# Patient Record
Sex: Female | Born: 1970 | Race: Black or African American | Hispanic: No | Smoking: Never smoker
Health system: Southern US, Community
[De-identification: ages and names within clinical notes are randomized; demographics above are authoritative.]

## PROBLEM LIST (undated history)

## (undated) DIAGNOSIS — E079 Disorder of thyroid, unspecified: Secondary | ICD-10-CM

## (undated) DIAGNOSIS — T7840XA Allergy, unspecified, initial encounter: Secondary | ICD-10-CM

## (undated) DIAGNOSIS — K219 Gastro-esophageal reflux disease without esophagitis: Secondary | ICD-10-CM

## (undated) DIAGNOSIS — I10 Essential (primary) hypertension: Secondary | ICD-10-CM

## (undated) DIAGNOSIS — Z460 Encounter for fitting and adjustment of spectacles and contact lenses: Secondary | ICD-10-CM

## (undated) DIAGNOSIS — M1990 Unspecified osteoarthritis, unspecified site: Secondary | ICD-10-CM

## (undated) HISTORY — DX: Essential (primary) hypertension: I10

## (undated) HISTORY — PX: BREAST BIOPSY: SHX20

## (undated) HISTORY — DX: Unspecified osteoarthritis, unspecified site: M19.90

## (undated) HISTORY — DX: Encounter for fitting and adjustment of spectacles and contact lenses: Z46.0

## (undated) HISTORY — DX: Disorder of thyroid, unspecified: E07.9

## (undated) HISTORY — DX: Gastro-esophageal reflux disease without esophagitis: K21.9

## (undated) HISTORY — DX: Allergy, unspecified, initial encounter: T78.40XA

## (undated) HISTORY — PX: OTHER SURGICAL HISTORY: SHX169

---

## 1997-12-07 ENCOUNTER — Inpatient Hospital Stay (HOSPITAL_COMMUNITY): Admission: AD | Admit: 1997-12-07 | Discharge: 1997-12-10 | Payer: Self-pay | Admitting: Obstetrics and Gynecology

## 2000-09-11 ENCOUNTER — Encounter (INDEPENDENT_AMBULATORY_CARE_PROVIDER_SITE_OTHER): Payer: Self-pay | Admitting: *Deleted

## 2000-09-11 ENCOUNTER — Inpatient Hospital Stay (HOSPITAL_COMMUNITY): Admission: AD | Admit: 2000-09-11 | Discharge: 2000-09-13 | Payer: Self-pay | Admitting: Obstetrics and Gynecology

## 2001-02-06 ENCOUNTER — Other Ambulatory Visit: Admission: RE | Admit: 2001-02-06 | Discharge: 2001-02-06 | Payer: Self-pay | Admitting: Obstetrics and Gynecology

## 2002-03-01 ENCOUNTER — Other Ambulatory Visit: Admission: RE | Admit: 2002-03-01 | Discharge: 2002-03-01 | Payer: Self-pay | Admitting: Obstetrics and Gynecology

## 2003-02-28 ENCOUNTER — Other Ambulatory Visit: Admission: RE | Admit: 2003-02-28 | Discharge: 2003-02-28 | Payer: Self-pay | Admitting: Obstetrics and Gynecology

## 2003-06-09 ENCOUNTER — Other Ambulatory Visit: Admission: RE | Admit: 2003-06-09 | Discharge: 2003-06-09 | Payer: Self-pay | Admitting: Obstetrics and Gynecology

## 2003-10-13 ENCOUNTER — Inpatient Hospital Stay (HOSPITAL_COMMUNITY): Admission: AD | Admit: 2003-10-13 | Discharge: 2003-10-13 | Payer: Self-pay | Admitting: Obstetrics and Gynecology

## 2003-10-27 ENCOUNTER — Inpatient Hospital Stay (HOSPITAL_COMMUNITY): Admission: AD | Admit: 2003-10-27 | Discharge: 2003-10-27 | Payer: Self-pay | Admitting: Obstetrics and Gynecology

## 2003-10-28 ENCOUNTER — Inpatient Hospital Stay (HOSPITAL_COMMUNITY): Admission: AD | Admit: 2003-10-28 | Discharge: 2003-10-28 | Payer: Self-pay | Admitting: Obstetrics and Gynecology

## 2003-12-16 ENCOUNTER — Inpatient Hospital Stay (HOSPITAL_COMMUNITY): Admission: RE | Admit: 2003-12-16 | Discharge: 2003-12-19 | Payer: Self-pay | Admitting: Obstetrics and Gynecology

## 2004-01-30 ENCOUNTER — Other Ambulatory Visit: Admission: RE | Admit: 2004-01-30 | Discharge: 2004-01-30 | Payer: Self-pay | Admitting: Obstetrics and Gynecology

## 2005-12-19 ENCOUNTER — Ambulatory Visit: Payer: Self-pay | Admitting: Family Medicine

## 2006-01-22 ENCOUNTER — Ambulatory Visit: Payer: Self-pay | Admitting: Family Medicine

## 2006-01-22 LAB — CONVERTED CEMR LAB
BUN: 22 mg/dL (ref 6–23)
Calcium: 9.8 mg/dL (ref 8.4–10.5)
Chol/HDL Ratio, serum: 4.5
Creatinine, Ser: 1.2 mg/dL (ref 0.4–1.2)
Glomerular Filtration Rate, Af Am: 66 mL/min/{1.73_m2}
HCT: 37.8 % (ref 36.0–46.0)
LDL DIRECT: 219.3 mg/dL
Potassium: 4.7 meq/L (ref 3.5–5.1)
RDW: 14.3 % (ref 11.5–14.6)
TSH: >100 microintl units/mL — ABNORMAL HIGH (ref 0.35–5.50)
Triglyceride fasting, serum: 101 mg/dL (ref 0–149)
VLDL: 20 mg/dL (ref 0–40)
WBC: 5.3 10*3/uL (ref 4.5–10.5)

## 2006-01-30 ENCOUNTER — Ambulatory Visit: Payer: Self-pay | Admitting: Family Medicine

## 2006-01-30 LAB — CONVERTED CEMR LAB
Free T4: 0.3 ng/dL — ABNORMAL LOW (ref 0.9–1.8)
T3, Free: 2.6 pg/mL (ref 2.3–4.2)
TSH: >100 microintl units/mL — ABNORMAL HIGH (ref 0.35–5.50)

## 2006-02-17 ENCOUNTER — Ambulatory Visit: Payer: Self-pay | Admitting: Family Medicine

## 2006-02-17 LAB — CONVERTED CEMR LAB
Albumin: 4.1 g/dL (ref 3.5–5.2)
HDL: 59.9 mg/dL (ref >39.0)
Triglyceride fasting, serum: 102 mg/dL (ref 0–149)

## 2006-02-20 ENCOUNTER — Encounter: Admission: RE | Admit: 2006-02-20 | Discharge: 2006-02-20 | Payer: Self-pay | Admitting: Family Medicine

## 2006-03-31 ENCOUNTER — Ambulatory Visit: Payer: Self-pay | Admitting: Family Medicine

## 2006-05-09 ENCOUNTER — Ambulatory Visit: Payer: Self-pay | Admitting: Family Medicine

## 2006-05-09 LAB — CONVERTED CEMR LAB
Cholesterol: 191 mg/dL (ref 0–200)
Total CHOL/HDL Ratio: 4.3

## 2006-05-16 ENCOUNTER — Ambulatory Visit: Payer: Self-pay | Admitting: Internal Medicine

## 2006-08-13 DIAGNOSIS — I10 Essential (primary) hypertension: Secondary | ICD-10-CM

## 2006-09-03 ENCOUNTER — Ambulatory Visit: Payer: Self-pay | Admitting: Family Medicine

## 2006-09-03 DIAGNOSIS — E039 Hypothyroidism, unspecified: Secondary | ICD-10-CM

## 2006-09-03 DIAGNOSIS — J309 Allergic rhinitis, unspecified: Secondary | ICD-10-CM | POA: Insufficient documentation

## 2006-09-03 DIAGNOSIS — E669 Obesity, unspecified: Secondary | ICD-10-CM

## 2006-10-22 ENCOUNTER — Ambulatory Visit: Payer: Self-pay | Admitting: Family Medicine

## 2006-10-23 ENCOUNTER — Telehealth (INDEPENDENT_AMBULATORY_CARE_PROVIDER_SITE_OTHER): Payer: Self-pay | Admitting: *Deleted

## 2006-10-23 LAB — CONVERTED CEMR LAB
AST: 17 units/L (ref 0–37)
BUN: 12 mg/dL (ref 6–23)
CO2: 28 meq/L (ref 19–32)
Creatinine, Ser: 0.7 mg/dL (ref 0.4–1.2)
HDL: 52.3 mg/dL (ref >39.0)
Potassium: 3.9 meq/L (ref 3.5–5.1)
TSH: 5.26 microintl units/mL (ref 0.35–5.50)

## 2006-12-17 ENCOUNTER — Encounter (INDEPENDENT_AMBULATORY_CARE_PROVIDER_SITE_OTHER): Payer: Self-pay | Admitting: Family Medicine

## 2006-12-17 ENCOUNTER — Telehealth (INDEPENDENT_AMBULATORY_CARE_PROVIDER_SITE_OTHER): Payer: Self-pay | Admitting: *Deleted

## 2007-01-19 ENCOUNTER — Telehealth (INDEPENDENT_AMBULATORY_CARE_PROVIDER_SITE_OTHER): Payer: Self-pay | Admitting: *Deleted

## 2007-01-22 ENCOUNTER — Ambulatory Visit: Payer: Self-pay | Admitting: Family Medicine

## 2007-01-23 ENCOUNTER — Encounter (INDEPENDENT_AMBULATORY_CARE_PROVIDER_SITE_OTHER): Payer: Self-pay | Admitting: Family Medicine

## 2007-01-25 LAB — CONVERTED CEMR LAB
CO2: 29 meq/L (ref 19–32)
Chloride: 102 meq/L (ref 96–112)
Creatinine, Ser: 0.9 mg/dL (ref 0.4–1.2)
Glucose, Bld: 88 mg/dL (ref 70–99)
Sodium: 138 meq/L (ref 135–145)

## 2007-01-26 ENCOUNTER — Encounter (INDEPENDENT_AMBULATORY_CARE_PROVIDER_SITE_OTHER): Payer: Self-pay | Admitting: *Deleted

## 2007-01-26 ENCOUNTER — Telehealth (INDEPENDENT_AMBULATORY_CARE_PROVIDER_SITE_OTHER): Payer: Self-pay | Admitting: *Deleted

## 2007-02-03 ENCOUNTER — Encounter (INDEPENDENT_AMBULATORY_CARE_PROVIDER_SITE_OTHER): Payer: Self-pay | Admitting: Family Medicine

## 2007-02-18 ENCOUNTER — Telehealth (INDEPENDENT_AMBULATORY_CARE_PROVIDER_SITE_OTHER): Payer: Self-pay | Admitting: *Deleted

## 2007-02-20 ENCOUNTER — Encounter: Admission: RE | Admit: 2007-02-20 | Discharge: 2007-05-21 | Payer: Self-pay | Admitting: Surgery

## 2007-02-20 ENCOUNTER — Ambulatory Visit (HOSPITAL_COMMUNITY): Admission: RE | Admit: 2007-02-20 | Discharge: 2007-02-20 | Payer: Self-pay | Admitting: Surgery

## 2007-03-01 ENCOUNTER — Ambulatory Visit (HOSPITAL_BASED_OUTPATIENT_CLINIC_OR_DEPARTMENT_OTHER): Admission: RE | Admit: 2007-03-01 | Discharge: 2007-03-01 | Payer: Self-pay | Admitting: Surgery

## 2007-03-02 ENCOUNTER — Ambulatory Visit (HOSPITAL_COMMUNITY): Admission: RE | Admit: 2007-03-02 | Discharge: 2007-03-02 | Payer: Self-pay | Admitting: Surgery

## 2007-03-07 ENCOUNTER — Ambulatory Visit: Payer: Self-pay | Admitting: Internal Medicine

## 2007-04-02 ENCOUNTER — Ambulatory Visit: Payer: Self-pay | Admitting: Family Medicine

## 2007-04-02 DIAGNOSIS — IMO0002 Reserved for concepts with insufficient information to code with codable children: Secondary | ICD-10-CM

## 2007-04-03 ENCOUNTER — Encounter (INDEPENDENT_AMBULATORY_CARE_PROVIDER_SITE_OTHER): Payer: Self-pay | Admitting: *Deleted

## 2007-04-03 LAB — CONVERTED CEMR LAB
CO2: 31 meq/L (ref 19–32)
Creatinine, Ser: 0.7 mg/dL (ref 0.4–1.2)
GFR calc Af Amer: 122 mL/min
GFR calc non Af Amer: 101 mL/min
Potassium: 4.6 meq/L (ref 3.5–5.1)
Sodium: 139 meq/L (ref 135–145)

## 2007-04-15 ENCOUNTER — Telehealth (INDEPENDENT_AMBULATORY_CARE_PROVIDER_SITE_OTHER): Payer: Self-pay | Admitting: *Deleted

## 2007-05-06 ENCOUNTER — Ambulatory Visit: Payer: Self-pay | Admitting: Family Medicine

## 2007-05-12 ENCOUNTER — Encounter (INDEPENDENT_AMBULATORY_CARE_PROVIDER_SITE_OTHER): Payer: Self-pay | Admitting: *Deleted

## 2007-05-12 LAB — CONVERTED CEMR LAB
BUN: 10 mg/dL (ref 6–23)
CO2: 31 meq/L (ref 19–32)
Calcium: 9.8 mg/dL (ref 8.4–10.5)
Chloride: 99 meq/L (ref 96–112)
Creatinine, Ser: 0.7 mg/dL (ref 0.4–1.2)
GFR calc Af Amer: 122 mL/min
GFR calc non Af Amer: 101 mL/min
Glucose, Bld: 86 mg/dL (ref 70–99)
Potassium: 3.8 meq/L (ref 3.5–5.1)
Sodium: 137 meq/L (ref 135–145)

## 2007-05-26 ENCOUNTER — Encounter: Admission: RE | Admit: 2007-05-26 | Discharge: 2007-08-24 | Payer: Self-pay | Admitting: Surgery

## 2007-06-15 ENCOUNTER — Ambulatory Visit: Payer: Self-pay | Admitting: Family Medicine

## 2007-07-20 ENCOUNTER — Ambulatory Visit: Payer: Self-pay | Admitting: Internal Medicine

## 2007-08-24 ENCOUNTER — Ambulatory Visit: Payer: Self-pay | Admitting: Family Medicine

## 2007-08-24 ENCOUNTER — Telehealth (INDEPENDENT_AMBULATORY_CARE_PROVIDER_SITE_OTHER): Payer: Self-pay | Admitting: *Deleted

## 2007-08-24 DIAGNOSIS — E785 Hyperlipidemia, unspecified: Secondary | ICD-10-CM

## 2007-08-26 ENCOUNTER — Ambulatory Visit: Payer: Self-pay | Admitting: Family Medicine

## 2007-09-01 LAB — CONVERTED CEMR LAB
AST: 21 units/L (ref 0–37)
Albumin: 3.6 g/dL (ref 3.5–5.2)
BUN: 18 mg/dL (ref 6–23)
CO2: 31 meq/L (ref 19–32)
Chloride: 103 meq/L (ref 96–112)
Cholesterol: 209 mg/dL (ref 0–200)
Creatinine, Ser: 0.8 mg/dL (ref 0.4–1.2)
Direct LDL: 141 mg/dL
Glucose, Bld: 96 mg/dL (ref 70–99)
Potassium: 3.9 meq/L (ref 3.5–5.1)
TSH: 18.23 microintl units/mL — ABNORMAL HIGH (ref 0.35–5.50)

## 2007-09-02 ENCOUNTER — Encounter (INDEPENDENT_AMBULATORY_CARE_PROVIDER_SITE_OTHER): Payer: Self-pay | Admitting: *Deleted

## 2007-09-03 ENCOUNTER — Telehealth (INDEPENDENT_AMBULATORY_CARE_PROVIDER_SITE_OTHER): Payer: Self-pay | Admitting: *Deleted

## 2007-09-17 ENCOUNTER — Telehealth (INDEPENDENT_AMBULATORY_CARE_PROVIDER_SITE_OTHER): Payer: Self-pay | Admitting: *Deleted

## 2007-09-28 ENCOUNTER — Encounter: Admission: RE | Admit: 2007-09-28 | Discharge: 2007-10-29 | Payer: Self-pay | Admitting: Surgery

## 2007-09-30 ENCOUNTER — Encounter: Payer: Self-pay | Admitting: Family Medicine

## 2007-10-12 ENCOUNTER — Ambulatory Visit (HOSPITAL_COMMUNITY): Admission: RE | Admit: 2007-10-12 | Discharge: 2007-10-13 | Payer: Self-pay | Admitting: Surgery

## 2007-10-12 HISTORY — PX: LAPAROSCOPIC GASTRIC BANDING: SHX1100

## 2007-10-14 IMAGING — US US SOFT TISSUE HEAD/NECK
1 series · 14 of 25 positions shown · non-contrast
Comparison: none

CLINICAL DATA: Elevated TSH

[Series 1: unknown · 0.09mm/px · 14 of 33 slices shown]
[im 1/33]
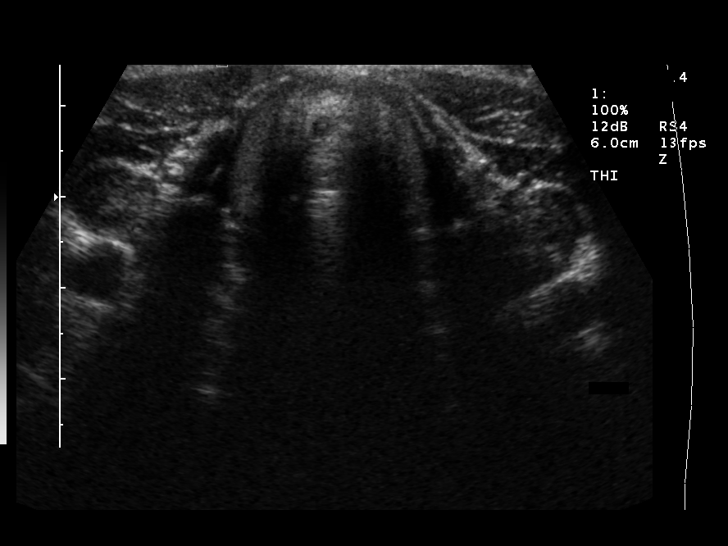
[im 3/33]
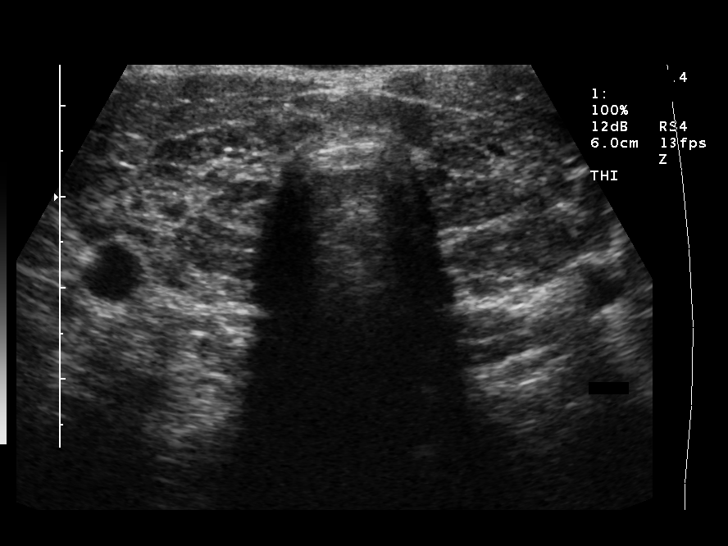
[im 6/33]
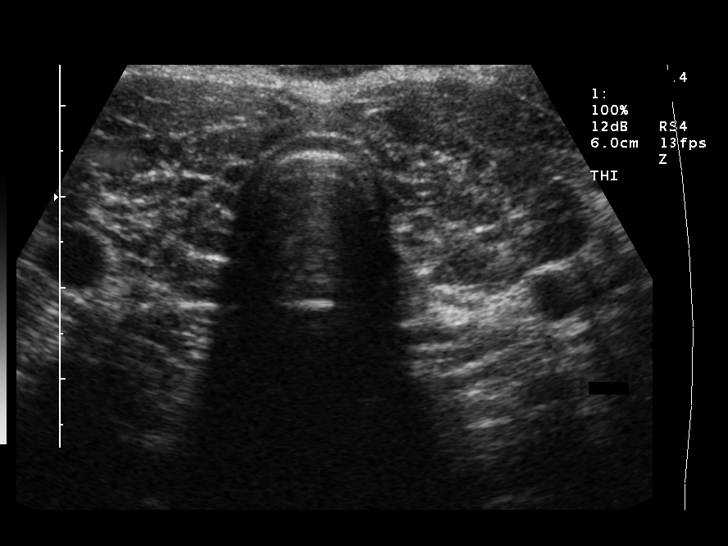
[im 9/33]
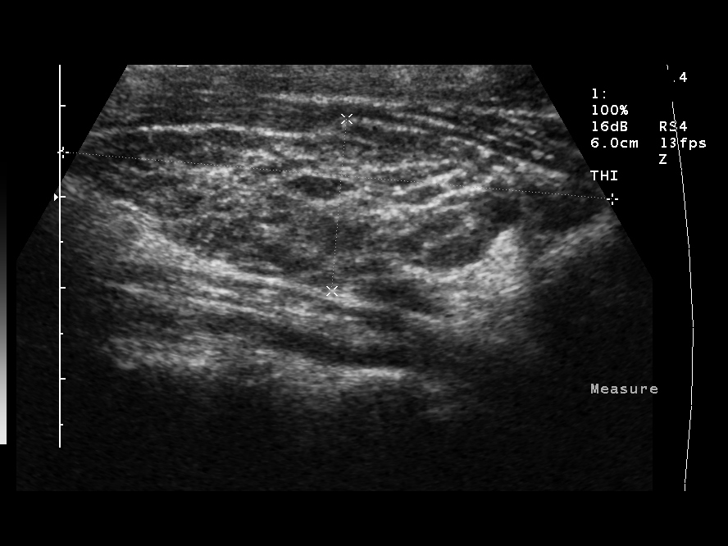
[im 11/33]
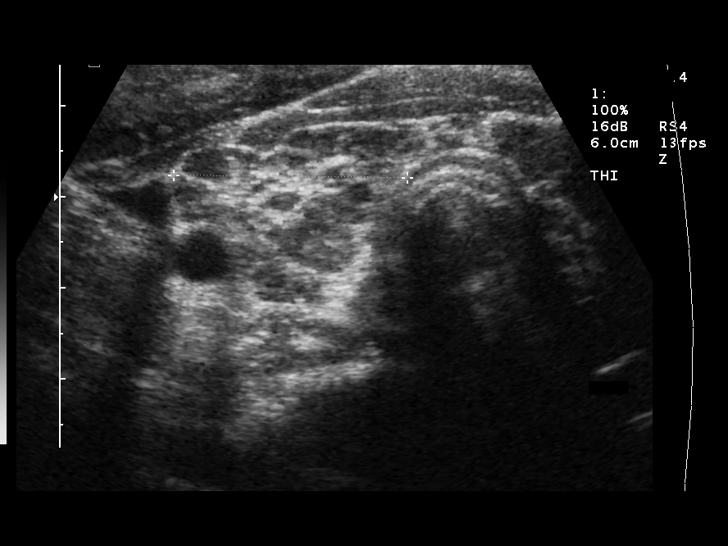
[im 13/33]
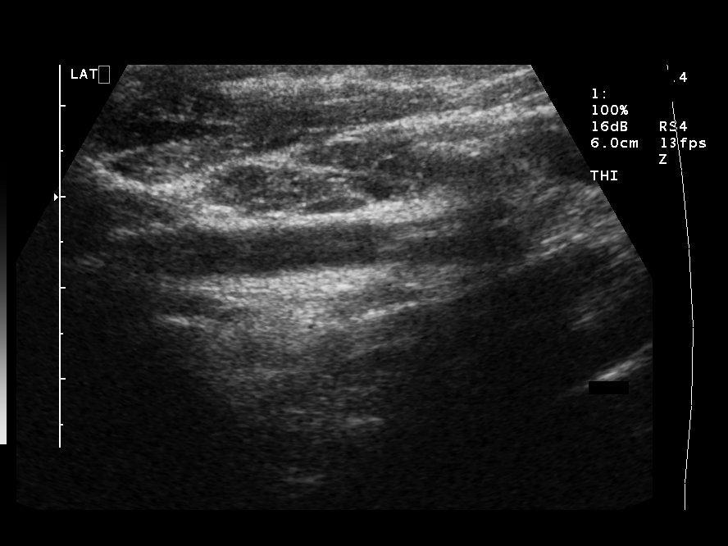
[im 15/33]
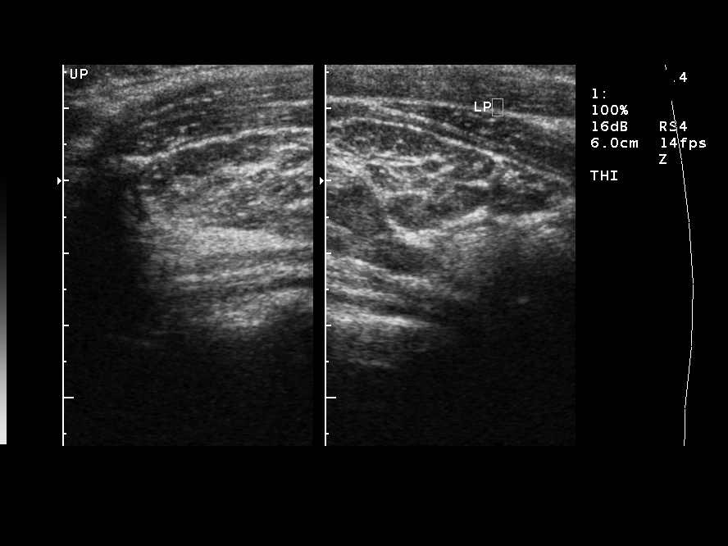
[im 18/33]
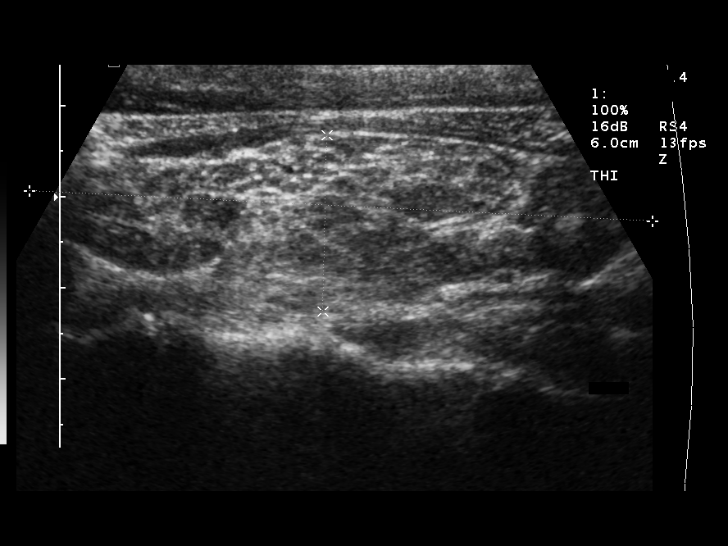
[im 21/33]
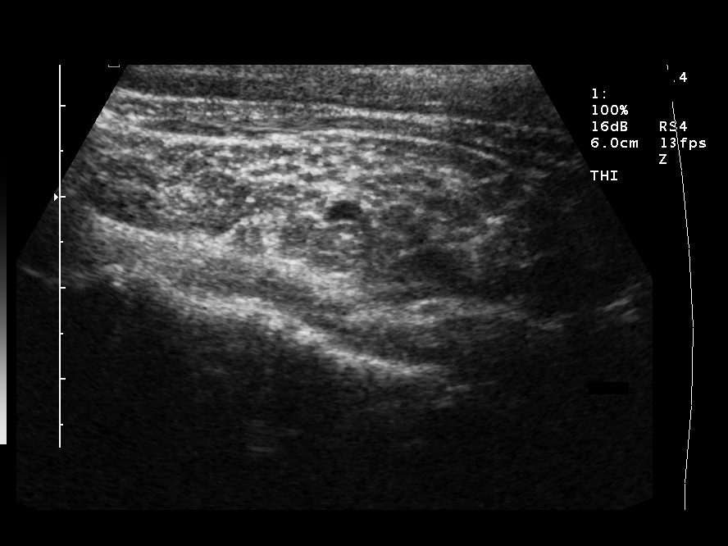
[im 22/33]
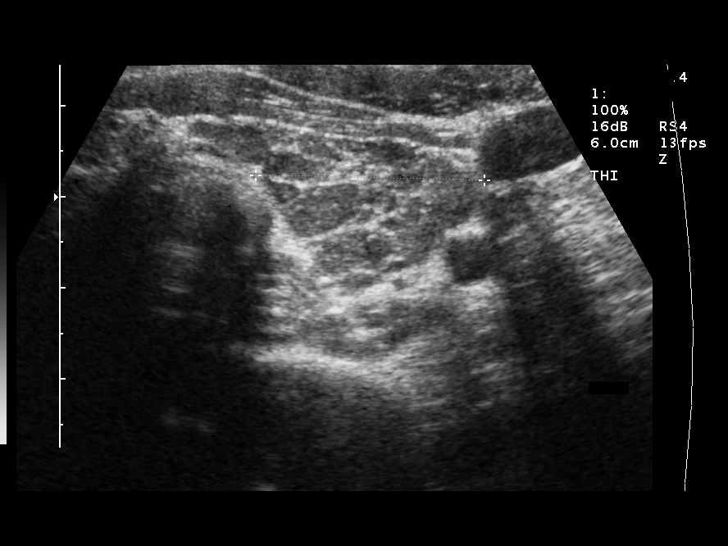
[im 25/33]
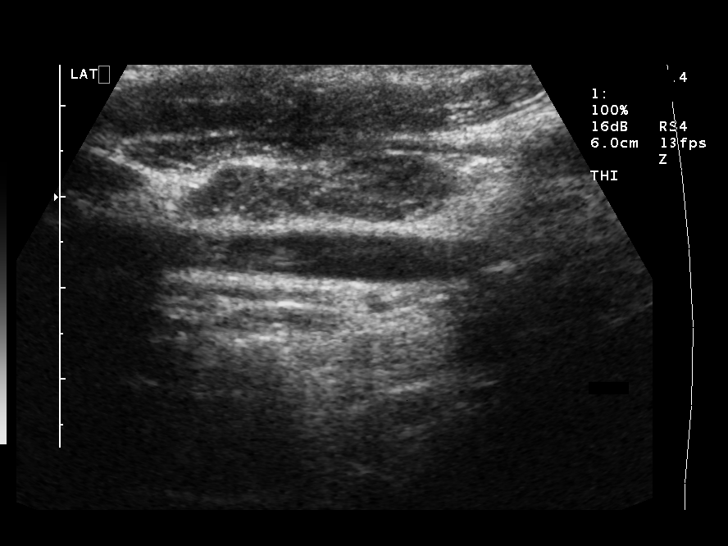
[im 27/33]
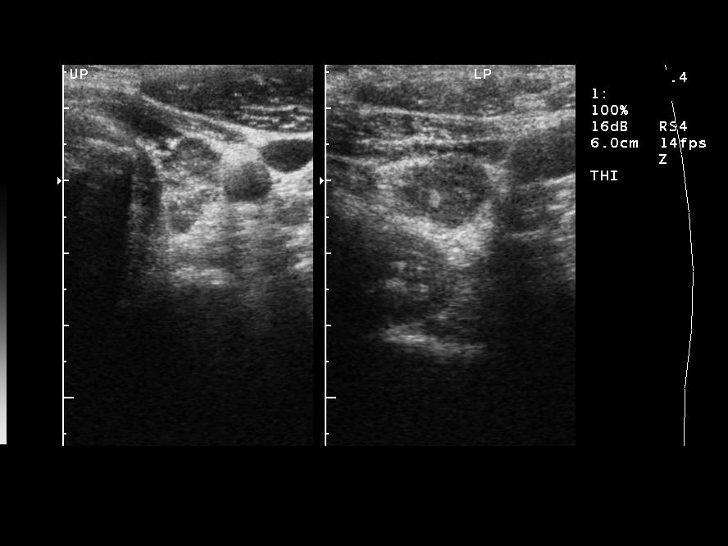
[im 30/33]
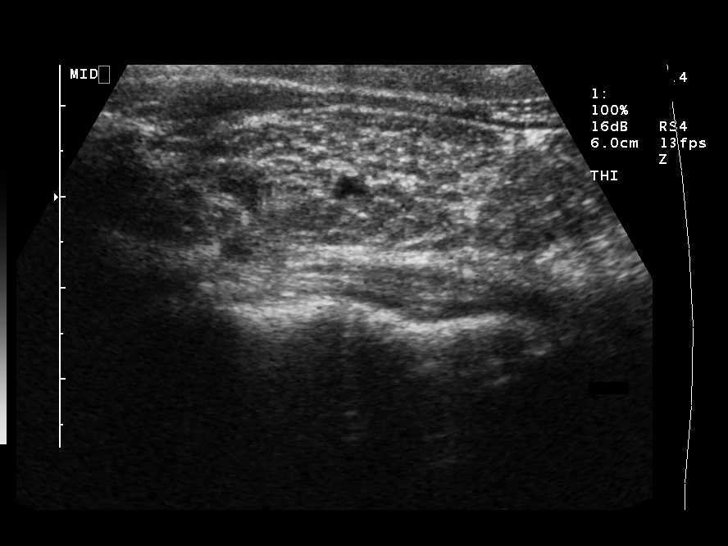
[im 33/33]
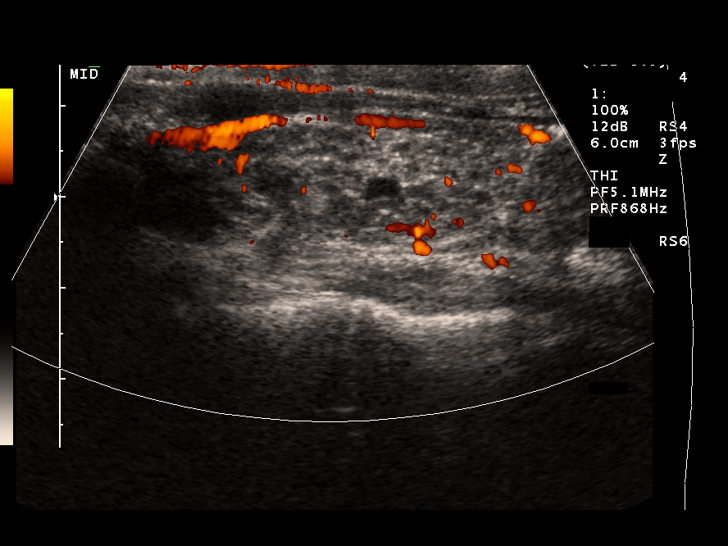

[14 of 25 positions shown; findings below may reference images not displayed]

Thyroid ultrasound:

No previous for comparison. The right lobe measures 19 x 26 x 61 mm, markedly
inhomogeneous in echotexture without discrete mass. Isthmus 5 mm in total
thickness. Left lobe 18 x 25 x 66 mm, similarly inhomogeneous diffusely. There
is a 2 x 3 x 3 mm cystic lesion in its midportion.
IMPRESSION: 1. Diffusely heterogeneous thyroid parenchyma without discrete or dominant mass

## 2007-10-29 ENCOUNTER — Encounter: Payer: Self-pay | Admitting: Family Medicine

## 2007-11-26 ENCOUNTER — Ambulatory Visit: Payer: Self-pay | Admitting: Family Medicine

## 2007-12-21 ENCOUNTER — Encounter: Payer: Self-pay | Admitting: Family Medicine

## 2008-06-08 ENCOUNTER — Ambulatory Visit: Payer: Self-pay | Admitting: Family Medicine

## 2008-06-22 ENCOUNTER — Ambulatory Visit: Payer: Self-pay | Admitting: Family Medicine

## 2008-07-01 ENCOUNTER — Encounter: Payer: Self-pay | Admitting: Family Medicine

## 2008-08-12 ENCOUNTER — Ambulatory Visit: Payer: Self-pay | Admitting: Family Medicine

## 2008-08-12 DIAGNOSIS — J069 Acute upper respiratory infection, unspecified: Secondary | ICD-10-CM | POA: Insufficient documentation

## 2008-09-28 ENCOUNTER — Telehealth (INDEPENDENT_AMBULATORY_CARE_PROVIDER_SITE_OTHER): Payer: Self-pay | Admitting: *Deleted

## 2008-09-30 ENCOUNTER — Ambulatory Visit: Payer: Self-pay | Admitting: Family Medicine

## 2008-10-03 ENCOUNTER — Encounter (INDEPENDENT_AMBULATORY_CARE_PROVIDER_SITE_OTHER): Payer: Self-pay | Admitting: *Deleted

## 2008-10-03 LAB — CONVERTED CEMR LAB
Bilirubin, Direct: 0 mg/dL (ref 0.0–0.3)
Calcium: 9.5 mg/dL (ref 8.4–10.5)
GFR calc non Af Amer: 121.15 mL/min (ref >60)
Glucose, Bld: 77 mg/dL (ref 70–99)
HDL: 49.6 mg/dL (ref >39.00)
LDL Cholesterol: 120 mg/dL — ABNORMAL HIGH (ref 0–99)
Sodium: 142 meq/L (ref 135–145)
Total Bilirubin: 0.8 mg/dL (ref 0.3–1.2)
Total Protein: 7 g/dL (ref 6.0–8.3)
VLDL: 14 mg/dL (ref 0.0–40.0)

## 2008-12-09 ENCOUNTER — Ambulatory Visit: Payer: Self-pay | Admitting: Family Medicine

## 2009-01-05 ENCOUNTER — Ambulatory Visit: Payer: Self-pay | Admitting: Family Medicine

## 2009-03-16 ENCOUNTER — Ambulatory Visit: Payer: Self-pay | Admitting: Family Medicine

## 2009-03-16 DIAGNOSIS — N390 Urinary tract infection, site not specified: Secondary | ICD-10-CM

## 2009-03-16 LAB — CONVERTED CEMR LAB
Bilirubin Urine: NEGATIVE
Protein, U semiquant: NEGATIVE
Urobilinogen, UA: 0.2

## 2009-03-18 ENCOUNTER — Encounter: Payer: Self-pay | Admitting: Family Medicine

## 2009-03-22 ENCOUNTER — Telehealth (INDEPENDENT_AMBULATORY_CARE_PROVIDER_SITE_OTHER): Payer: Self-pay | Admitting: *Deleted

## 2009-05-02 ENCOUNTER — Ambulatory Visit: Payer: Self-pay | Admitting: Family Medicine

## 2009-05-02 DIAGNOSIS — B009 Herpesviral infection, unspecified: Secondary | ICD-10-CM | POA: Insufficient documentation

## 2009-05-02 DIAGNOSIS — T148XXA Other injury of unspecified body region, initial encounter: Secondary | ICD-10-CM | POA: Insufficient documentation

## 2009-05-02 DIAGNOSIS — T783XXA Angioneurotic edema, initial encounter: Secondary | ICD-10-CM | POA: Insufficient documentation

## 2009-05-03 ENCOUNTER — Encounter: Payer: Self-pay | Admitting: Family Medicine

## 2009-05-31 ENCOUNTER — Ambulatory Visit: Payer: Self-pay | Admitting: Family Medicine

## 2009-05-31 DIAGNOSIS — J029 Acute pharyngitis, unspecified: Secondary | ICD-10-CM

## 2009-06-01 ENCOUNTER — Encounter: Payer: Self-pay | Admitting: Family Medicine

## 2009-06-05 ENCOUNTER — Ambulatory Visit: Payer: Self-pay | Admitting: Family Medicine

## 2009-06-05 IMAGING — CR DG ABDOMEN 1V
2 series · 2 of 2 positions shown · non-contrast
Comparison: 02/20/2007

CLINICAL DATA: Morbid obesity - post gastric banding

ABDOMEN - 1 VIEW

[t abdomen supine (1 of 2)]
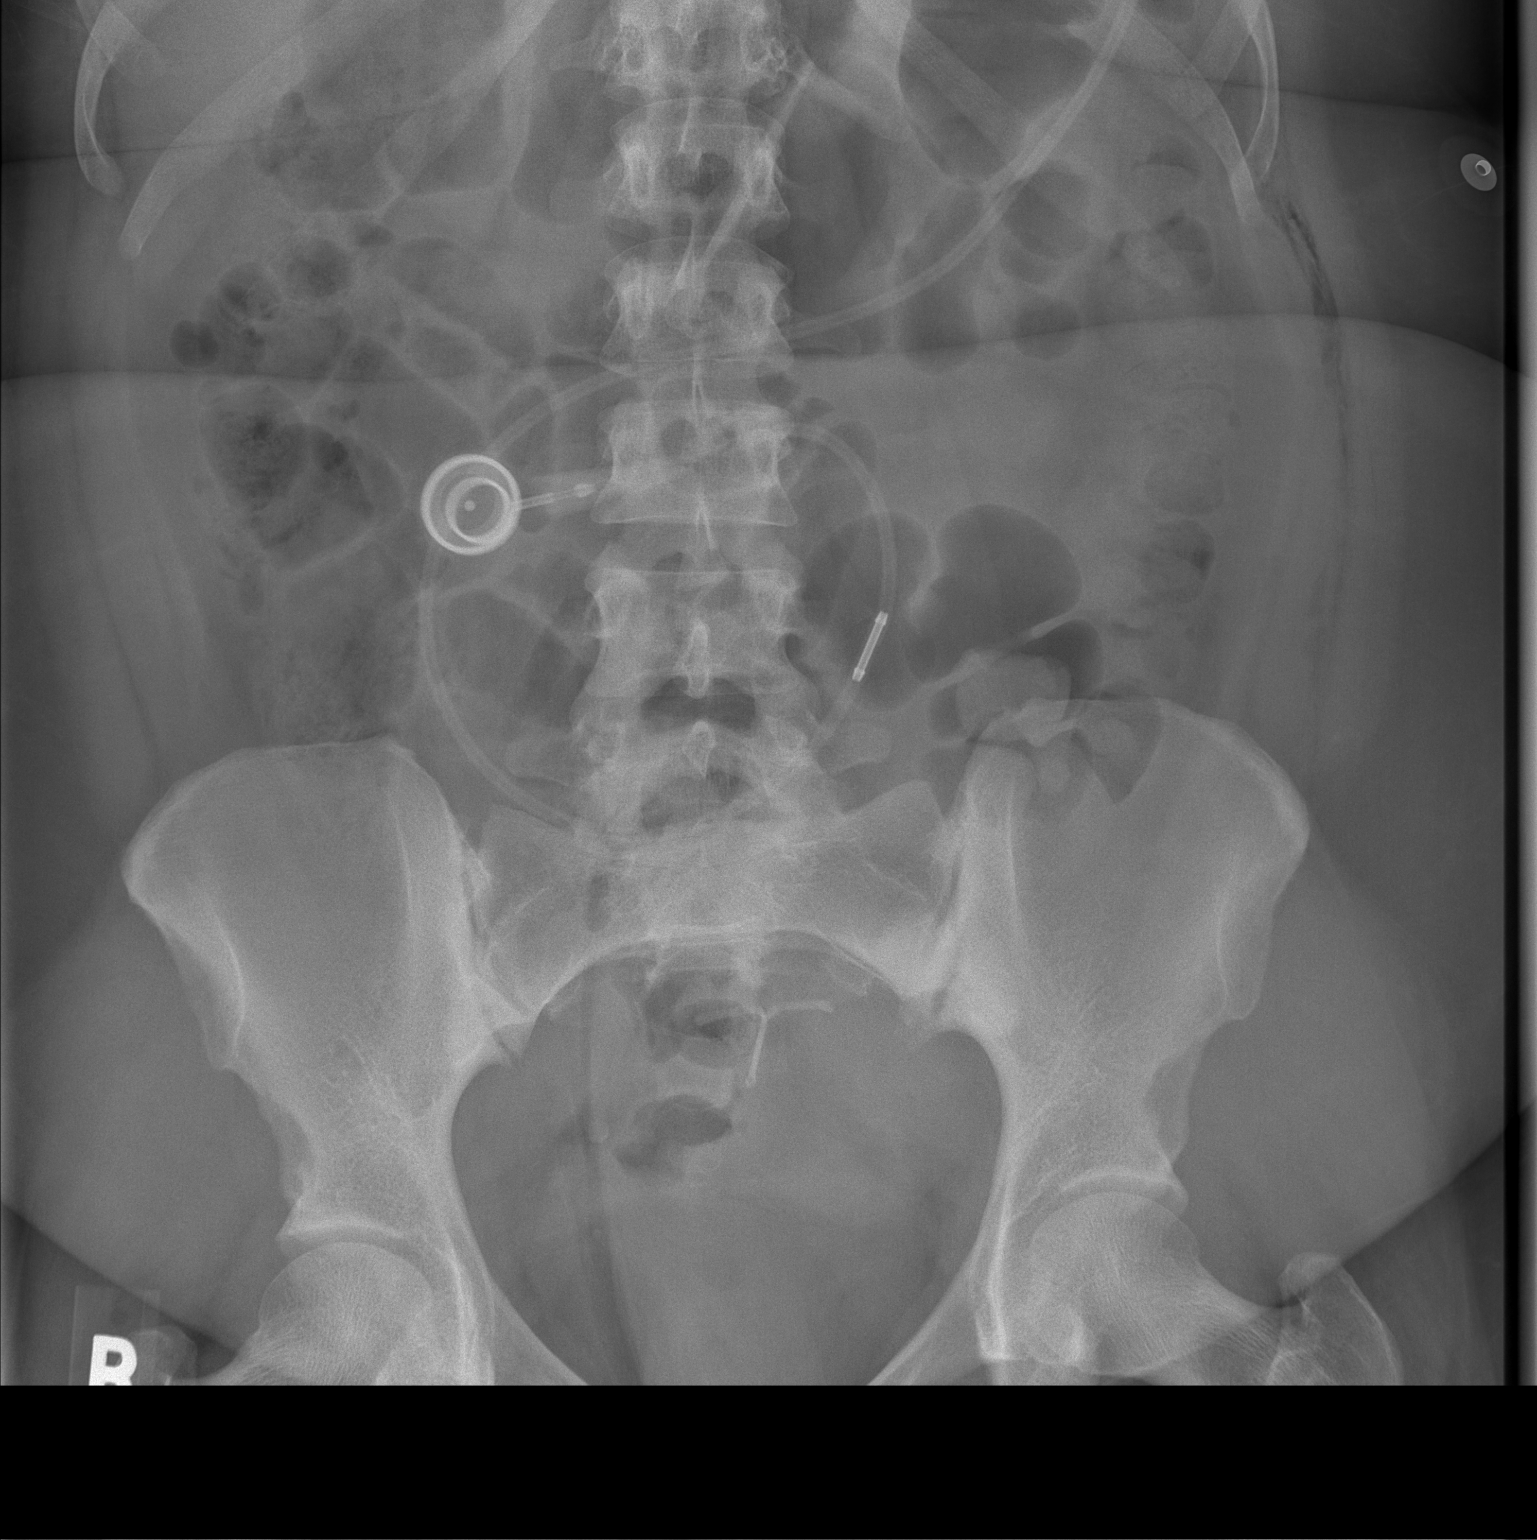

[t abdomen supine (2 of 2)]
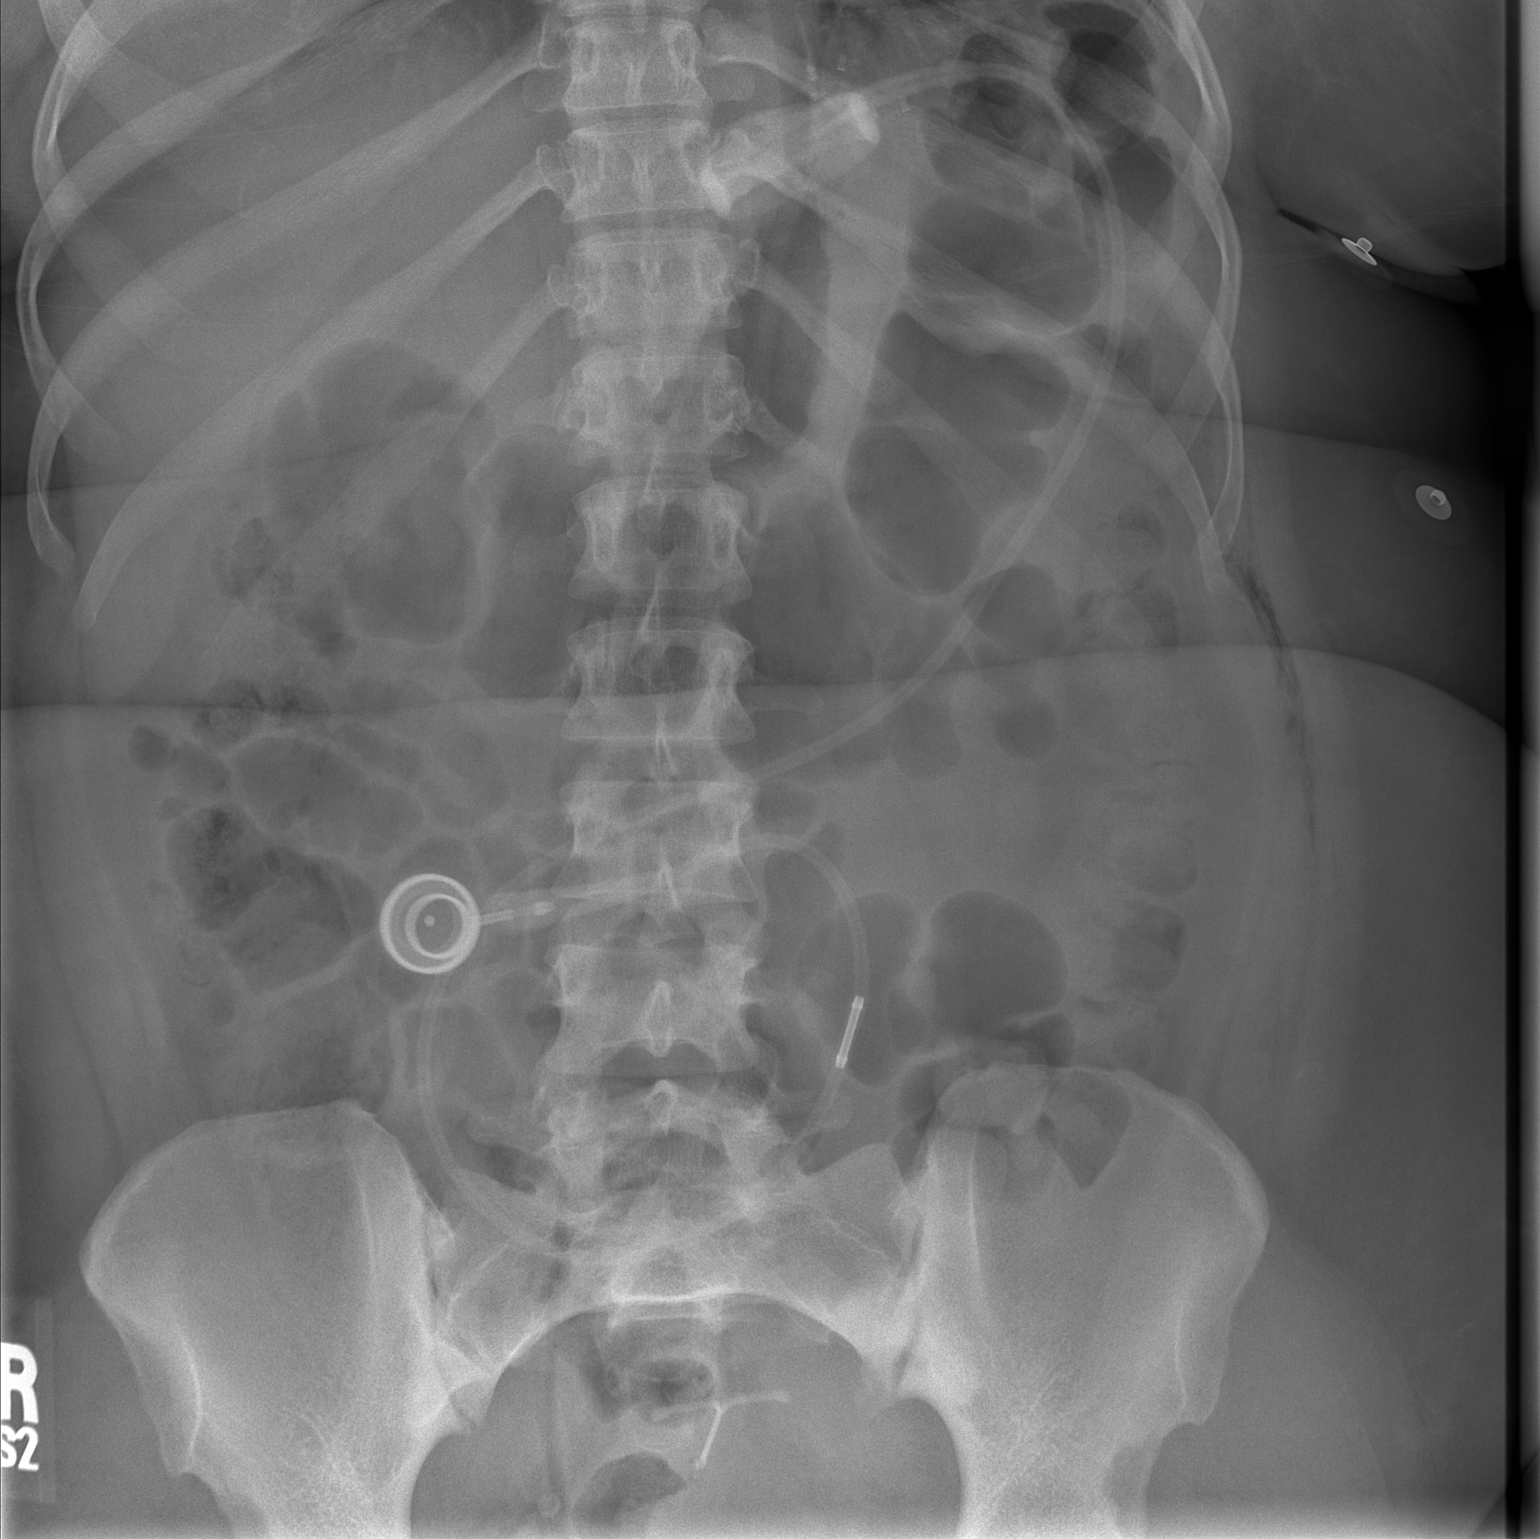

[2 of 2 positions shown; findings below may reference images not displayed]

FINDINGS: Status post gastric banding with customary apparatus in
place.  The axis of the band is approximately 230-730 o'clock.
Tubing appears to be intact.
IMPRESSION: Status post gastric banding as described above.

## 2009-09-14 ENCOUNTER — Ambulatory Visit: Payer: Self-pay | Admitting: Family Medicine

## 2009-09-15 ENCOUNTER — Encounter: Payer: Self-pay | Admitting: Family Medicine

## 2009-10-20 ENCOUNTER — Ambulatory Visit: Payer: Self-pay | Admitting: Family Medicine

## 2009-10-20 LAB — CONVERTED CEMR LAB
Bilirubin Urine: NEGATIVE
Ketones, urine, test strip: NEGATIVE
Urobilinogen, UA: 0.2
pH: 7.5

## 2009-10-21 ENCOUNTER — Encounter: Payer: Self-pay | Admitting: Family Medicine

## 2009-10-23 ENCOUNTER — Telehealth (INDEPENDENT_AMBULATORY_CARE_PROVIDER_SITE_OTHER): Payer: Self-pay | Admitting: *Deleted

## 2009-10-27 ENCOUNTER — Telehealth: Payer: Self-pay | Admitting: Family Medicine

## 2009-12-18 ENCOUNTER — Encounter: Payer: Self-pay | Admitting: Family Medicine

## 2010-01-02 HISTORY — PX: ABDOMINAL HYSTERECTOMY: SHX81

## 2010-01-30 ENCOUNTER — Encounter (INDEPENDENT_AMBULATORY_CARE_PROVIDER_SITE_OTHER): Payer: Self-pay | Admitting: Obstetrics and Gynecology

## 2010-01-30 ENCOUNTER — Inpatient Hospital Stay (HOSPITAL_COMMUNITY): Admission: RE | Admit: 2010-01-30 | Discharge: 2010-02-02 | Payer: Self-pay | Admitting: Obstetrics and Gynecology

## 2010-04-05 NOTE — Assessment & Plan Note (Signed)
Summary: allergic reaction to meds/kdc   Vital Signs:  Patient profile:   40 year old female Weight:      205 pounds Temp:     98.8 degrees F oral Pulse rate:   84 / minute Pulse rhythm:   regular BP sitting:   122 / 80  (left arm) Cuff size:   large  Vitals Entered By: Army Fossa CMA (June 05, 2009 11:23 AM) CC: Pt here she took the Fluconazole on saturday and she said within 2 mins, she started itching, lips and tongue started swelling. She has been taking benadrly.   History of Present Illness: Pt had same reaction as last time and said it must have be the diflucan and not the ACEI.  The reaction was almost immediate and it is not getting better.    Allergies: 1)  ! * Fluconzaole 2)  Penicillin G Potassium (Penicillin G Potassium)  Past History:  Past medical, surgical, family and social histories (including risk factors) reviewed for relevance to current acute and chronic problems.  Past Medical History: Reviewed history from 08/24/2007 and no changes required. Hypertension Allergy Thyroid nodule Hyperlipidemia Current Problems:  HYPERLIPIDEMIA (ICD-272.4) SHOULDER STRAIN (ICD-840.9) OBESITY (ICD-278.00) HYPOTHYROIDISM (ICD-244.9) ALLERGIC RHINITIS, HX OF (ICD-477.9) HYPERTENSION (ICD-401.9)  Family History: Reviewed history and no changes required.  Social History: Reviewed history from 08/13/2006 and no changes required. Never Smoked Alcohol use-no Drug use-no  Review of Systems      See HPI  Physical Exam  General:  Well-developed,well-nourished,in no acute distress; alert,appropriate and cooperative throughout examination Mouth:  + lips swollen some blisters pharynx pink and moist and no posterior lymphoid hypertrophy.   Neck:  No deformities, masses, or tenderness noted. Lungs:  Normal respiratory effort, chest expands symmetrically. Lungs are clear to auscultation, no crackles or wheezes. Heart:  normal rate and no murmur.   Psych:   Oriented X3 and normally interactive.     Impression & Recommendations:  Problem # 1:  ANGIOEDEMA (ICD-995.1)  from diflucan prednisone taper benadryl as needed   Orders: Admin of Therapeutic Inj  intramuscular or subcutaneous (16109) Depo- Medrol 80mg  (J1040)  Complete Medication List: 1)  Synthroid 112 Mcg Tabs (Levothyroxine sodium) .... Take one tablet daily 2)  Norvasc 10 Mg Tabs (Amlodipine besylate) .Marland Kitchen.. 1 by mouth once daily 3)  Fluticasone Propionate 50 Mcg/act Susp (Fluticasone propionate) .... 2 sprays each nostril once daily 4)  Seasonique 0.15-0.03 &0.01 Mg Tabs (Levonorgest-eth estrad 91-day) 5)  Prednisone 10 Mg Tabs (Prednisone) .... 3 by mouth once daily for 3 days then 2 by mouth once daily for 3 days then 1 by mouth once daily for 3 days Prescriptions: PREDNISONE 10 MG TABS (PREDNISONE) 3 by mouth once daily for 3 days then 2 by mouth once daily for 3 days then 1 by mouth once daily for 3 days  #18 x 0   Entered and Authorized by:   Loreen Freud DO   Signed by:   Loreen Freud DO on 06/05/2009   Method used:   [facility] to        [facility] [phone][facility] (retail)       [address]       [place], [place]  [address]       Ph: [phone] or [phone]       Fax: [phone]   RxID:   [id number]    Medication Administration  Injection # 1:    Medication: Depo- Medrol 80mg   Diagnosis: ANGIOEDEMA (ICD-995.1)    Route: IM    Site: RUOQ gluteus    Exp Date: 01/2010    Lot #: [id number]    Mfr: novaplus    Patient tolerated injection without complications    Given by: Army Fossa CMA (June 05, 2009 12:46 PM)  Orders Added: 1)  Est. Patient Level III [65784] 2)  Admin of Therapeutic Inj  intramuscular or subcutaneous [96372] 3)  Depo- Medrol 80mg  [J1040]

## 2010-04-05 NOTE — Assessment & Plan Note (Signed)
Summary: strep throat?/kdc   Vital Signs:  Patient profile:   40 year old female Weight:      208 pounds Temp:     98.6 degrees F oral Pulse rate:   82 / minute BP sitting:   118 / 80  (left arm)  Vitals Entered By: Jeremy Johann CMA (May 31, 2009 10:47 AM) CC: sore throat Comments REVIEWED MED LIST, PATIENT AGREED DOSE AND INSTRUCTION CORRECT    History of Present Illness: Pt here c/o ST since this am.  Her child was dx with strep yesterday.  No fever, no congestion etc.   Current Medications (verified): 1)  Synthroid 112 Mcg  Tabs (Levothyroxine Sodium) .... Take One Tablet Daily 2)  Norvasc 10 Mg Tabs (Amlodipine Besylate) .Marland Kitchen.. 1 By Mouth Once Daily 3)  Fluticasone Propionate 50 Mcg/act  Susp (Fluticasone Propionate) .... 2 Sprays Each Nostril Once Daily 4)  Seasonique 0.15-0.03 &0.01 Mg Tabs (Levonorgest-Eth Estrad 91-Day) 5)  Zithromax Z-Pak 250 Mg Tabs (Azithromycin) .... As Directed 6)  Fluconazole 150 Mg Tabs (Fluconazole) .Marland Kitchen.. 1 By Mouth Once Daily X1,  May Repeat in 3 Days As Needed  Allergies: 1)  Penicillin G Potassium (Penicillin G Potassium)  Past History:  Past medical, surgical, family and social histories (including risk factors) reviewed for relevance to current acute and chronic problems.  Past Medical History: Reviewed history from 08/24/2007 and no changes required. Hypertension Allergy Thyroid nodule Hyperlipidemia Current Problems:  HYPERLIPIDEMIA (ICD-272.4) SHOULDER STRAIN (ICD-840.9) OBESITY (ICD-278.00) HYPOTHYROIDISM (ICD-244.9) ALLERGIC RHINITIS, HX OF (ICD-477.9) HYPERTENSION (ICD-401.9)  Family History: Reviewed history and no changes required.  Social History: Reviewed history from 08/13/2006 and no changes required. Never Smoked Alcohol use-no Drug use-no  Review of Systems      See HPI  Physical Exam  General:  Well-developed,well-nourished,in no acute distress; alert,appropriate and cooperative throughout  examination Mouth:  pharyngeal erythema and pharyngeal exudate.   Neck:  No deformities, masses, or tenderness noted. Lungs:  Normal respiratory effort, chest expands symmetrically. Lungs are clear to auscultation, no crackles or wheezes. Psych:  Oriented X3 and normally interactive.     Impression & Recommendations:  Problem # 1:  ACUTE PHARYNGITIS (ICD-462)  Her updated medication list for this problem includes:    Zithromax Z-pak 250 Mg Tabs (Azithromycin) .Marland Kitchen... As directed  Instructed to complete antibiotics and call if not improved in 48 hours.   Orders: Rapid Strep (13086)  Complete Medication List: 1)  Synthroid 112 Mcg Tabs (Levothyroxine sodium) .... Take one tablet daily 2)  Norvasc 10 Mg Tabs (Amlodipine besylate) .Marland Kitchen.. 1 by mouth once daily 3)  Fluticasone Propionate 50 Mcg/act Susp (Fluticasone propionate) .... 2 sprays each nostril once daily 4)  Seasonique 0.15-0.03 &0.01 Mg Tabs (Levonorgest-eth estrad 91-day) 5)  Zithromax Z-pak 250 Mg Tabs (Azithromycin) .... As directed 6)  Fluconazole 150 Mg Tabs (Fluconazole) .Marland Kitchen.. 1 by mouth once daily x1,  may repeat in 3 days as needed  Patient Instructions: 1)  Take 400-600 mg of Ibuprofen (Advil, Motrin) with food every 4-6 hours as needed  for relief of pain or comfort of fever.  2)  gargle with salt water 3)  Please schedule a follow-up appointment as needed .  Prescriptions: FLUCONAZOLE 150 MG TABS (FLUCONAZOLE) 1 by mouth once daily x1,  may repeat in 3 days as needed  #2 x 2   Entered and Authorized by:   Loreen Freud DO   Signed by:   Loreen Freud DO on 05/31/2009   Method used:  [facility]ron[facility] to        [facility] [phone]* (retail)       [address] [place], [place]  [address]       Ph: [phone] or [phone]       Fax: [phone]   RxID:   [id number] ZITHROMAX Z-PAK 250 MG TABS (AZITHROMYCIN) as directed  #1 x 0   Entered and Authorized by:   Loreen Freud DO   Signed by:   Loreen Freud DO on 05/31/2009   Method used:   Electronically to        [facility] [phone]* (retail)       [address] [place], [place]  [address]       Ph: [phone] or [phone]       Fax: [phone]   RxID:   [id number]   Laboratory Results   Date/Time Reported: May 31, 2009 10:55 AM   Other Tests  Rapid Strep: negative    Appended Document: Orders Update    Clinical Lists Changes  Orders: Added new Test order of T-Culture, Throat [phone]) - Signed

## 2010-04-05 NOTE — Assessment & Plan Note (Signed)
Summary: blister on lips//possible allergic reaction//lch   Vital Signs:  Patient profile:   40 year old female Weight:      206 pounds Temp:     98.9 degrees F oral Pulse rate:   87 / minute Pulse rhythm:   regular BP sitting:   124 / 80  (left arm) Cuff size:   large  Vitals Entered By: Army Fossa CMA (May 02, 2009 9:48 AM) CC: Pt c/o itching and swelling starting sunday night, blisters on lips and on inside of mouth started last night   History of Present Illness: Pt here c/o itching and swelling of lips with blisters sinc Sunday.  No sob, no difficulty breathing.  No new meds , foods etc.   No chest pain, or sob.    Current Medications (verified): 1)  Synthroid 112 Mcg  Tabs (Levothyroxine Sodium) .... Take One Tablet Daily 2)  Norvasc 10 Mg Tabs (Amlodipine Besylate) .Marland Kitchen.. 1 By Mouth Once Daily 3)  Fluticasone Propionate 50 Mcg/act  Susp (Fluticasone Propionate) .... 2 Sprays Each Nostril Once Daily 4)  Diflucan 150 Mg Tabs (Fluconazole) .Marland Kitchen.. 1 By Mouth X1,  May Repeat in 3 Days As Needed 5)  Seasonique 0.15-0.03 &0.01 Mg Tabs (Levonorgest-Eth Estrad 91-Day) 6)  Prednisone 10 Mg Tabs (Prednisone) .... 3 By Mouth Once Daily For 3 Days Then 2 By Mouth Once Daily For 3 Days Then 1 By Mouth Once Daily For 3 Days 7)  Prednisone 10 Mg Tabs (Prednisone) .... 3 By Mouth Once Daily For 3 Days Then 2 By Mouth Once Daily For 3 Days Then 1 By Mouth Once Daily For 3 Days  Allergies: 1)  Penicillin G Potassium (Penicillin G Potassium)  Past History:  Past medical, surgical, family and social histories (including risk factors) reviewed for relevance to current acute and chronic problems.  Past Medical History: Reviewed history from 08/24/2007 and no changes required. Hypertension Allergy Thyroid nodule Hyperlipidemia Current Problems:  HYPERLIPIDEMIA (ICD-272.4) SHOULDER STRAIN (ICD-840.9) OBESITY (ICD-278.00) HYPOTHYROIDISM (ICD-244.9) ALLERGIC RHINITIS, HX OF  (ICD-477.9) HYPERTENSION (ICD-401.9)  Family History: Reviewed history and no changes required.  Social History: Reviewed history from 08/13/2006 and no changes required. Never Smoked Alcohol use-no Drug use-no  Review of Systems      See HPI  Physical Exam  General:  Well-developed,well-nourished,in no acute distress; alert,appropriate and cooperative throughout examination Mouth:  lips swollen and blister on R Low lip + blister roof of mouth Lungs:  Normal respiratory effort, chest expands symmetrically. Lungs are clear to auscultation, no crackles or wheezes. Heart:  Normal rate and regular rhythm. S1 and S2 normal without gallop, murmur, click, rub or other extra sounds. Psych:  Oriented X3 and normally interactive.     Impression & Recommendations:  Problem # 1:  ANGIOEDEMA (ICD-995.1)  ? ARB  Orders: Admin of Therapeutic Inj  intramuscular or subcutaneous (16109) Depo- Medrol 80mg  (J1040)  Problem # 2:  HYPERTENSION (ICD-401.9)  The following medications were removed from the medication list:    Diovan 160 Mg Tabs (Valsartan) .Marland Kitchen... 1 by mouth once daily Her updated medication list for this problem includes:    Norvasc 10 Mg Tabs (Amlodipine besylate) .Marland Kitchen... 1 by mouth once daily  Complete Medication List: 1)  Synthroid 112 Mcg Tabs (Levothyroxine sodium) .... Take one tablet daily 2)  Norvasc 10 Mg Tabs (Amlodipine besylate) .Marland Kitchen.. 1 by mouth once daily 3)  Fluticasone Propionate 50 Mcg/act Susp (Fluticasone propionate) .... 2 sprays each nostril once daily 4)  Diflucan  150 Mg Tabs (Fluconazole) .Marland Kitchen.. 1 by mouth x1,  may repeat in 3 days as needed 5)  Seasonique 0.15-0.03 &0.01 Mg Tabs (Levonorgest-eth estrad 91-day) 6)  Prednisone 10 Mg Tabs (Prednisone) .... 3 by mouth once daily for 3 days then 2 by mouth once daily for 3 days then 1 by mouth once daily for 3 days 7)  Prednisone 10 Mg Tabs (Prednisone) .... 3 by mouth once daily for 3 days then 2 by mouth once  daily for 3 days then 1 by mouth once daily for 3 days  Other Orders: T-Culture, Wound (87070/[id number]) T-Culture, Wound (87070/[id number])  Patient Instructions: 1)  f/u 2 weeks or sooner as needed 2)  if any problems swallowing or breathing go to ER Prescriptions: PREDNISONE 10 MG TABS (PREDNISONE) 3 by mouth once daily for 3 days then 2 by mouth once daily for 3 days then 1 by mouth once daily for 3 days  #18 x 0   Entered and Authorized by:   Loreen Freud DO   Signed by:   Loreen Freud DO on 05/02/2009   Method used:   Electronically to        [facility] [phone][facility] (retail)       [address]       [place], [place]  [address]       Ph: [phone]       Fax: [phone]   RxID:   [id number] NORVASC 10 MG TABS (AMLODIPINE BESYLATE) 1 by mouth once daily  #90 x 3   Entered and Authorized by:   Loreen Freud DO   Signed by:   Loreen Freud DO on 05/02/2009   Method used:   Faxed to ...       [facility] (mail-order)             , [place]         Ph: [phone]       Fax: [phone]   RxID:   [id number] PREDNISONE 10 MG TABS (PREDNISONE) 3 by mouth once daily for 3 days then 2 by mouth once daily for 3 days then 1 by mouth once daily for 3 days  #18 x 0   Entered and Authorized by:   Loreen Freud DO   Signed by:   Loreen Freud DO on 05/02/2009   Method used:   Print then Give to Patient   RxID:   [id number]    Medication Administration  Injection # 1:    Medication: Depo- Medrol 80mg     Diagnosis: ANGIOEDEMA (ICD-995.1)    Route: IM    Site: RUOQ gluteus    Exp Date: 01/2010    Lot #: [id number]    Mfr: novaplus  Orders Added: 1)  T-Culture, Wound [87070/[id number]] 2)  T-Culture, Wound [[id number]] 3)  Admin of Therapeutic Inj  intramuscular or subcutaneous [96372] 4)  Depo- Medrol 80mg  [J1040] 5)  Est. Patient Level III [73220]

## 2010-04-05 NOTE — Progress Notes (Signed)
Summary: Lab Results   Phone Note Outgoing Call   Summary of Call: Regarding Lab results, LMTCB:  + UTI--on cipro Initial call taken by: Army Fossa CMA,  March 22, 2009 8:53 AM  Follow-up for Phone Call        Pt is aware. Army Fossa CMA  March 23, 2009 2:53 PM

## 2010-04-05 NOTE — Progress Notes (Signed)
Summary: NEEDS DIFF MEDICATION  Phone Note Call from Patient Call back at Home Phone [phone]   Caller: Patient Summary of Call: STARTED MICROBID ON MONDAY FOR UTI  (WAS CALLED TO STOP LEVOQUIN AND START MICROBID FOR 7 DAYS)  SAYS IT IS MAKING HER SICK ENOUGH THAT SHE IS ACTUALLY THROWING UP---SO SHE HASNT TAKEN ANY MEDS TODAY BECAUSE SHE DOESNT WANT TO GET SICK---  MEDICATION IS HELPING THE UTI  CAN DR LOWNE EITHER GIVE HER A DIFFERENT MEDICATION OR SOMETHING FOR NAUSEA?     [facility] [address] [facility]    Initial call taken by: Jerolyn Shin,  October 27, 2009 1:08 PM  Follow-up for Phone Call        spk with the pt and she sd the Macrobid is working but making her nauseated and wanted to know if we could either changed the rx or call in something for the nausea. [facility]  Almeta Monas CMA Duncan Dull)  October 27, 2009 2:05 PM   Additional Follow-up for Phone Call Additional follow up Details #1::        unfortunately she is either allergic to or her infection is resistant to other med ----or they are only IV.  Phenergan 25 mg 1 by mouth qid as needed #20 Additional Follow-up by: Loreen Freud DO,  October 27, 2009 2:35 PM    New/Updated Medications: PROMETHAZINE HCL 25 MG TABS (PROMETHAZINE HCL) 1 by mouth qid prn Prescriptions: PROMETHAZINE HCL 25 MG TABS (PROMETHAZINE HCL) 1 by mouth qid prn  #20 x 0   Entered by:   Almeta Monas CMA (AAMA)   Authorized by:   Loreen Freud DO   Signed by:   Almeta Monas CMA (AAMA) on 10/27/2009   Method used:   [facility]ed to ...       [facility] [phone]* (retail)       [address] [place], [place]  [address]       Ph: [phone] or [phone]       Fax: [phone]   RxID:   [id number]  pt aware that rx ws faxed to pharmacy              Almeta Monas CMA Duncan Dull)  October 27, 2009 3:56 PM

## 2010-04-05 NOTE — Assessment & Plan Note (Signed)
Summary: UTI/VERY PAINFUL//KN   Vital Signs:  Patient profile:   40 year old female Height:      66 inches Weight:      180 pounds Temp:     98.5 degrees F oral Pulse rate:   76 / minute BP sitting:   100 / 68  (left arm)  Vitals Entered By: Jeremy Johann CMA (October 20, 2009 11:51 AM) CC: burning with urination, Dysuria   History of Present Illness:  Dysuria      This is a 40 year old woman who presents with Dysuria.  The symptoms began 3 days ago.  The patient complains of burning with urination and urinary frequency, but denies urgency, hematuria, vaginal discharge, vaginal itching, vaginal sores, and penile discharge.  The patient denies the following associated symptoms: nausea, vomiting, fever, shaking chills, flank pain, abdominal pain, back pain, pelvic pain, and arthralgias.  Risk factors for urinary tract infection include prior antibiotics.  The patient denies the following risk factors: diabetes, immunosuppression, history of GU anomaly, history of pyelonephritis, pregnancy, history of STD, and analgesic abuse.  History is significant for recent UTI.    Current Medications (verified): 1)  Synthroid 112 Mcg  Tabs (Levothyroxine Sodium) .... Take One Tablet Daily 2)  Norvasc 10 Mg Tabs (Amlodipine Besylate) .Marland Kitchen.. 1 By Mouth Once Daily 3)  Fluticasone Propionate 50 Mcg/act  Susp (Fluticasone Propionate) .... 2 Sprays Each Nostril Once Daily 4)  Seasonique 0.15-0.03 &0.01 Mg Tabs (Levonorgest-Eth Estrad 91-Day) 5)  Levaquin 500 Mg Tabs (Levofloxacin) .Marland Kitchen.. 1 By Mouth Once Daily  Allergies (verified): 1)  ! * Fluconzaole 2)  Penicillin G Potassium (Penicillin G Potassium)  Past History:  Past medical, surgical, family and social histories (including risk factors) reviewed for relevance to current acute and chronic problems.  Past Medical History: Reviewed history from 08/24/2007 and no changes required. Hypertension Allergy Thyroid nodule Hyperlipidemia Current  Problems:  HYPERLIPIDEMIA (ICD-272.4) SHOULDER STRAIN (ICD-840.9) OBESITY (ICD-278.00) HYPOTHYROIDISM (ICD-244.9) ALLERGIC RHINITIS, HX OF (ICD-477.9) HYPERTENSION (ICD-401.9)  Family History: Reviewed history and no changes required.  Social History: Reviewed history from 08/13/2006 and no changes required. Never Smoked Alcohol use-no Drug use-no  Review of Systems      See HPI  Physical Exam  General:  Well-developed,well-nourished,in no acute distress; alert,appropriate and cooperative throughout examination Abdomen:  Bowel sounds positive,abdomen soft and non-tender without masses, organomegaly or hernias noted. Psych:  Oriented X3 and normally interactive.     Impression & Recommendations:  Problem # 1:  UTI (ICD-599.0) recheck urine in 2 weeks  Her updated medication list for this problem includes:    Levaquin 500 Mg Tabs (Levofloxacin) .Marland Kitchen... 1 by mouth once daily  Orders: Specimen Handling (66440) T-Culture, Urine ([id number]) UA Dipstick w/o Micro (manual) (81002)  Encouraged to push clear liquids, get enough rest, and take acetaminophen as needed. To be seen in 10 days if no improvement, sooner if worse.  Complete Medication List: 1)  Synthroid 112 Mcg Tabs (Levothyroxine sodium) .... Take one tablet daily 2)  Norvasc 10 Mg Tabs (Amlodipine besylate) .Marland Kitchen.. 1 by mouth once daily 3)  Fluticasone Propionate 50 Mcg/act Susp (Fluticasone propionate) .... 2 sprays each nostril once daily 4)  Seasonique 0.15-0.03 &0.01 Mg Tabs (Levonorgest-eth estrad 91-day) 5)  Levaquin 500 Mg Tabs (Levofloxacin) .Marland Kitchen.. 1 by mouth once daily  Patient Instructions: 1)  recheck urine ---UA,  C&S  in 2 weeks 2)  Drink more water!! Prescriptions: LEVAQUIN 500 MG TABS (LEVOFLOXACIN) 1 by mouth once daily  #  5 x 0   Entered and Authorized by:   Loreen Freud DO   Signed by:   Loreen Freud DO on 10/20/2009   Method used:   [facility] to        [facility] [phone]* (retail)        [address]       [place], [place]  [address]       Ph: [phone] or [phone]       Fax: [phone]   RxID:   [id number]   Laboratory Results   Urine Tests   Date/Time Reported: October 20, 2009 11:52 AM  Routine Urinalysis   Color: yellow Appearance: Clear Glucose: negative   (Normal Range: Negative) Bilirubin: negative   (Normal Range: Negative) Ketone: negative   (Normal Range: Negative) Spec. Gravity: >=1.030   (Normal Range: 1.003-1.035) Blood: large   (Normal Range: Negative) pH: 7.5   (Normal Range: 5.0-8.0) Protein: negative   (Normal Range: Negative) Urobilinogen: 0.2   (Normal Range: 0-1) Nitrite: negative   (Normal Range: Negative) Leukocyte Esterace: large   (Normal Range: Negative)

## 2010-04-05 NOTE — Assessment & Plan Note (Signed)
Summary: for a uti infection//ph   Vital Signs:  Patient profile:   40 year old female Height:      66 inches Weight:      206 pounds BMI:     33.37 Temp:     98.6 degrees F oral Pulse rate:   82 / minute Pulse rhythm:   regular BP sitting:   138 / 80  (left arm) Cuff size:   large  Vitals Entered By: Army Fossa CMA (March 16, 2009 3:57 PM) CC: possible uti, urinating more frequent, discomfort. , Dysuria   History of Present Illness:  Dysuria      This is a 40 year old woman who presents with Dysuria.  The symptoms began 2 days ago.  The patient complains of burning with urination, urinary frequency, and urgency, but denies hematuria, vaginal discharge, vaginal itching, vaginal sores, and penile discharge.  The patient denies the following associated symptoms: nausea, vomiting, fever, shaking chills, flank pain, abdominal pain, back pain, pelvic pain, and arthralgias.  The patient denies the following risk factors: diabetes, prior antibiotics, immunosuppression, history of GU anomaly, history of pyelonephritis, pregnancy, history of STD, and analgesic abuse.  History is significant for no urinary tract problems.    Current Medications (verified): 1)  Diovan 160 Mg Tabs (Valsartan) .Marland Kitchen.. 1 By Mouth Once Daily 2)  Synthroid 112 Mcg  Tabs (Levothyroxine Sodium) .... Take One Tablet Daily 3)  Norvasc 5 Mg  Tabs (Amlodipine Besylate) .Marland Kitchen.. 1 By Mouth Once Daily 4)  Fluticasone Propionate 50 Mcg/act  Susp (Fluticasone Propionate) .... 2 Sprays Each Nostril Once Daily 5)  Cipro 500 Mg Tabs (Ciprofloxacin Hcl) .Marland Kitchen.. 1 By Mouth Two Times A Day 6)  Diflucan 150 Mg Tabs (Fluconazole) .Marland Kitchen.. 1 By Mouth X1,  May Repeat in 3 Days As Needed  Allergies: 1)  Penicillin G Potassium (Penicillin G Potassium)  Past History:  Past medical, surgical, family and social histories (including risk factors) reviewed for relevance to current acute and chronic problems.  Past Medical History: Reviewed  history from 08/24/2007 and no changes required. Hypertension Allergy Thyroid nodule Hyperlipidemia Current Problems:  HYPERLIPIDEMIA (ICD-272.4) SHOULDER STRAIN (ICD-840.9) OBESITY (ICD-278.00) HYPOTHYROIDISM (ICD-244.9) ALLERGIC RHINITIS, HX OF (ICD-477.9) HYPERTENSION (ICD-401.9)  Family History: Reviewed history and no changes required.  Social History: Reviewed history from 08/13/2006 and no changes required. Never Smoked Alcohol use-no Drug use-no  Review of Systems      See HPI  Physical Exam  General:  Well-developed,well-nourished,in no acute distress; alert,appropriate and cooperative throughout examination Abdomen:  Bowel sounds positive,abdomen soft and non-tender without masses, organomegaly or hernias noted. Psych:  Oriented X3 and normally interactive.     Impression & Recommendations:  Problem # 1:  UTI (ICD-599.0)  culture sent Her updated medication list for this problem includes:    Cipro 500 Mg Tabs (Ciprofloxacin hcl) .Marland Kitchen... 1 by mouth two times a day  Encouraged to push clear liquids, get enough rest, and take acetaminophen as needed. To be seen in 10 days if no improvement, sooner if worse.  Orders: T-Culture, Urine ([id number])  Complete Medication List: 1)  Diovan 160 Mg Tabs (Valsartan) .Marland Kitchen.. 1 by mouth once daily 2)  Synthroid 112 Mcg Tabs (Levothyroxine sodium) .... Take one tablet daily 3)  Norvasc 5 Mg Tabs (Amlodipine besylate) .Marland Kitchen.. 1 by mouth once daily 4)  Fluticasone Propionate 50 Mcg/act Susp (Fluticasone propionate) .... 2 sprays each nostril once daily 5)  Cipro 500 Mg Tabs (Ciprofloxacin hcl) .Marland Kitchen.. 1 by mouth two  times a day 6)  Diflucan 150 Mg Tabs (Fluconazole) .Marland Kitchen.. 1 by mouth x1,  may repeat in 3 days as needed Prescriptions: DIOVAN 160 MG TABS (VALSARTAN) 1 by mouth once daily  #90 x 3   Entered and Authorized by:   Loreen Freud DO   Signed by:   Loreen Freud DO on 03/16/2009   Method used:   Faxed to ...       [facility]  (mail-order)             , [place]         Ph: [phone]       Fax: [phone]   RxID:   [id number] DIFLUCAN 150 MG TABS (FLUCONAZOLE) 1 by mouth x1,  may repeat in 3 days as needed  #2 x 3   Entered and Authorized by:   Loreen Freud DO   Signed by:   Loreen Freud DO on 03/16/2009   Method used:   [facility]ronically to        [facility] [phone][facility] (retail)       [address]       [place], [place]  [address]       Ph: [phone] or [phone]       Fax: [phone]   RxID:   [id number] CIPRO 500 MG TABS (CIPROFLOXACIN HCL) 1 by mouth two times a day  #10 x 0   Entered and Authorized by:   Loreen Freud DO   Signed by:   Loreen Freud DO on 03/16/2009   Method used:   Electronically to        [facility] [phone]* (retail)       [address] [place], [place]  [address]       Ph: [phone] or [phone]       Fax: [phone]   RxID:   [id number]   Laboratory Results   Urine Tests    Routine Urinalysis   Color: yellow Appearance: Clear Glucose: negative   (Normal Range: Negative) Bilirubin: negative   (Normal Range: Negative) Ketone: negative   (Normal Range: Negative) Spec. Gravity: 1.015   (Normal Range: 1.003-1.035) Blood: moderate   (Normal Range: Negative) pH: 6.0   (Normal Range: 5.0-8.0) Protein: negative   (Normal Range: Negative) Urobilinogen: 0.2   (Normal Range: 0-1) Nitrite: positive   (Normal Range: Negative) Leukocyte Esterace: negative   (Normal Range: Negative)    Comments: Army Fossa CMA  March 16, 2009 4:05 PM

## 2010-04-05 NOTE — Assessment & Plan Note (Signed)
Summary: possible uti//kn   Vital Signs:  Patient profile:   40 year old female Height:      66 inches Weight:      185 pounds Temp:     98.8 degrees F oral BP sitting:   118 / 82  (left arm)  Vitals Entered By: Jeremy Johann CMA (September 14, 2009 1:36 PM) CC: BURN AND FREQUENT WITH URINATION, Dysuria Comments REVIEWED MED LIST, PATIENT AGREED DOSE AND INSTRUCTION CORRECT    History of Present Illness:  Dysuria      This is a 40 year old woman who presents with Dysuria.  The symptoms began 3 days ago.  The patient complains of burning with urination and urgency, but denies urinary frequency, hematuria, vaginal discharge, vaginal itching, vaginal sores, and penile discharge.  The patient denies the following associated symptoms: nausea, vomiting, fever, shaking chills, flank pain, abdominal pain, back pain, pelvic pain, and arthralgias.  The patient denies the following risk factors: diabetes, prior antibiotics, immunosuppression, history of GU anomaly, history of pyelonephritis, pregnancy, history of STD, and analgesic abuse.  History is significant for recent UTI.    Current Medications (verified): 1)  Synthroid 112 Mcg  Tabs (Levothyroxine Sodium) .... Take One Tablet Daily 2)  Norvasc 10 Mg Tabs (Amlodipine Besylate) .Marland Kitchen.. 1 By Mouth Once Daily 3)  Fluticasone Propionate 50 Mcg/act  Susp (Fluticasone Propionate) .... 2 Sprays Each Nostril Once Daily 4)  Seasonique 0.15-0.03 &0.01 Mg Tabs (Levonorgest-Eth Estrad 91-Day) 5)  Cipro 500 Mg Tabs (Ciprofloxacin Hcl) .Marland Kitchen.. 1 By Mouth Two Times A Day 6)  Pyridium 200 Mg Tabs (Phenazopyridine Hcl) .Marland Kitchen.. 1 By Mouth Three Times A Day  Allergies: 1)  ! * Fluconzaole 2)  Penicillin G Potassium (Penicillin G Potassium)  Past History:  Past medical, surgical, family and social histories (including risk factors) reviewed for relevance to current acute and chronic problems.  Past Medical History: Reviewed history from 08/24/2007 and no changes  required. Hypertension Allergy Thyroid nodule Hyperlipidemia Current Problems:  HYPERLIPIDEMIA (ICD-272.4) SHOULDER STRAIN (ICD-840.9) OBESITY (ICD-278.00) HYPOTHYROIDISM (ICD-244.9) ALLERGIC RHINITIS, HX OF (ICD-477.9) HYPERTENSION (ICD-401.9)  Family History: Reviewed history and no changes required.  Social History: Reviewed history from 08/13/2006 and no changes required. Never Smoked Alcohol use-no Drug use-no  Review of Systems      See HPI  Physical Exam  General:  Well-developed,well-nourished,in no acute distress; alert,appropriate and cooperative throughout examination Psych:  Cognition and judgment appear intact. Alert and cooperative with normal attention span and concentration. No apparent delusions, illusions, hallucinations   Impression & Recommendations:  Problem # 1:  UTI (ICD-599.0)  Her updated medication list for this problem includes:    Cipro 500 Mg Tabs (Ciprofloxacin hcl) .Marland Kitchen... 1 by mouth two times a day    Pyridium 200 Mg Tabs (Phenazopyridine hcl) .Marland Kitchen... 1 by mouth three times a day  Orders: T-Culture, Urine ([id number])  Encouraged to push clear liquids, get enough rest, and take acetaminophen as needed. To be seen in 10 days if no improvement, sooner if worse.  Complete Medication List: 1)  Synthroid 112 Mcg Tabs (Levothyroxine sodium) .... Take one tablet daily 2)  Norvasc 10 Mg Tabs (Amlodipine besylate) .Marland Kitchen.. 1 by mouth once daily 3)  Fluticasone Propionate 50 Mcg/act Susp (Fluticasone propionate) .... 2 sprays each nostril once daily 4)  Seasonique 0.15-0.03 &0.01 Mg Tabs (Levonorgest-eth estrad 91-day) 5)  Cipro 500 Mg Tabs (Ciprofloxacin hcl) .Marland Kitchen.. 1 by mouth two times a day 6)  Pyridium 200 Mg Tabs (Phenazopyridine hcl) .Marland KitchenMarland KitchenMarland Kitchen  1 by mouth three times a day  Prescriptions: PYRIDIUM 200 MG TABS (PHENAZOPYRIDINE HCL) 1 by mouth three times a day  #6 x 0   Entered and Authorized by:   Loreen Freud DO   Signed by:   Loreen Freud DO on  09/14/2009   Method used:   [facility]ron[facility] to        [facility] [phone]* (retail)       [address]       [place], [place]  [address]       Ph: [phone]       Fax: [phone]   RxID:   [id number] CIPRO 500 MG TABS (CIPROFLOXACIN HCL) 1 by mouth two times a day  #10 x 0   Entered and Authorized by:   Loreen Freud DO   Signed by:   Loreen Freud DO on 09/14/2009   Method used:   [facility] to        [facility] [phone]* (retail)       [address]       [place], [place]  [address]       Ph: [phone]       Fax: [phone]   RxID:   [id number]   Laboratory Results

## 2010-04-05 NOTE — Letter (Signed)
[facility]   Imported By: Lanelle Bal 12/26/2009 13:53:46  _____________________________________________________________________  External Attachment:    Type:   Image     Comment:   External Document

## 2010-04-05 NOTE — Progress Notes (Signed)
Summary: New Rx  Phone Note Outgoing Call Call back at [facility] Phone [phone]   Call placed by: Almeta Monas CMA Duncan Dull),  October 23, 2009 1:49 PM Details for Reason: pt needs a new Rx called to pharmacy Summary of Call: resistant to levaquin---- change to macrobid 1 by mouth two times a day for 7 days    Tried calling pt, lmtc on vm..... Almeta Monas CMA Duncan Dull)  October 23, 2009 1:49 PM   Follow-up for Phone Call        [facility] [address] [facility] [place]. She would like the generic if there is one avaible. Follow-up by: Harold Barban,  October 23, 2009 1:57 PM    New/Updated Medications: MACROBID 100 MG CAPS (NITROFURANTOIN MONOHYD MACRO) 1 by mouth twice a day for 7 days Prescriptions: MACROBID 100 MG CAPS (NITROFURANTOIN MONOHYD MACRO) 1 by mouth twice a day for 7 days  #14 x 0   Entered by:   Almeta Monas CMA (AAMA)   Authorized by:   Loreen Freud DO   Signed by:   Almeta Monas CMA (AAMA) on 10/23/2009   Method used:   [facility] [phone]* (retail)       [address]       [place], [place]  [address]       Ph: [phone] or [phone]       Fax: [phone]   RxID:   [id number]

## 2010-04-20 ENCOUNTER — Telehealth: Payer: Self-pay | Admitting: Family Medicine

## 2010-04-24 ENCOUNTER — Encounter (INDEPENDENT_AMBULATORY_CARE_PROVIDER_SITE_OTHER): Payer: Self-pay | Admitting: *Deleted

## 2010-04-24 ENCOUNTER — Other Ambulatory Visit (INDEPENDENT_AMBULATORY_CARE_PROVIDER_SITE_OTHER): Payer: Managed Care, Other (non HMO)

## 2010-04-24 ENCOUNTER — Other Ambulatory Visit: Payer: Self-pay | Admitting: Family Medicine

## 2010-04-24 DIAGNOSIS — E785 Hyperlipidemia, unspecified: Secondary | ICD-10-CM

## 2010-04-24 DIAGNOSIS — I10 Essential (primary) hypertension: Secondary | ICD-10-CM

## 2010-04-24 DIAGNOSIS — E039 Hypothyroidism, unspecified: Secondary | ICD-10-CM

## 2010-04-24 LAB — HEPATIC FUNCTION PANEL
AST: 15 U/L (ref 0–37)
Albumin: 3.9 g/dL (ref 3.5–5.2)
Alkaline Phosphatase: 59 U/L (ref 39–117)
Total Protein: 6.7 g/dL (ref 6.0–8.3)

## 2010-04-24 LAB — BASIC METABOLIC PANEL
CO2: 30 mEq/L (ref 19–32)
Calcium: 9.6 mg/dL (ref 8.4–10.5)
Creatinine, Ser: 0.7 mg/dL (ref 0.4–1.2)
Sodium: 141 mEq/L (ref 135–145)

## 2010-04-24 LAB — TSH: TSH: 2.99 u[IU]/mL (ref 0.35–5.50)

## 2010-04-24 LAB — LIPID PANEL
Cholesterol: 233 mg/dL — ABNORMAL HIGH (ref 0–200)
HDL: 75.1 mg/dL (ref >39.00)
Total CHOL/HDL Ratio: 3
Triglycerides: 77 mg/dL (ref 0.0–149.0)

## 2010-04-25 LAB — LDL CHOLESTEROL, DIRECT: Direct LDL: 148.8 mg/dL

## 2010-04-25 NOTE — Progress Notes (Signed)
Summary: 2-17-Refill  Phone Note Refill Request   Refills Requested: Medication #1:  SYNTHROID 112 MCG  TABS Take one tablet daily  Medication #2:  NORVASC 10 MG TABS 1 by mouth once daily [facility].Marland KitchenMarland KitchenMarland KitchenFelecia Deloach CMA  April 20, 2010 10:47 AM    Follow-up for Phone Call        left message to call office. Pt due for labs:LIPID,BASIC METABOLIC,HEPATIC,TSH. Pt needs to have labs for med to be refilled ....Marland KitchenMarland KitchenFelecia Deloach CMA  April 20, 2010 10:52 AM   Additional Follow-up for Phone Call Additional follow up Details #1::        Patient returned call, fasting lab appt is scheduled for 04-24-2010.  Please just verify dx codes entered for the lab work, thanks Magdalen Spatz [facility]  April 20, 2010 11:37 AM     Prescriptions: NORVASC 10 MG TABS (AMLODIPINE BESYLATE) 1 by mouth once daily  #90 x 0   Entered by:   Almeta Monas CMA (AAMA)   Authorized by:   Loreen Freud DO   Signed by:   Almeta Monas CMA (AAMA) on 04/20/2010   Method used:   Faxed to ...       [facility] (mail-order)             , [place]         Ph: [phone]       Fax: [phone]   RxID:   [id number] SYNTHROID 112 MCG  TABS (LEVOTHYROXINE SODIUM) Take one tablet daily  #90 x 0   Entered by:   Almeta Monas CMA (AAMA)   Authorized by:   Loreen Freud DO   Signed by:   Almeta Monas CMA (AAMA) on 04/20/2010   Method used:   Faxed to ...       [facility] (mail-order)             , [place]         Ph: [phone]       Fax: [phone]   RxID:   [id number]

## 2010-05-15 LAB — CBC
Hemoglobin: 10 g/dL — ABNORMAL LOW (ref 12.0–15.0)
Hemoglobin: 12.4 g/dL (ref 12.0–15.0)
MCH: 29.9 pg (ref 26.0–34.0)
MCH: 30.7 pg (ref 26.0–34.0)
MCHC: 33.6 g/dL (ref 30.0–36.0)
MCV: 91 fL (ref 78.0–100.0)
Platelets: 234 10*3/uL (ref 150–400)
RBC: 4.16 MIL/uL (ref 3.87–5.11)
RDW: 14.1 % (ref 11.5–15.5)

## 2010-07-17 NOTE — Procedures (Signed)
[name], [name]              ACCOUNT NO.:  [id number]   MEDICAL RECORD NO.:  [id number]          PATIENT TYPE:  OUT   LOCATION:  SLEEP CENTER                 FACILITY:  [facility]   PHYSICIAN:  Clinton D. Maple Hudson, MD, FCCP, FACPDATE OF BIRTH:  1972/02/01   DATE OF STUDY:  03/01/2007                            NOCTURNAL POLYSOMNOGRAM   REFERRING PHYSICIAN:  Thornton Park. Daphine Deutscher, MD   INDICATION FOR STUDY:  Hypersomnia with sleep apnea.   EPWORTH SLEEPINESS SCORE:  4/24.  BMI 42.6, weight 264 pounds, height 66  inches.  Neck 16 inches.   MEDICATIONS:  Home medications charted and reviewed.   SLEEP ARCHITECTURE:  Total sleep time 259 minutes with sleep efficiency  59%.  Stage 1 was 10%, stage 2 84%, stage 3 0.8 percent.  REM 4.2% of  total sleep time.  Sleep latency 58 minutes.  REM latency 240 minutes.  Awake after sleep onset 111 minutes.  Arousal index 7.6.  No bedtime  medication was taken.  Sleep was marked by repeated frequent nonspecific  awakenings.   RESPIRATORY DATA:  Apnea-hypopnea index (AHI) 20.2 obstructive events  per hour,  indicating moderate obstructive sleep apnea/hypopnea  syndrome.  There were 87 obstructive events, including 2 obstructive  apneas, 1 mixed apnea, and 84 hypopneas.  Most sleep and events were  recorded while off flat of back.  REM AHI 38.2.  Diagnostic MPSG  protocol was ordered and followed, so CPAP titration was not performed.   OXYGEN DATA:  Moderate snoring with oxygen desaturation to a nadir of  90%.  Mean oxygen saturation through the study was 96.9% on room air.   CARDIAC DATA:  Normal sinus rhythm.   MOVEMENT-PARASOMNIA:  No significant movement disturbance.  No bathroom  trips.   IMPRESSIONS-RECOMMENDATIONS:  1. Moderate obstructive sleep apnea/hypopnea syndrome, AHI 20.2 per      hour with most events and sleep recorded while off flat of back.      Moderately loud snoring with oxygen desaturation to a nadir of 90%.  2. Consider  return for CPAP titration or evaluate for alternative      therapies as appropriate.  In case surgery is considered, suggest      using CPAP autotitration or empiric CPAP pressure of 10 CWP during      the sedated      postoperative period, if clinically appropriate, until CPAP      titration can be achieved.  Ultimately, weight loss is likely to be      helpful.      Clinton D. Maple Hudson, MD, [facility], FACP  Diplomate, Biomedical engineer of Sleep Medicine  Electronically Signed     CDY/MEDQ  D:  03/07/2007 10:23:38  T:  03/07/2007 11:22:16  Job:  [id number]

## 2010-07-17 NOTE — Op Note (Signed)
NAMEPARLEE, [name]              ACCOUNT NO.:  [id number]   MEDICAL RECORD NO.:  [id number]          PATIENT TYPE:  AMB   LOCATION:  DAY                          FACILITY:  [facility]   PHYSICIAN:  Thornton Park. Daphine Deutscher, MD  DATE OF BIRTH:  December 21, 1970   DATE OF PROCEDURE:  DATE OF DISCHARGE:                               OPERATIVE REPORT   PREOPERATIVE DIAGNOSIS:  Morbid obesity, body mass index of 47.   POSTOPERATIVE DIAGNOSIS:  Morbid obesity, body mass index of 47.   PROCEDURE:  Laparoscopic adjustable gastric banding (Allergan APS  system).   SURGEON:  Thornton Park. Daphine Deutscher, MD.   ASSISTANT:  Alfonse Ras, MD.   ANESTHESIA:  General endotracheal.   FINDINGS:  No hiatal hernia.   DESCRIPTION OF PROCEDURE:  [name] is a 40 year old African  American lady taken to room #1 on October 12, 2007, and given general  anesthesia.  The abdomen was prepped with technique and draped  sterilely.  I entered the abdomen through the left upper quadrant using  a 0 degree OptiVu without difficulty.  The abdomen was insufflated and  standard trocar placement was used including a 15 in the right upper  quadrant obliquely and 11-12 below that.  I first dissected along the  left side and created a space for the band to come out and then inserted  the balloon-tip catheter and blew it up, pulled it back and there was no  hiatal hernia noted.  I then I went along and did the pars flaccida  approach at the fat stripe along the right crus and then inserted the  band passer which came out easily.  An APS band was chosen based on her  size and the amount of fat and this seemed to fit nicely.  It was  introduced at brought around the stomach without difficulty.  It was  clamped down over the band tube sizer.  Once it was clamped and the  black flap snapped in place, it was pulled over to the right and then  three sutures plicated the stomach anteriorly and then and antislip  stitch was placed along  lesser curvature side between the wrapped  stomach and the lesser curve.  These were all secured with tie knots.  Everything looked to be good and the tubing was then brought through the  port on the right lower side.  This was expanded and connected to a port  which was then sewn  to the fascia with 4 interrupted sutures of 2-0 Prolene.  Wounds were  irrigated out.  Wounds were closed with 4-0 Vicryl and Benzoin and Steri-  Strips.  The patient seemed to tolerate the procedure well and was taken  to the recovery room in satisfactory condition.      Thornton Park Daphine Deutscher, MD  Electronically Signed     MBM/MEDQ  D:  10/12/2007  T:  10/12/2007  Job:  [id number]   cc:   Leanne Chang, M.D.  Fax: [phone]

## 2010-07-20 NOTE — Discharge Summary (Signed)
[facility]  Patient:    [name], [name]               MRN: [id number] Adm. Date:  [id number] Disc. Date: [id number] Attending:  Trevor Iha                           Discharge Summary  HISTORY OF PRESENT ILLNESS:   [name] is a 40 year old, nulligravida, black female with a known history of leiomyomas, status post myomectomy in 1999. She has had worsening menorrhagia over the last year.  She is controlling the bleeding with low-dose birth control pills three times a day.  She does have future fertility desires and presents for myomectomy.  Ultrasound shows several large fibroids, the largest being 6.5 cm in size.  The preoperative hemoglobin was 13.0.  HOSPITAL COURSE:              The patient was admitted and underwent a laparotomy with myomectomy and lysis of adhesions.  The surgery was uncomplicated, removing multiple fibroids, the largest approximately 6.5 cm in size.  The estimated blood loss during the procedure was 150 cc.  Her postoperative course was good with a postoperative hemoglobin on day #1 of 10.9.  She had good return of bowel function and was ambulating without difficulty by postoperative day #2, passing flatus and tolerating a regular diet.  The incision was clean, dry, and intact.  DISPOSITION:                  The patient was discharged home.  FOLLOW-UP:                    Will follow up in the office in three days for staple removal.  DISCHARGE MEDICATIONS:        She is sent home with a prescription for Tylox #30 and Motrin 800 mg #30.  ACTIVITY:                     Light activity.  No driving for two weeks.  DISCHARGE CONDITION:          Good. DD:  09/13/00 TD:  09/13/00 Job: [id number]

## 2010-07-20 NOTE — Op Note (Signed)
[facility]  Patient:    [name], [name]               MRN: [id number] Proc. Date: 09/11/00 Adm. Date:  [id number] Attending:  Trevor Iha                           Operative Report  PREOPERATIVE DIAGNOSES:       1. Menometrorrhagia.                               2. Leiomyomatous uterus.  POSTOPERATIVE DIAGNOSES:      1. Menometrorrhagia.                               2. Leiomyomatous uterus.                               3. Adhesive disease.  PROCEDURE:                    Laparotomy with multiple myomectomies and                               lysis of adhesions.  SURGEON:                      Trevor Iha, M.D.  ASSISTANT:                    Freddy Finner, M.D.  ANESTHESIA:                   General endotracheal.  ESTIMATED BLOOD LOSS:         150 cc.  INDICATIONS:                  [name] is a 40 year old multigravida black female with worsening menometrorrhagia, history of myomectomy three years ago. She now has an enlarged uterus, several fibroids, the largest measuring 6.5 cm by ultrasound. She bleeds daily and currently is on oral contraceptive agent at three pills a day just to control her bleeding. She presents today for laparotomy with myomectomies. Risk and benefits were discussed at length. Informed consent was obtained. See history and physical for further details.  FINDINGS AT TIME OF SURGERY:  Grossly enlarged uterus, multiple fibroids, largest was 6.5 cm fibroid which was located within the endometrium. All notable fibroids were removed with the exception of one at the left uterine fundus just below the isthmus of the left fallopian tube, measuring approximately about 1.5 cm. It was left due to close proximity to the left fallopian tube. She otherwise had normal-appearing tubes, ovaries, and appendix.  DESCRIPTION OF PROCEDURE:            After adequate analgesia, the patient placed in the supine  position. She was sterilely prepped and draped. Bladder was sterilely drained with a Foley catheter. A Pfannenstiel skin incision was made through the previous incision. It was taken down sharply to the midline. The fascia was incised and dissected laterally and superiorly and inferiorly off the bellies of the rectus muscle which were separated sharply in midline. The peritoneum was entered sharply. Omentum was adhesed to the peritoneum and also to the fundus,  tubes, and ovaries of the uterus. Gentle sharp dissection with Metzenbaum scissors was performed and hemostasis was achieved with Bovie cautery, care taken to avoid bowel and major blood vessels. After careful dissection, the uterus was elevated through the incision, packed with wet packs, and multiple myomectomies were carried out using Bovie cautery. Approximately 10 fibroids were removed with incisions along the anterior and posterior surface with three of them through to the endometrial canal. The myotomies were closed with deep sutures of 0 Monocryl in a figure-of-eight, and the serosa was closed with 3-0 PDS in a running locking fashion. After all notable fibroids were removed with the exception of the one at the left tubal isthmus area, hemostasis was noted to be achieved at all incisions. After copious amount of irrigation and adequate hemostasis was achieved, Interceed adhesive barrier was placed both posteriorly and anteriorly. ______ was carefully replaced back into the pelvis. The peritoneum was then closed with 0 Monocryl. The fascia was then closed with single suture of 0 Panacryl in a running fashion with good approximation and good hemostasis. Irrigation was once again applied, and after adequate hemostasis, the skin was stapled and Steri-Strips applied. The patient received one gram of Cefotetan preoperatively. Sponge and instrument count was normal x 3. Estimated blood loss during the procedure was 150 cc. DD:   09/11/00 TD:  09/11/00 Job: [id number]

## 2010-07-20 NOTE — Op Note (Signed)
[name], [name]              ACCOUNT NO.:  [id number]   MEDICAL RECORD NO.:  [id number]          PATIENT TYPE:  INP   LOCATION:  9198                          FACILITY:  [facility]   PHYSICIAN:  Dineen Kid. Rana Snare, M.D.    DATE OF BIRTH:  December 11, 1970   DATE OF PROCEDURE:  12/16/2003  DATE OF DISCHARGE:                                 OPERATIVE REPORT   PREOPERATIVE DIAGNOSES:  1.  Intrauterine pregnancy at 38-1/2 weeks.  2.  Previous myomectomy.  3.  Chronic hypertension.  4.  Oligohydramnios.   POSTOPERATIVE DIAGNOSES:  1.  Intrauterine pregnancy at 38-1/2 weeks.  2.  Previous myomectomy.  3.  Chronic hypertension.  4.  Oligohydramnios.   PROCEDURE:  Primary low cervical transverse cesarean section.   SURGEON:  Dineen Kid. Rana Snare, M.D.   ASSISTANTFreddy Finner, M.D.   ANESTHESIA:  Spinal.   INDICATIONS:  [name] is a 40 year old G1 at 38+ weeks who presents for  primary low transverse cervical cesarean section.  Pregnancy has been  complicated by chronic hypertension, borderline oligohydramnios, and  previous myomectomy.  Her EDC is January 01, 2004.  Risks and benefits were  discussed and informed consent was obtained.   </FINDINGS AT THE TIME OF SURGERY>  Viable female infant, Apgars 8 and 9.  Arterial pH 7.28.  Weight 5 pounds 14  ounces.   DESCRIPTION OF PROCEDURE:  After adequate analgesia, the patient was placed  in the supine position with left lateral tilt.  She was sterilely prepped  and draped.  The bladder was sterilely drained with a Foley catheter.  Pfannenstiel skin incision was made two fingerbreadths above the pubic  symphysis and taken down sharply to the fascia which was incised  transversely extending superiorly and inferiorly off the bellies of the  rectus muscles which were separated sharply in the midline.  The peritoneum  was entered sharply.  Low cervical myotomy incision was made down to the  amniotic sac and extended laterally with the  operator's fingertips.  Amniotomy was performed.  The infant's vertex was delivered with one pull of  the vacuum extractor.  Nares and pharynx suction.  The cord was clamped, cut  and the baby girl was handed to the pediatricians for resuscitation.  Cord  blood was obtained.  The placenta was extracted manually.  The uterus was  wiped clean with a dry lap.  Myotomy incision was closed in two layers,  first with a running locking and the second with an imbricating layer of 0  Monocryl suture.  Copious irrigation was applied and after adequate  hemostasis was assured, the peritoneum was closed with 0 Monocryl.  The  rectus muscles were plicated in the midline.  Irrigation was applied and  after adequate hemostasis, the fascia was closed with a single suture of 0  PDS in a running fashion.  Irrigation was applied and after adequate  hemostasis, the skin was stapled and Steri-Strips were applied.  The patient  tolerated the procedure well and was stable on transfer to the recovery  room.  Sponge, needle and instrument counts were  normal x3.  Estimated blood  loss was 800 mL.  The patient received 1 g of Rocephin after delivery of the  placenta.      DCL/MEDQ  D:  12/16/2003  T:  12/16/2003  Job:  [id number]

## 2010-07-20 NOTE — H&P (Signed)
NAMEMILEA, [name]              ACCOUNT NO.:  [id number]   MEDICAL RECORD NO.:  [id number]          PATIENT TYPE:  INP   LOCATION:  NA                            FACILITY:  [facility]   PHYSICIAN:  Dineen Kid. Rana Snare, M.D.    DATE OF BIRTH:  [birth year]   DATE OF ADMISSION:  12/16/2003  DATE OF DISCHARGE:                                HISTORY & PHYSICAL   HISTORY OF PRESENT ILLNESS:  Ms. [name] is a 40 year old, G1 at [redacted] weeks  gestational age who presents for primary low segment transverse cesarean  section. Her estimated date of confinement was January 01, 2004.  Her  pregnancy has been complicated by chronic hypertension and also previous  myomectomy. She has also had borderline oligohydramnios which is improved  with bed rest so she has been on Norvasc at 10 mg q.d. to control her  hypertension and again presents for primary low segment transverse cesarean  section.   PAST MEDICAL HISTORY:  Significant for myomectomy in October of 1999 and in  July of 2002. She has also had knee surgery.  Chronic hypertension.   MEDICATIONS:  Norvasc 10 mg q.d.   ALLERGIES:  She has no known drug allergies.   PHYSICAL EXAMINATION:  VITAL SIGNS:  Blood pressure is 128/70.  HEART:  Regular rate and rhythm.  LUNGS:  Clear to auscultation.  ABDOMEN:  Gravid, nontender. Fundal height is 40 cm. Her weight is 292.5.  Her cervix is closed, thick and high.   IMPRESSION:  1.  Intrauterine pregnancy at 38 weeks.  2.  Chronic hypertension.  3.  Oligohydramnios.  4.  Previous myomectomy.   PLAN:  Primary low segment transverse cesarean section. The risk and  benefits were discussed which include but are limited to risk of infection,  bleeding, damage to uterus, tubes, ovaries, bowel, bladder, fetus. She does  give her informed consent and wished to proceed.      DCL/MEDQ  D:  12/15/2003  T:  12/15/2003  Job:  [id number]

## 2010-07-20 NOTE — H&P (Signed)
[facility]  Patient:    [name], [name]               MRN: [id number] Adm. Date:  [id number] Attending:  Trevor Iha                         History and Physical  HISTORY OF PRESENT ILLNESS:   [name] is a 40 year old nulligravida black female with a known history of leiomyoma status post myomectomy in 1999 who over the last year has worsening menomenorrhagia currently on low dose birth control pills three a day to control the bleeding.  She does have future fertility desires and presents today for myomectomy.  Ultrasound recently showed the uterus to measure 13 x 10 x 7 with the largest fibroid measuring 6.5 cm with several other submucosal fibroids.  Hemoglobin preoperatively is 13.0.  PAST MEDICAL HISTORY:         Significant for hypertension controlled on Norvasc.  PAST SURGICAL HISTORY:        Myomectomy in 1999.  MEDICATIONS:                  Alesse, Allegra, Norvasc 10 mg q.d.  ALLERGIES:                    No known drug allergies.  PHYSICAL EXAMINATION  VITAL SIGNS:                  Blood pressure 138/90.  HEART:                        Regular rate and rhythm.  LUNGS:                        Clear to auscultation bilaterally.  ABDOMEN:                      Mildly obese, nontender.  PELVIC:                       Enlarged uterus approximately 10-12 weeks size, nontender to palpation.  Cervix is within normal limits.  IMPRESSION AND PLAN:          Probable leiomyomatous uterus, menomenorrhagia not controlled with conservative medical management.  Patient desires future fertility.  Has had a previous myomectomy.  Presents today for laparotomy with myomectomy.  Risks and benefits were discussed at length including, but not limited to, risk of infection, bleeding, damage to uterus, tubes, ovaries, bowel, bladder, need for cesarean section in the future, also risks associated with blood transfusion including risk of  hepatitis, AIDS, or reaction to the blood transfusion.  She does give her informed consent. DD:  09/11/00 TD:  09/11/00 Job: [id number]

## 2010-07-20 NOTE — Discharge Summary (Signed)
[name], [name]              ACCOUNT NO.:  [id number]   MEDICAL RECORD NO.:  [id number]          PATIENT TYPE:  INP   LOCATION:  9135                          FACILITY:  [facility]   PHYSICIAN:  Guy Sandifer. Henderson Cloud, M.D. DATE OF BIRTH:  28-Sep-1970   DATE OF ADMISSION:  12/16/2003  DATE OF DISCHARGE:  12/19/2003                                 DISCHARGE SUMMARY   ADMITTING DIAGNOSES:  1.  Intrauterine pregnancy at 38-and-a-half weeks estimated gestational age.  2.  Previous myomectomy.  3.  Chronic hypertension.  4.  Borderline oligohydramnios.   DISCHARGE DIAGNOSES:  1.  Status post low transverse cesarean section.  2.  Viable female infant.  3.  Chronic hypertension.   PROCEDURE:  Primary low transverse cesarean section.   REASON FOR ADMISSION:  Please see dictated H&P.   HOSPITAL COURSE:  The patient was a 40 year old primigravida at 38-and-a-  half weeks estimated gestational age who presented to [facility] for a scheduled cesarean delivery.  Pregnancy had been complicated  by chronic hypertension, borderline oligohydramnios, and previous  myomectomy.  On the morning of admission, the patient was taken to the  operating room where spinal anesthesia was administered without difficulty.  A low transverse incision was made with the delivery of a viable female  infant weighing 5 pounds 14 ounces with Apgars of 8 at one minute and 9 at  five minutes.  Arterial cord pH was 7.28.  The patient tolerated the  procedure well and was taken to the recovery room in stable condition.  On  postoperative day #1, the patient was without complaint.  Vital signs were  stable, she was afebrile, blood pressure 142/87.  Abdominal dressing had  been removed revealing an incision that was clean, dry, and intact.  Labs  revealed hemoglobin of 11.6.  The patient was restarted on her Norvasc.  On  postoperative day #2, vital signs were stable, she was afebrile, blood  pressure 147/86.   Abdomen soft, fundus firm.  Incision was clean, dry, and  intact.  The patient was ambulating well.  On postoperative day #3, the  patient was without complaint.  She denied headache or blurred vision.  Vital signs were stable, she was afebrile.  Abdomen was soft, good return of  bowel function.  Fundus was firm, somewhat irregular in shape suggestive of  fibroid.  Incision was clean, dry, and intact.  Staples were removed and the  patient was discharged home.   CONDITION ON DISCHARGE:  Good.   DIET:  Regular as tolerated.   ACTIVITY:  No heavy lifting, no driving x2 weeks, no vaginal entry.   FOLLOW-UP:  The patient is to return to the office in 3-4 days for a blood  pressure check and an incision check.  She is to call for temperature  greater than 100 degrees, persistent nausea and vomiting, heavy vaginal  bleeding, and/or redness or drainage over the incisional site.  The patient  was also instructed to call for headache unrelieved by Tylenol, blurred  vision, or right upper quadrant pain.   DISCHARGE MEDICATIONS:  1.  Tylox #30 one p.o. q.4-6h. p.r.n.  2.  Motrin 600 mg one q.6h.  3.  Prenatal vitamins one p.o. daily.  4.  Norvasc 10 mg one p.o. daily.      CC/MEDQ  D:  01/05/2004  T:  01/05/2004  Job:  [id number]

## 2010-08-14 ENCOUNTER — Other Ambulatory Visit: Payer: Self-pay | Admitting: Family Medicine

## 2010-08-14 MED ORDER — LEVOTHYROXINE SODIUM 112 MCG PO TABS: 112.0000 ug | ORAL_TABLET | Freq: Every day | ORAL | Status: DC

## 2010-08-14 NOTE — Telephone Encounter (Signed)
Rx faxed.    KP 

## 2010-08-31 ENCOUNTER — Encounter (INDEPENDENT_AMBULATORY_CARE_PROVIDER_SITE_OTHER): Payer: Self-pay

## 2010-11-30 LAB — CBC
HCT: 36.8
MCV: 90.1
RBC: 4.08
WBC: 8.7

## 2010-11-30 LAB — BASIC METABOLIC PANEL
BUN: 18
CO2: 30
Calcium: 9.9
Chloride: 101
Creatinine, Ser: 0.8
Glucose, Bld: 94

## 2010-11-30 LAB — DIFFERENTIAL
Eosinophils Absolute: 0
Eosinophils Relative: 0
Lymphs Abs: 1.3
Monocytes Relative: 4

## 2010-11-30 LAB — HEMOGLOBIN AND HEMATOCRIT, BLOOD: HCT: 37.2

## 2010-12-25 ENCOUNTER — Other Ambulatory Visit: Payer: Self-pay | Admitting: Family Medicine

## 2010-12-25 MED ORDER — AMLODIPINE BESYLATE 10 MG PO TABS: 10.0000 mg | ORAL_TABLET | Freq: Every day | ORAL | Status: DC

## 2010-12-25 NOTE — Telephone Encounter (Signed)
Addended by: Arnette Norris on: 12/25/2010 05:03 PM   Modules accepted: Orders

## 2010-12-25 NOTE — Telephone Encounter (Signed)
Faxed     Kp 

## 2011-01-04 ENCOUNTER — Encounter (INDEPENDENT_AMBULATORY_CARE_PROVIDER_SITE_OTHER): Payer: Self-pay

## 2011-01-30 ENCOUNTER — Other Ambulatory Visit: Payer: Self-pay | Admitting: Family Medicine

## 2011-01-31 MED ORDER — AMLODIPINE BESYLATE 10 MG PO TABS: 10.0000 mg | ORAL_TABLET | Freq: Every day | ORAL | Status: DC

## 2011-01-31 NOTE — Telephone Encounter (Signed)
Rx never filed her and not on med list--Letter mailed for patient to schedule a cpe    KP

## 2011-01-31 NOTE — Telephone Encounter (Signed)
The Rx is for Norvasc    KP

## 2011-01-31 NOTE — Telephone Encounter (Signed)
Addended by: Arnette Norris on: 01/31/2011 09:00 AM   Modules accepted: Orders

## 2011-02-05 ENCOUNTER — Telehealth: Payer: Self-pay

## 2011-02-05 MED ORDER — AMLODIPINE BESYLATE 10 MG PO TABS: 10.0000 mg | ORAL_TABLET | Freq: Every day | ORAL | Status: DC

## 2011-02-05 NOTE — Telephone Encounter (Signed)
7 day supply faxed to pharmacy    KP

## 2011-03-07 ENCOUNTER — Encounter (INDEPENDENT_AMBULATORY_CARE_PROVIDER_SITE_OTHER): Payer: Self-pay | Admitting: Surgery

## 2011-03-08 ENCOUNTER — Ambulatory Visit (INDEPENDENT_AMBULATORY_CARE_PROVIDER_SITE_OTHER): Payer: Managed Care, Other (non HMO) | Admitting: Physician Assistant

## 2011-03-08 ENCOUNTER — Encounter (INDEPENDENT_AMBULATORY_CARE_PROVIDER_SITE_OTHER): Payer: Self-pay

## 2011-03-08 ENCOUNTER — Encounter (INDEPENDENT_AMBULATORY_CARE_PROVIDER_SITE_OTHER): Payer: Self-pay | Admitting: Surgery

## 2011-03-08 VITALS — BP 122/84 | HR 68 | Temp 97.8°F | Resp 18 | Ht 66.0 in | Wt 187.5 lb

## 2011-03-08 DIAGNOSIS — E669 Obesity, unspecified: Secondary | ICD-10-CM

## 2011-03-08 DIAGNOSIS — Z4651 Encounter for fitting and adjustment of gastric lap band: Secondary | ICD-10-CM

## 2011-03-08 NOTE — Patient Instructions (Signed)
Take clear liquids for the next 48 hours. Thin protein shakes are ok to start on Saturday evening. Call us if you have persistent vomiting or regurgitation, night cough or reflux symptoms. Return as scheduled or sooner if you notice no changes in hunger/portion sizes.   

## 2011-03-08 NOTE — Progress Notes (Signed)
  HISTORY: [name] is a 41 y.o.female who received an AP-Standard lap-band in August 2009 by Dr. Daphine Deutscher. She's noticed increasing hunger and portion sizes over the past month. No regurgitation or vomiting.  VITAL SIGNS: Filed Vitals:   03/08/11 1204  BP: 122/84  Pulse: 68  Temp: 97.8 F (36.6 C)  Resp: 18    PHYSICAL EXAM: Physical exam reveals a very well-appearing 41 y.o.female in no apparent distress Neurologic: Awake, alert, oriented Psych: Bright affect, conversant Respiratory: Breathing even and unlabored. No stridor or wheezing Abdomen: Soft, nontender, nondistended to palpation. Incisions well-healed. No incisional hernias. Port easily palpated. Extremities: Atraumatic, good range of motion.  ASSESMENT: 41 y.o.  female  s/p AP-Standard lap-band.   PLAN: The patient's port was accessed with a 20G Huber needle without difficulty. Clear fluid was aspirated and 0.25 mL saline was added to the port to give a total predicted volume of 4.75 mL. The patient was able to swallow water without difficulty following the procedure and was instructed to take clear liquids for the next 24-48 hours and advance slowly as tolerated.

## 2011-03-14 ENCOUNTER — Ambulatory Visit (INDEPENDENT_AMBULATORY_CARE_PROVIDER_SITE_OTHER): Payer: Managed Care, Other (non HMO) | Admitting: Family Medicine

## 2011-03-14 ENCOUNTER — Encounter: Payer: Self-pay | Admitting: Family Medicine

## 2011-03-14 VITALS — BP 120/80 | HR 76 | Temp 98.1°F | Ht 65.0 in | Wt 187.2 lb

## 2011-03-14 DIAGNOSIS — E039 Hypothyroidism, unspecified: Secondary | ICD-10-CM

## 2011-03-14 DIAGNOSIS — Z23 Encounter for immunization: Secondary | ICD-10-CM

## 2011-03-14 DIAGNOSIS — G4700 Insomnia, unspecified: Secondary | ICD-10-CM

## 2011-03-14 DIAGNOSIS — I10 Essential (primary) hypertension: Secondary | ICD-10-CM

## 2011-03-14 DIAGNOSIS — Z0000 Encounter for general adult medical examination without abnormal findings: Secondary | ICD-10-CM

## 2011-03-14 LAB — LIPID PANEL
Cholesterol: 230 mg/dL — ABNORMAL HIGH (ref 0–200)
HDL: 87.7 mg/dL (ref >39.00)
Total CHOL/HDL Ratio: 3
Triglycerides: 71 mg/dL (ref 0.0–149.0)
VLDL: 14.2 mg/dL (ref 0.0–40.0)

## 2011-03-14 LAB — BASIC METABOLIC PANEL
BUN: 9 mg/dL (ref 6–23)
Calcium: 9 mg/dL (ref 8.4–10.5)
GFR: 132.61 mL/min (ref >60.00)
Glucose, Bld: 81 mg/dL (ref 70–99)
Potassium: 3.8 mEq/L (ref 3.5–5.1)

## 2011-03-14 LAB — CBC WITH DIFFERENTIAL/PLATELET
Basophils Absolute: 0 10*3/uL (ref 0.0–0.1)
Eosinophils Absolute: 0.2 10*3/uL (ref 0.0–0.7)
HCT: 37 % (ref 36.0–46.0)
Lymphs Abs: 1.8 10*3/uL (ref 0.7–4.0)
MCHC: 33 g/dL (ref 30.0–36.0)
Monocytes Relative: 7.3 % (ref 3.0–12.0)
Platelets: 245 10*3/uL (ref 150.0–400.0)
RDW: 13.8 % (ref 11.5–14.6)

## 2011-03-14 LAB — HEPATIC FUNCTION PANEL
AST: 16 U/L (ref 0–37)
Total Bilirubin: 0.4 mg/dL (ref 0.3–1.2)

## 2011-03-14 MED ORDER — ZOLPIDEM TARTRATE 10 MG PO TABS: 10.0000 mg | ORAL_TABLET | Freq: Every evening | ORAL | Status: DC | PRN

## 2011-03-14 MED ORDER — AMLODIPINE BESYLATE 10 MG PO TABS: 10.0000 mg | ORAL_TABLET | Freq: Every day | ORAL | Status: DC

## 2011-03-14 NOTE — Patient Instructions (Addendum)
Preventative Care for Adults, Female A healthy lifestyle and preventative care can promote health and wellness. Preventative health guidelines for women include the following key practices:  A routine yearly physical is a good way to check with your caregiver about your health and preventative screening. It is a chance to share any concerns and updates on your health, and to receive a thorough exam.   Visit your dentist for a routine exam and preventative care every 6 months. Brush your teeth twice a day and floss once a day. Good oral hygiene prevents tooth decay and gum disease.   The frequency of eye exams is based on your age, health, family medical history, use of contact lenses, and other factors. Follow your caregiver's recommendations for frequency of eye exams.   Eat a healthy diet. Foods like vegetables, fruits, whole grains, low-fat dairy products, and lean protein foods contain the nutrients you need without too many calories. Decrease your intake of foods high in solid fats, added sugars, and salt. Eat the right amount of calories for you.Get information about a proper diet from your caregiver, if necessary.   Regular physical exercise is one of the most important things you can do for your health. Most adults should get at least 150 minutes of moderate-intensity exercise (any activity that increases your heart rate and causes you to sweat) each week. In addition, most adults need muscle-strengthening exercises on 2 or more days a week.   Maintain a healthy weight. The body mass index (BMI) is a screening tool to identify possible weight problems. It provides an estimate of body fat based on height and weight. Your caregiver can help determine your BMI, and can help you achieve or maintain a healthy weight.For adults 20 years and older:   A BMI below 18.5 is considered underweight.   A BMI of 18.5 to 24.9 is normal.   A BMI of 25 to 29.9 is considered overweight.   A BMI of 30 and  above is considered obese.   Maintain normal blood lipids and cholesterol levels by exercising and minimizing your intake of saturated fat. Eat a balanced diet with plenty of fruit and vegetables. Blood tests for lipids and cholesterol should begin at age 20 and be repeated every 5 years. If your lipid or cholesterol levels are high, you are over 50, or you are a high risk for heart disease, you may need your cholesterol levels checked more frequently.Ongoing high lipid and cholesterol levels should be treated with medicines if diet and exercise are not effective.   If you smoke, find out from your caregiver how to quit. If you do not use tobacco, do not start.   If you are pregnant, do not drink alcohol. If you are breastfeeding, be very cautious about drinking alcohol. If you are not pregnant and choose to drink alcohol, do not exceed 1 drink per day. One drink is considered to be 12 ounces (355 mL) of beer, 5 ounces (148 mL) of wine, or 1.5 ounces (44 mL) of liquor.   Avoid use of street drugs. Do not share needles with anyone. Ask for help if you need support or instructions about stopping the use of drugs.   High blood pressure causes heart disease and increases the risk of stroke. Your blood pressure should be checked at least every 1 to 2 years. Ongoing high blood pressure should be treated with medicines if weight loss and exercise are not effective.   If you are 55 to 40   years old, ask your caregiver if you should take aspirin to prevent strokes.   Diabetes screening involves taking a blood sample to check your fasting blood sugar level. This should be done once every 3 years, after age 45, if you are within normal weight and without risk factors for diabetes. Testing should be considered at a younger age or be carried out more frequently if you are overweight and have at least 1 risk factor for diabetes.   Breast cancer screening is essential preventative care for women. You should  practice "breast self-awareness." This means understanding the normal appearance and feel of your breasts and may include breast self-examination. Any changes detected, no matter how small, should be reported to a caregiver. Women in their 20s and 30s should have a clinical breast exam (CBE) by a caregiver as part of a regular health exam every 1 to 3 years. After age 40, women should have a CBE every year. Starting at age 40, women should consider having a mammogram (breast X-ray) every year. Women who have a family history of breast cancer should talk to their caregiver about genetic screening. Women at a high risk of breast cancer should talk to their caregiver about having an MRI and a mammogram every year.   The Pap test is a screening test for cervical cancer. A Pap test can show cell changes on the cervix that might become cervical cancer if left untreated. A Pap test is a procedure in which cells are obtained and examined from the lower end of the uterus (cervix).   Women should have a Pap test starting at age 21.   Between ages 21 and 29, Pap tests should be repeated every 2 years.   Beginning at age 30, you should have a Pap test every 3 years as long as the past 3 Pap tests have been normal.   Some women have medical problems that increase the chance of getting cervical cancer. Talk to your caregiver about these problems. It is especially important to talk to your caregiver if a new problem develops soon after your last Pap test. In these cases, your caregiver may recommend more frequent screening and Pap tests.   The above recommendations are the same for women who have or have not gotten the vaccine for human papillomavirus (HPV).   If you had a hysterectomy for a problem that was not cancer or a condition that could lead to cancer, then you no longer need Pap tests. Even if you no longer need a Pap test, a regular exam is a good idea to make sure no other problems are starting.   If you  are between ages 65 and 70, and you have had normal Pap tests going back 10 years, you no longer need Pap tests. Even if you no longer need a Pap test, a regular exam is a good idea to make sure no other problems are starting.   If you have had past treatment for cervical cancer or a condition that could lead to cancer, you need Pap tests and screening for cancer for at least 20 years after your treatment.   If Pap tests have been discontinued, risk factors (such as a new sexual partner) need to be reassessed to determine if screening should be resumed.   The HPV test is an additional test that may be used for cervical cancer screening. The HPV test looks for the virus that can cause the cell changes on the cervix. The cells collected   during the Pap test can be tested for HPV. The HPV test could be used to screen women aged 30 years and older, and should be used in women of any age who have unclear Pap test results. After the age of 30, women should have HPV testing at the same frequency as a Pap test.   Colorectal cancer can be detected and often prevented. Most routine colorectal cancer screening begins at the age of 50 and continues through age 75. However, your caregiver may recommend screening at an earlier age if you have risk factors for colon cancer. On a yearly basis, your caregiver may provide home test kits to check for hidden blood in the stool. Use of a small camera at the end of a tube, to directly examine the colon (sigmoidoscopy or colonoscopy), can detect the earliest forms of colorectal cancer. Talk to your caregiver about this at age 50, when routine screening begins. Direct examination of the colon should be repeated every 5 to 10 years through age 75, unless early forms of pre-cancerous polyps or small growths are found.   Practice safe sex. Use condoms and avoid high-risk sexual practices to reduce the spread of sexually transmitted infections (STIs). STIs include gonorrhea,  chlamydia, syphilis, trichomonas, herpes, HPV, and human immunodeficiency virus (HIV). Herpes, HIV, and HPV are viral illnesses that have no cure. They can result in disability, cancer, and death. Sexually active women aged 25 and younger should be checked for Chlamydia. Older women with new or multiple partners should also be tested for Chlamydia. Testing for other STIs is recommended if you are sexually active and at increased risk.   Osteoporosis is a disease in which the bones lose minerals and strength with aging. This can result in serious bone fractures. The risk of osteoporosis can be identified using a bone density scan. Women ages 65 and over and women at risk for fractures or osteoporosis should discuss screening with their caregivers. Ask your caregiver whether you should take a calcium supplement or vitamin D to reduce the rate of osteoporosis.   Menopause can be associated with physical symptoms and risks. Hormone replacement therapy is available to decrease symptoms and risks. You should talk to your caregiver about whether hormone replacement therapy is right for you.   Use sunscreen with skin protection factor (SPF) of 30 or more. Apply sunscreen liberally and repeatedly throughout the day. You should seek shade when your shadow is shorter than you. Protect yourself by wearing long sleeves, pants, a wide-brimmed hat, and sunglasses year round, whenever you are outdoors.   Once a month, do a whole body skin exam, using a mirror to look at the skin on your back. Notify your caregiver of new moles, moles that have irregular borders, moles that are larger than a pencil eraser, or moles that have changed in shape or color.   Stay current with required immunizations.   Influenza. You need a dose every fall (or winter). The composition of the flu vaccine changes each year, so being vaccinated once is not enough.   Pneumococcal polysaccharide. You need 1 to 2 doses if you smoke cigarettes or  if you have certain chronic medical conditions. You need 1 dose at age 65 (or older) if you have never been vaccinated.   Tetanus, diphtheria, pertussis (Tdap, Td). Get 1 dose of Tdap vaccine if you are younger than age 65 years, are over 65 and have contact with an infant, are a healthcare worker, are pregnant, or simply want   to be protected from whooping cough. After that, you need a Td booster dose every 10 years. Consult your caregiver if you have not had at least 3 tetanus and diphtheria-containing shots sometime in your life or have a deep or dirty wound.   HPV. You need this vaccine if you are a woman age 26 years or younger. The vaccine is given in 3 doses over 6 months.   Measles, mumps, rubella (MMR). You need at least 1 dose of MMR if you were born in 1957 or later. You may also need a 2nd dose.   Meningococcal. If you are age 19 to 21 years and a first-year college student living in a residence hall, or have one of several medical conditions, you need to get vaccinated against meningococcal disease. You may also need additional booster doses.   Zoster (shingles). If you are age 60 years or older, you should get this vaccine.   Varicella (chickenpox). If you have never had chickenpox or you were vaccinated but received only 1 dose, talk to your caregiver to find out if you need this vaccine.   Hepatitis A. You need this vaccine if you have a specific risk factor for hepatitis A virus infection or you simply wish to be protected from this disease. The vaccine is usually given as 2 doses, 6 to 18 months apart.   Hepatitis B. You need this vaccine if you have a specific risk factor for hepatitis B virus infection or you simply wish to be protected from this disease. The vaccine is given in 3 doses, usually over 6 months.  Preventative Services / Frequency Ages 19 to 39  Blood pressure check.** / Every 1 to 2 years.   Lipid and cholesterol check.**/ Every 5 years beginning at age 20.    Clinical breast exam.** / Every 3 years for women in their 20s and 30s.   Pap Test.** / Every 2 years from ages 21 through 29. Every 3 years starting at age 30 years through age 65 or 70 with a history of 3 consecutive normal Pap tests.   HPV Screening.** / Every 3 years from ages 30 through ages 65 to 70 with a history of 3 consecutive normal Pap tests.   Skin self-exam. / Monthly.   Influenza immunization.** / Every year.   Pneumococcal polysaccharide immunization.** / 1 to 2 doses if you smoke cigarettes or if you have certain chronic medical conditions.   Tetanus, diphtheria, pertussis (Tdap,Td) immunization. / A one-time dose of Tdap vaccine. After that, you need a Td booster dose every 10 years.   HPV immunization. / 3 doses over 6 months, if 26 and younger.   Measles, mumps, rubella (MMR) immunization. / You need at least 1 dose of MMR if you were born in 1957 or later. You may also need a 2nd dose.   Meningococcal immunization. / 1 dose if you are age 19 to 21 years and a first-year college student living in a residence hall, or have one of several medical conditions, you need to get vaccinated against meningococcal disease. You may also need additional booster doses.   Varicella immunization. **/ Consult your caregiver.   Hepatitis A immunization. ** / Consult your caregiver. 2 doses, 6 to 18 months apart.   Hepatitis B immunization.** / Consult your caregiver. 3 doses usually over 6 months.  Ages 40 to 64  Blood pressure check.** / Every 1 to 2 years.   Lipid and cholesterol check.**/ Every 5 years beginning   at age 20.   Clinical breast exam.** / Every year after age 40.   Mammogram.** / Every year beginning at age 40 and continuing for as long as you are in good health. Consult with your caregiver.   Pap Test.** / Every 3 years starting at age 30 years through age 65 or 70 with a history of 3 consecutive normal Pap tests.   HPV Screening.** / Every 3 years from  ages 30 through ages 65 to 70 with a history of 3 consecutive normal Pap tests.   Fecal occult blood test (FOBT) of stool. / Every year beginning at age 50 and continuing until age 75. You may not have to do this test if you get colonoscopy every 10 years.   Flexible sigmoidoscopy** or colonoscopy.** / Every 5 years for a flexible sigmoidoscopy or every 10 years for a colonoscopy beginning at age 50 and continuing until age 75.   Skin self-exam. / Monthly.   Influenza immunization.** / Every year.   Pneumococcal polysaccharide immunization.** / 1 to 2 doses if you smoke cigarettes or if you have certain chronic medical conditions.   Tetanus, diphtheria, pertussis (Tdap/Td) immunization.** / A one-time dose of Tdap vaccine. After that, you need a Td booster dose every 10 years.   Measles, mumps, rubella (MMR) immunization. / You need at least 1 dose of MMR if you were born in 1957 or later. You may also need a 2nd dose.   Varicella immunization. **/ Consult your caregiver.   Meningococcal immunization.** / Consult your caregiver.     Hepatitis A immunization. ** / Consult your caregiver. 2 doses, 6 to 18 months apart.   Hepatitis B immunization.** / Consult your caregiver. 3 doses, usually over 6 months.  Ages 65 and over  Blood pressure check.** / Every 1 to 2 years.   Lipid and cholesterol check.**/ Every 5 years beginning at age 20.   Clinical breast exam.** / Every year after age 40.   Mammogram.** / Every year beginning at age 40 and continuing for as long as you are in good health. Consult with your caregiver.   Pap Test,** / Every 3 years starting at age 30 years through age 65 or 70 with a 3 consecutive normal Pap tests. Testing can be stopped between 65 and 70 with 3 consecutive normal Pap tests and no abnormal Pap or HPV tests in the past 10 years.   HPV Screening.** / Every 3 years from ages 30 through ages 65 or 70 with a history of 3 consecutive normal Pap tests.  Testing can be stopped between 65 and 70 with 3 consecutive normal Pap tests and no abnormal Pap or HPV tests in the past 10 years.   Fecal occult blood test (FOBT) of stool. / Every year beginning at age 50 and continuing until age 75. You may not have to do this test if you get colonoscopy every 10 years.   Flexible sigmoidoscopy** or colonoscopy.** / Every 5 years for a flexible sigmoidoscopy or every 10 years for a colonoscopy beginning at age 50 and continuing until age 75.   Osteoporosis screening.** / A one-time screening for women ages 65 and over and women at risk for fractures or osteoporosis.   Skin self-exam. / Monthly.   Influenza immunization.** / Every year.   Pneumococcal polysaccharide immunization.** / 1 dose at age 65 (or older) if you have never been vaccinated.   Tetanus, diphtheria, pertussis (Tdap, Td) immunization. / A one-time dose of Tdap   vaccine if you are over 65 and have contact with an infant, are a Research scientist (physical sciences), or simply want to be protected from whooping cough. After that, you need a Td booster dose every 10 years.   Varicella immunization. **/ Consult your caregiver.   Meningococcal immunization.** / Consult your caregiver.   Hepatitis A immunization. ** / Consult your caregiver. 2 doses, 6 to 18 months apart.   Hepatitis B immunization.** / Check with your caregiver. 3 doses, usually over 6 months.  ** Family history and personal history of risk and conditions may change your caregiver's recommendations. Document Released: 04/16/2001 Document Revised: 10/31/2010 Document Reviewed: 07/16/2010 [facility] Patient Information 2012 [place], [place].  Insomnia Insomnia is frequent trouble falling and/or staying asleep. Insomnia can be a long term problem or a short term problem. Both are common. Insomnia can be a short term problem when the wakefulness is related to a certain stress or worry. Long term insomnia is often related to ongoing stress during  waking hours and/or poor sleeping habits. Overtime, sleep deprivation itself can make the problem worse. Every little thing feels more severe because you are overtired and your ability to cope is decreased. CAUSES   Stress, anxiety, and depression.   Poor sleeping habits.   Distractions such as TV in the bedroom.   Naps close to bedtime.   Engaging in emotionally charged conversations before bed.   Technical reading before sleep.   Alcohol and other sedatives. They may make the problem worse. They can hurt normal sleep patterns and normal dream activity.   Stimulants such as caffeine for several hours prior to bedtime.   Pain syndromes and shortness of breath can cause insomnia.   Exercise late at night.   Changing time zones may cause sleeping problems (jet lag).  It is sometimes helpful to have someone observe your sleeping patterns. They should look for periods of not breathing during the night (sleep apnea). They should also look to see how long those periods last. If you live alone or observers are uncertain, you can also be observed at a sleep clinic where your sleep patterns will be professionally monitored. Sleep apnea requires a checkup and treatment. Give your caregivers your medical history. Give your caregivers observations your family has made about your sleep.  SYMPTOMS   Not feeling rested in the morning.   Anxiety and restlessness at bedtime.   Difficulty falling and staying asleep.  TREATMENT   Your caregiver may prescribe treatment for an underlying medical disorders. Your caregiver can give advice or help if you are using alcohol or other drugs for self-medication. Treatment of underlying problems will usually eliminate insomnia problems.   Medications can be prescribed for short time use. They are generally not recommended for lengthy use.   Over-the-counter sleep medicines are not recommended for lengthy use. They can be habit forming.   You can promote  easier sleeping by making lifestyle changes such as:   Using relaxation techniques that help with breathing and reduce muscle tension.   Exercising earlier in the day.   Changing your diet and the time of your last meal. No night time snacks.   Establish a regular time to go to bed.   Counseling can help with stressful problems and worry.   Soothing music and white noise may be helpful if there are background noises you cannot remove.   Stop tedious detailed work at least one hour before bedtime.  HOME CARE INSTRUCTIONS   Keep a diary. Inform your  caregiver about your progress. This includes any medication side effects. See your caregiver regularly. Take note of:   Times when you are asleep.   Times when you are awake during the night.   The quality of your sleep.   How you feel the next day.  This information will help your caregiver care for you.  Get out of bed if you are still awake after 15 minutes. Read or do some quiet activity. Keep the lights down. Wait until you feel sleepy and go back to bed.   Keep regular sleeping and waking hours. Avoid naps.   Exercise regularly.   Avoid distractions at bedtime. Distractions include watching television or engaging in any intense or detailed activity like attempting to balance the household checkbook.   Develop a bedtime ritual. Keep a familiar routine of bathing, brushing your teeth, climbing into bed at the same time each night, listening to soothing music. Routines increase the success of falling to sleep faster.   Use relaxation techniques. This can be using breathing and muscle tension release routines. It can also include visualizing peaceful scenes. You can also help control troubling or intruding thoughts by keeping your mind occupied with boring or repetitive thoughts like the old concept of counting sheep. You can make it more creative like imagining planting one beautiful flower after another in your backyard garden.    During your day, work to eliminate stress. When this is not possible use some of the previous suggestions to help reduce the anxiety that accompanies stressful situations.  MAKE SURE YOU:   Understand these instructions.   Will watch your condition.   Will get help right away if you are not doing well or get worse.  Document Released: 02/16/2000 Document Revised: 10/31/2010 Document Reviewed: 03/18/2007 [facility] Patient Information 2012 [place], [place].

## 2011-03-14 NOTE — Progress Notes (Signed)
Subjective:     [name] is a 41 y.o. female and is here for a comprehensive physical exam. The patient reports no problems.  History   Social History  . Marital Status: Divorced    Spouse Name: N/A    Number of Children: N/A  . Years of Education: N/A   Occupational History  . Not on file.   Social History Main Topics  . Smoking status: Never Smoker   . Smokeless tobacco: Never Used  . Alcohol Use: No  . Drug Use: No  . Sexually Active: Yes -- Female partner(s)    Birth Control/ Protection: None   Other Topics Concern  . Not on file   Social History Narrative   Exercising---treadmill 4x a week   Health Maintenance  Topic Date Due  . Tetanus/tdap  11/02/1990  . Influenza Vaccine  12/03/2011  . Pap Smear  12/11/2013    The following portions of the patient's history were reviewed and updated as appropriate: allergies, current medications, past family history, past medical history, past social history, past surgical history and problem list.  Review of Systems Review of Systems  Constitutional: Negative for activity change, appetite change and fatigue.  HENT: Negative for hearing loss, congestion, tinnitus and ear discharge.  dentist q64m Eyes: Negative for visual disturbance (see optho q1y -- vision corrected to 20/20 with glasses).  Respiratory: Negative for cough, chest tightness and shortness of breath.   Cardiovascular: Negative for chest pain, palpitations and leg swelling.  Gastrointestinal: Negative for abdominal pain, diarrhea, constipation and abdominal distention.  Genitourinary: Negative for urgency, frequency, decreased urine volume and difficulty urinating.  Musculoskeletal: Negative for back pain, arthralgias and gait problem.  Skin: Negative for color change, pallor and rash.  Neurological: Negative for dizziness, light-headedness, numbness and headaches.  Hematological: Negative for adenopathy. Does not bruise/bleed easily.    Psychiatric/Behavioral: Negative for suicidal ideas, confusion, sleep disturbance, self-injury, dysphoric mood, decreased concentration and agitation.       Objective:    BP 120/80  Pulse 76  Temp(Src) 98.1 F (36.7 C) (Oral)  Ht 5\' 5"  (1.651 m)  Wt 187 lb 3.2 oz (84.913 kg)  BMI 31.15 kg/m2  SpO2 98%  General Appearance:    Alert, cooperative, no distress, appears stated age  Head:    Normocephalic, without obvious abnormality, atraumatic  Eyes:    PERRL, conjunctiva/corneas clear, EOM's intact, fundi    benign, both eyes  Ears:    Normal TM's and external ear canals, both ears  Nose:   Nares normal, septum midline, mucosa normal, no drainage    or sinus tenderness  Throat:   Lips, mucosa, and tongue normal; teeth and gums normal  Neck:   Supple, symmetrical, trachea midline, no adenopathy;    thyroid:  no enlargement/tenderness/nodules; no carotid   bruit or JVD  Back:     Symmetric, no curvature, ROM normal, no CVA tenderness  Lungs:     Clear to auscultation bilaterally, respirations unlabored  Chest Wall:    No tenderness or deformity   Heart:    Regular rate and rhythm, S1 and S2 normal, no murmur, rub   or gallop  Breast Exam:   gyn  Abdomen:     Soft, non-tender, bowel sounds active all four quadrants,    no masses, no organomegaly  Genitalia:   gyn  Rectal:  gyn  Extremities:   Extremities normal, atraumatic, no cyanosis or edema  Pulses:   2+ and symmetric all extremities  Skin:   Skin color, texture, turgor normal, no rashes or lesions  Lymph nodes:   Cervical, supraclavicular, and axillary nodes normal  Neurologic:   CNII-XII intact, normal strength, sensation and reflexes    throughout     Assessment:    Healthy female exam.  HTN--stable , con't meds Hyperlipidemia-- con't meds, check labs   Plan:  Ghm utd con't meds Check labs   See After Visit Summary for Counseling Recommendations

## 2011-04-22 ENCOUNTER — Ambulatory Visit (INDEPENDENT_AMBULATORY_CARE_PROVIDER_SITE_OTHER): Payer: Managed Care, Other (non HMO) | Admitting: Family Medicine

## 2011-04-22 ENCOUNTER — Ambulatory Visit (HOSPITAL_BASED_OUTPATIENT_CLINIC_OR_DEPARTMENT_OTHER)
Admission: RE | Admit: 2011-04-22 | Discharge: 2011-04-22 | Disposition: A | Discharge status: Home / Self Care | Payer: Managed Care, Other (non HMO) | Source: Ambulatory Visit | Attending: Family Medicine | Admitting: Family Medicine

## 2011-04-22 ENCOUNTER — Encounter: Payer: Self-pay | Admitting: Family Medicine

## 2011-04-22 VITALS — BP 120/74 | HR 78 | Temp 98.8°F | Wt 191.4 lb

## 2011-04-22 DIAGNOSIS — R05 Cough: Secondary | ICD-10-CM

## 2011-04-22 DIAGNOSIS — J40 Bronchitis, not specified as acute or chronic: Secondary | ICD-10-CM

## 2011-04-22 DIAGNOSIS — R110 Nausea: Secondary | ICD-10-CM

## 2011-04-22 DIAGNOSIS — R059 Cough, unspecified: Secondary | ICD-10-CM | POA: Insufficient documentation

## 2011-04-22 MED ORDER — PROMETHAZINE HCL 25 MG PO TABS: ORAL_TABLET | ORAL | Status: DC

## 2011-04-22 MED ORDER — BECLOMETHASONE DIPROPIONATE 40 MCG/ACT IN AERS: 2.0000 | INHALATION_SPRAY | Freq: Two times a day (BID) | RESPIRATORY_TRACT | Status: DC

## 2011-04-22 MED ORDER — AZITHROMYCIN 250 MG PO TABS: ORAL_TABLET | ORAL | Status: AC

## 2011-04-22 NOTE — Progress Notes (Signed)
  Subjective:     [name] is a 41 y.o. female here for evaluation of a cough. Onset of symptoms was 2 months ago. Symptoms have been gradually worsening since that time. The cough is productive and is aggravated by  talking ,  laying down---anything can trigger cough.. Associated symptoms include: none. Patient does not have a history of asthma. Patient does have a history of environmental allergens. Patient has not traveled recently. Patient does not have a history of smoking. Patient has not had a previous chest x-ray. Patient has not had a PPD done.  The following portions of the patient's history were reviewed and updated as appropriate: allergies, current medications, past family history, past medical history, past social history, past surgical history and problem list.  Review of Systems Pertinent items are noted in HPI.    Objective:    Oxygen saturation 97% on room air BP 120/74  Pulse 78  Temp(Src) 98.8 F (37.1 C) (Oral)  Wt 191 lb 6.4 oz (86.818 kg)  SpO2 97% General appearance: alert, cooperative, appears stated age and no distress Ears: normal TM's and external ear canals both ears Nose: Nares normal. Septum midline. Mucosa normal. No drainage or sinus tenderness. Throat: abnormal findings: exudates present and mild oropharyngeal erythema Neck: moderate anterior cervical adenopathy, supple, symmetrical, trachea midline and thyroid not enlarged, symmetric, no tenderness/mass/nodules Lungs: diminished breath sounds bilaterally Heart: S1, S2 normal    Assessment:    Acute Bronchitis    Plan:    Antibiotics per medication orders. Antitussives per medication orders. Avoid exposure to tobacco smoke and fumes. Call if shortness of breath worsens, blood in sputum, change in character of cough, development of fever or chills, inability to maintain nutrition and hydration. Avoid exposure to tobacco smoke and fumes. Chest x-ray.

## 2011-04-22 NOTE — Patient Instructions (Signed)

## 2011-04-23 ENCOUNTER — Other Ambulatory Visit: Payer: Self-pay | Admitting: *Deleted

## 2011-04-23 MED ORDER — GUAIFENESIN-CODEINE 100-10 MG/5ML PO SYRP: 5.0000 mL | ORAL_SOLUTION | Freq: Three times a day (TID) | ORAL | Status: DC | PRN

## 2011-04-23 NOTE — Telephone Encounter (Signed)
Last OV 04/22/11

## 2011-04-23 NOTE — Telephone Encounter (Signed)
Patient stated she was told to double the medication and since she had been off today she had been taking 2 tsps and so she is getting low and would like a refill, she stated it is working and the cough is getting better. Please advise    KP

## 2011-04-23 NOTE — Telephone Encounter (Signed)
Did 2 tsp not work?  She had a 1/2 a bottle with her---I told her to call if it did n't work and we would call something else in.   She is only taking 2 tsp at night it should last several days.

## 2011-05-02 ENCOUNTER — Encounter (INDEPENDENT_AMBULATORY_CARE_PROVIDER_SITE_OTHER): Payer: Self-pay

## 2011-05-02 ENCOUNTER — Ambulatory Visit (INDEPENDENT_AMBULATORY_CARE_PROVIDER_SITE_OTHER): Payer: Managed Care, Other (non HMO) | Admitting: Physician Assistant

## 2011-05-02 VITALS — BP 122/76 | HR 68 | Temp 97.9°F | Resp 18 | Ht 66.0 in | Wt 195.6 lb

## 2011-05-02 DIAGNOSIS — Z4651 Encounter for fitting and adjustment of gastric lap band: Secondary | ICD-10-CM

## 2011-05-02 NOTE — Patient Instructions (Signed)
Take clear liquids tonight. Thin protein shakes are ok to start tomorrow morning. Slowly advance your diet thereafter. Call us if you have persistent vomiting or regurgitation, night cough or reflux symptoms. Return as scheduled or sooner if you notice no changes in hunger/portion sizes.  

## 2011-05-02 NOTE — Progress Notes (Signed)
  HISTORY: [name] is a 41 y.o.female who received an AP-Standard lap-band in August 2009 by Dr. Daphine Deutscher. She comes in today complaining of persistent, significant hunger despite her last adjustment. She is also gaining weight. She has GERD symptoms about 2 nights a week, which is better than previously but is still concerning. Upon further evaluation, she tends to eat close to bedtime, sometimes within one hour.  VITAL SIGNS: Filed Vitals:   05/02/11 1107  BP: 122/76  Pulse: 68  Temp: 97.9 F (36.6 C)  Resp: 18    PHYSICAL EXAM: Physical exam reveals a very well-appearing 41 y.o.female in no apparent distress Neurologic: Awake, alert, oriented Psych: Bright affect, conversant Respiratory: Breathing even and unlabored. No stridor or wheezing Abdomen: Soft, nontender, nondistended to palpation. Incisions well-healed. No incisional hernias. Port easily palpated. Extremities: Atraumatic, good range of motion.  ASSESMENT: 41 y.o.  female  s/p AP-Standard lap-band.   PLAN: The patient's port was accessed with a 20G Huber needle without difficulty. 3.5 mL of Clear fluid was aspirated, which is 1.25 mL less than predicted. 1 mL saline was added to the port to give a total predicted volume of 4.5 mL. The patient was able to swallow water without difficulty following the procedure and was instructed to take clear liquids for the next 24-48 hours and advance slowly as tolerated. I'll endeavor to check her total volume on her next visit to rule out leak.

## 2011-05-06 ENCOUNTER — Ambulatory Visit (INDEPENDENT_AMBULATORY_CARE_PROVIDER_SITE_OTHER): Payer: Managed Care, Other (non HMO) | Admitting: General Surgery

## 2011-05-06 ENCOUNTER — Encounter (INDEPENDENT_AMBULATORY_CARE_PROVIDER_SITE_OTHER): Payer: Self-pay | Admitting: General Surgery

## 2011-05-06 VITALS — BP 132/84 | HR 80 | Temp 97.9°F | Resp 18 | Ht 66.0 in | Wt 188.6 lb

## 2011-05-06 DIAGNOSIS — Z4651 Encounter for fitting and adjustment of gastric lap band: Secondary | ICD-10-CM

## 2011-05-06 NOTE — Progress Notes (Signed)
Subjective:     Patient ID: [name], female   DOB: 07/20/1970, 41 y.o.   MRN: [id number]  HPI This is a 41 year old female who underwent a laparoscopic gastric band. She was filled last Thursday to a total fill volume of 4.5 cc. Since then she has been unable to keep any food or liquids down. She has had mesis and retching since then. She comes in today to be evaluated.  Review of Systems     Objective:   Physical Exam Well healed incisions, abdomen soft, nontender    Assessment:     S/p lagb, fill too tight unable to tolerated liquids    Plan:        I told her due to the fact she been for 5 days with nausea and vomiting with retching that he wishes removal of her fluid at this point. I then cleansed the area and evacuated 4.5 cc of fluid. A Band-Aid was applied. She was able to tolerate liquids and felt better immediately. She is going to be contacted by Dr. Ermalene Searing nurse for followup appointment.

## 2011-05-06 NOTE — Patient Instructions (Addendum)
Should see Dr. Daphine Deutscher in next week or two.  Stay on clear liquids until tomorrow and then advance diet.

## 2011-05-30 ENCOUNTER — Encounter (INDEPENDENT_AMBULATORY_CARE_PROVIDER_SITE_OTHER): Payer: Self-pay | Admitting: Surgery

## 2011-05-30 ENCOUNTER — Ambulatory Visit (INDEPENDENT_AMBULATORY_CARE_PROVIDER_SITE_OTHER): Payer: Managed Care, Other (non HMO) | Admitting: Surgery

## 2011-05-30 VITALS — BP 112/78 | HR 64 | Temp 97.8°F | Ht 66.0 in | Wt 198.4 lb

## 2011-05-30 DIAGNOSIS — Z9884 Bariatric surgery status: Secondary | ICD-10-CM

## 2011-05-30 NOTE — Progress Notes (Signed)
[name] Body mass index is 32.02 kg/(m^2).  Having regurgitation:  no  Nocturnal reflux?  no  Amount of fill  3.9 At the time of her last fill by hand he on 228 when she went up from 3-1/2-4.5 she was over restrictive. She saw [facility] fill in 05/06/11  and he took out 4.5 cc from her band. Today I added 3.9 cc to her band. She'll see [place] back in about 6 weeks. She's been a green zone. She has lost over 100 pounds all for preop weight and looks great. She is real success and I applauded her for her ongoing success.

## 2011-06-03 ENCOUNTER — Telehealth (INDEPENDENT_AMBULATORY_CARE_PROVIDER_SITE_OTHER): Payer: Self-pay

## 2011-06-03 NOTE — Telephone Encounter (Signed)
Pt states she had recent fill with Dr Daphine Deutscher and is now having difficulty with reflux. Pt requests Dr Daphine Deutscher remove some fluid from band. Please review schedule and see when pt can come in to have adjustment. Pt can be reached at [phone].

## 2011-06-06 ENCOUNTER — Encounter (INDEPENDENT_AMBULATORY_CARE_PROVIDER_SITE_OTHER): Payer: Managed Care, Other (non HMO)

## 2011-06-10 ENCOUNTER — Telehealth (INDEPENDENT_AMBULATORY_CARE_PROVIDER_SITE_OTHER): Payer: Self-pay | Admitting: General Surgery

## 2011-06-10 NOTE — Telephone Encounter (Signed)
Pt calling about reflux.  She recently had all the fill removed from her lap band (Dr. Dwain Sarna) and all the reflux resolved.  Dr. Daphine Deutscher replaced the fill on 3/28, and the reflux has returned as bad as before.  He reports only occasional vomiting, but nothing regular or on-going.  She has appt on 06/27/11 with [facility], but wants to see Dr. Daphine Deutscher to address possible slippage of the band.  Next available appt not until June. Can she be worked in sooner.  Please advise.

## 2011-06-13 ENCOUNTER — Ambulatory Visit (INDEPENDENT_AMBULATORY_CARE_PROVIDER_SITE_OTHER): Payer: Managed Care, Other (non HMO) | Admitting: Physician Assistant

## 2011-06-13 ENCOUNTER — Ambulatory Visit (HOSPITAL_COMMUNITY)
Admission: RE | Admit: 2011-06-13 | Discharge: 2011-06-13 | Disposition: A | Discharge status: Home / Self Care | Payer: Managed Care, Other (non HMO) | Source: Ambulatory Visit | Attending: Physician Assistant | Admitting: Physician Assistant

## 2011-06-13 ENCOUNTER — Encounter (INDEPENDENT_AMBULATORY_CARE_PROVIDER_SITE_OTHER): Payer: Self-pay

## 2011-06-13 VITALS — BP 122/76 | HR 68 | Temp 97.6°F | Resp 18 | Ht 66.0 in | Wt 190.5 lb

## 2011-06-13 DIAGNOSIS — K3189 Other diseases of stomach and duodenum: Secondary | ICD-10-CM | POA: Insufficient documentation

## 2011-06-13 DIAGNOSIS — Z4651 Encounter for fitting and adjustment of gastric lap band: Secondary | ICD-10-CM

## 2011-06-13 DIAGNOSIS — K5900 Constipation, unspecified: Secondary | ICD-10-CM | POA: Insufficient documentation

## 2011-06-13 DIAGNOSIS — R1013 Epigastric pain: Secondary | ICD-10-CM | POA: Insufficient documentation

## 2011-06-13 DIAGNOSIS — K219 Gastro-esophageal reflux disease without esophagitis: Secondary | ICD-10-CM | POA: Insufficient documentation

## 2011-06-13 DIAGNOSIS — Z9884 Bariatric surgery status: Secondary | ICD-10-CM | POA: Insufficient documentation

## 2011-06-13 NOTE — Telephone Encounter (Signed)
The patient contacted the office in need of fluid to be removed states she is having tremendous amounts of reflux, the patient states that she felt better with her fluid removed. Asked her to come to the clinic and we will work her in to be seen this am

## 2011-06-13 NOTE — Progress Notes (Signed)
  HISTORY: [name] is a 41 y.o.female who received an AP-Standard lap-band in August 2009 by Dr. Daphine Deutscher. She comes in having last been seen two weeks ago and is having recurrent reflux, mostly at night. She has had this twice in the past with removal of fluid, which has relieved the symptoms but we seem to have difficulty finding the right fill volume to keep these from occuring.  VITAL SIGNS: Filed Vitals:   06/13/11 0944  BP: 122/76  Pulse: 68  Temp: 97.6 F (36.4 C)  Resp: 18    PHYSICAL EXAM: Physical exam reveals a very well-appearing 41 y.o.female in no apparent distress Neurologic: Awake, alert, oriented Psych: Bright affect, conversant Respiratory: Breathing even and unlabored. No stridor or wheezing Abdomen: Soft, nontender, nondistended to palpation. Incisions well-healed. No incisional hernias. Port easily palpated. Extremities: Atraumatic, good range of motion.  ASSESMENT: 41 y.o.  female  s/p AP-Standard lap-band.   PLAN: The patient's port was accessed with a 20G Huber needle without difficulty. Clear fluid was aspirated and 0.5 mL saline was removed from the port to give a total predicted volume of 3.4 mL. I asked her to begin OTC prilosec daily and to get an upper GI. I'll call her with the results. We'll have her back in 2 months if everything looks ok.

## 2011-06-13 NOTE — Patient Instructions (Signed)
Attend your radiologic study as scheduled. Return to see [place] in 2 months or sooner if necessary.

## 2011-06-27 ENCOUNTER — Encounter (INDEPENDENT_AMBULATORY_CARE_PROVIDER_SITE_OTHER): Payer: Managed Care, Other (non HMO)

## 2011-08-15 ENCOUNTER — Encounter (INDEPENDENT_AMBULATORY_CARE_PROVIDER_SITE_OTHER): Payer: Self-pay

## 2011-08-15 ENCOUNTER — Ambulatory Visit (INDEPENDENT_AMBULATORY_CARE_PROVIDER_SITE_OTHER): Payer: Managed Care, Other (non HMO) | Admitting: Physician Assistant

## 2011-08-15 VITALS — BP 132/90 | HR 76 | Temp 97.1°F | Resp 16 | Ht 66.0 in | Wt 201.0 lb

## 2011-08-15 DIAGNOSIS — Z4651 Encounter for fitting and adjustment of gastric lap band: Secondary | ICD-10-CM

## 2011-08-15 NOTE — Progress Notes (Signed)
  HISTORY: [name] is a 41 y.o.female who received an AP-Standard lap-band in August 2009 by Dr. Daphine Deutscher. She comes in two months after having fluid removed for reflux. She says the reflux has resolved but her hunger and larger portion sizes have returned.  VITAL SIGNS: Filed Vitals:   08/15/11 0908  BP: 132/90  Pulse: 76  Temp: 97.1 F (36.2 C)  Resp: 16    PHYSICAL EXAM: Physical exam reveals a very well-appearing 41 y.o.female in no apparent distress Neurologic: Awake, alert, oriented Psych: Bright affect, conversant Respiratory: Breathing even and unlabored. No stridor or wheezing Abdomen: Soft, nontender, nondistended to palpation. Incisions well-healed. No incisional hernias. Port easily palpated. Extremities: Atraumatic, good range of motion.  ASSESMENT: 41 y.o.  female  s/p AP-Standard lap-band.   PLAN: The patient's port was accessed with a 20G Huber needle without difficulty. Clear fluid was aspirated and 0.25 mL saline was added to the port to give a total predicted volume of 3.65 mL. The patient was able to swallow water without difficulty following the procedure and was instructed to take clear liquids for the next 24-48 hours and advance slowly as tolerated.

## 2011-08-15 NOTE — Patient Instructions (Signed)
Take clear liquids tonight. Thin protein shakes are ok to start tomorrow morning. Slowly advance your diet thereafter. Call us if you have persistent vomiting or regurgitation, night cough or reflux symptoms. Return as scheduled or sooner if you notice no changes in hunger/portion sizes.  

## 2011-08-29 ENCOUNTER — Encounter (INDEPENDENT_AMBULATORY_CARE_PROVIDER_SITE_OTHER): Payer: Managed Care, Other (non HMO)

## 2011-10-16 ENCOUNTER — Ambulatory Visit (INDEPENDENT_AMBULATORY_CARE_PROVIDER_SITE_OTHER): Payer: Managed Care, Other (non HMO) | Admitting: Family Medicine

## 2011-10-16 ENCOUNTER — Encounter: Payer: Self-pay | Admitting: Family Medicine

## 2011-10-16 VITALS — BP 124/88 | HR 70 | Temp 98.5°F | Wt 203.6 lb

## 2011-10-16 DIAGNOSIS — R110 Nausea: Secondary | ICD-10-CM

## 2011-10-16 DIAGNOSIS — N390 Urinary tract infection, site not specified: Secondary | ICD-10-CM

## 2011-10-16 DIAGNOSIS — N898 Other specified noninflammatory disorders of vagina: Secondary | ICD-10-CM

## 2011-10-16 LAB — POCT URINALYSIS DIPSTICK
Bilirubin, UA: NEGATIVE
Ketones, UA: NEGATIVE
Protein, UA: NEGATIVE
Spec Grav, UA: <=1.005
pH, UA: 6.5

## 2011-10-16 MED ORDER — CIPROFLOXACIN HCL 500 MG PO TABS: 500.0000 mg | ORAL_TABLET | Freq: Two times a day (BID) | ORAL | Status: AC

## 2011-10-16 MED ORDER — PROMETHAZINE HCL 25 MG PO TABS: ORAL_TABLET | ORAL | Status: DC

## 2011-10-16 NOTE — Progress Notes (Signed)
  Subjective:    [name] is a 41 y.o. female who complains of burning with urination, dysuria, frequency, pain in the lower abdomen and suprapubic pressure. She has had symptoms for 1 day. Patient also complains of stomach ache and vaginal discharge. Patient denies back pain, congestion, cough, fever, headache, rhinitis and sorethroat. Patient does have a history of recurrent UTI. Patient does not have a history of pyelonephritis.   The following portions of the patient's history were reviewed and updated as appropriate: allergies, current medications, past family history, past medical history, past social history, past surgical history and problem list.  Review of Systems Pertinent items are noted in HPI.    Objective:    BP 124/88  Pulse 70  Temp 98.5 F (36.9 C) (Oral)  Wt 203 lb 9.6 oz (92.352 kg)  SpO2 98% General appearance: alert, cooperative, appears stated age and no distress Abdomen: soft, non-tender; bowel sounds normal; no masses,  no organomegaly Pelvic: no ext lesions ,   + scant amount white d/c ,no odor,    Laboratory:  Urine dipstick: lg leuk,  + blood  Micro exam: not done   Assessment:    dysuria with vaginal d/c   Plan:    wet mount done and urine culture pending cipro 500 mg bid  Call or rto prn

## 2011-10-16 NOTE — Patient Instructions (Signed)

## 2011-10-17 ENCOUNTER — Other Ambulatory Visit: Payer: Self-pay | Admitting: Family Medicine

## 2011-10-17 ENCOUNTER — Encounter: Payer: Self-pay | Admitting: Family Medicine

## 2011-10-17 LAB — WET PREP BY MOLECULAR PROBE
Candida species: POSITIVE — AB
Gardnerella vaginalis: POSITIVE — AB
Trichomonas vaginosis: NEGATIVE

## 2011-10-17 MED ORDER — FLUCONAZOLE 150 MG PO TABS: ORAL_TABLET | ORAL | Status: DC

## 2011-10-17 MED ORDER — METRONIDAZOLE 0.75 % VA GEL: VAGINAL | Status: DC

## 2011-10-18 ENCOUNTER — Other Ambulatory Visit: Payer: Self-pay | Admitting: *Deleted

## 2011-10-18 NOTE — Telephone Encounter (Signed)
Left message to call office

## 2011-10-18 NOTE — Telephone Encounter (Signed)
No other oral med for yeast.  Can use OTC monistat

## 2011-10-18 NOTE — Telephone Encounter (Signed)
Message copied by Verdene Rio on Fri Oct 18, 2011 10:48 AM ------      Message from: Almeta Monas P      Created: Thu Oct 17, 2011 11:49 AM                   ----- Message -----         From: Lelon Perla, DO         Sent: 10/17/2011   8:50 AM           To: Nada Maclachlan, MA            + yeast-- i sent diflucan to pharmacy but she can use otc monistat etc if she is already gone      + BV  metrogel ---  Its on her med list so she may have it already

## 2011-10-18 NOTE — Telephone Encounter (Signed)
Pt states that she is allergy to the diflucan and would like to know if there is any other oral med that she can take for the yeast. .Please advise

## 2011-10-24 ENCOUNTER — Ambulatory Visit (INDEPENDENT_AMBULATORY_CARE_PROVIDER_SITE_OTHER): Payer: Managed Care, Other (non HMO) | Admitting: Physician Assistant

## 2011-10-24 ENCOUNTER — Encounter (INDEPENDENT_AMBULATORY_CARE_PROVIDER_SITE_OTHER): Payer: Self-pay

## 2011-10-24 VITALS — BP 118/76 | Ht 66.0 in | Wt 203.4 lb

## 2011-10-24 DIAGNOSIS — Z4651 Encounter for fitting and adjustment of gastric lap band: Secondary | ICD-10-CM

## 2011-10-24 NOTE — Patient Instructions (Signed)
Take clear liquids tonight. Thin protein shakes are ok to start tomorrow morning. Slowly advance your diet thereafter. Call us if you have persistent vomiting or regurgitation, night cough or reflux symptoms. Return as scheduled or sooner if you notice no changes in hunger/portion sizes.  

## 2011-10-24 NOTE — Progress Notes (Signed)
  HISTORY: [name] is a 41 y.o.female who received an AP-Standard lap-band in August 2009 by Dr. Daphine Deutscher. She comes in with slight weight gain since her last visit but no further regurgitation or reflux. She says her appetite is significant and she's eating larger portions as well. Her last fill did help somewhat but she's still trending in the wrong direction.  VITAL SIGNS: Filed Vitals:   10/24/11 1040  BP: 118/76    PHYSICAL EXAM: Physical exam reveals a very well-appearing 41 y.o.female in no apparent distress Neurologic: Awake, alert, oriented Psych: Bright affect, conversant Respiratory: Breathing even and unlabored. No stridor or wheezing Abdomen: Soft, nontender, nondistended to palpation. Incisions well-healed. No incisional hernias. Port easily palpated. Extremities: Atraumatic, good range of motion.  ASSESMENT: 41 y.o.  female  s/p AP-Standard lap-band.   PLAN: The patient's port was accessed with a 20G Huber needle without difficulty. Clear fluid was aspirated and 0.25 mL saline was added to the port to give a total predicted volume of 3.9 mL. The patient was able to swallow water without difficulty following the procedure and was instructed to take clear liquids for the next 24-48 hours and advance slowly as tolerated.

## 2011-11-07 ENCOUNTER — Encounter (INDEPENDENT_AMBULATORY_CARE_PROVIDER_SITE_OTHER): Payer: Managed Care, Other (non HMO)

## 2011-11-07 NOTE — Telephone Encounter (Signed)
Left message to call office

## 2011-11-11 ENCOUNTER — Encounter: Payer: Self-pay | Admitting: Family Medicine

## 2011-11-11 ENCOUNTER — Ambulatory Visit (INDEPENDENT_AMBULATORY_CARE_PROVIDER_SITE_OTHER): Payer: Managed Care, Other (non HMO) | Admitting: Family Medicine

## 2011-11-11 VITALS — BP 118/80 | HR 100 | Temp 98.4°F | Wt 200.4 lb

## 2011-11-11 DIAGNOSIS — K219 Gastro-esophageal reflux disease without esophagitis: Secondary | ICD-10-CM

## 2011-11-11 LAB — H. PYLORI ANTIBODY, IGG: H Pylori IgG: NEGATIVE

## 2011-11-11 LAB — CBC WITH DIFFERENTIAL/PLATELET
Basophils Relative: 0.3 % (ref 0.0–3.0)
Eosinophils Relative: 2.3 % (ref 0.0–5.0)
HCT: 37.5 % (ref 36.0–46.0)
Lymphs Abs: 1.4 10*3/uL (ref 0.7–4.0)
MCV: 89 fl (ref 78.0–100.0)
Monocytes Absolute: 0.4 10*3/uL (ref 0.1–1.0)
Monocytes Relative: 5.5 % (ref 3.0–12.0)
Neutrophils Relative %: 71.3 % (ref 43.0–77.0)
RBC: 4.21 Mil/uL (ref 3.87–5.11)
WBC: 7 10*3/uL (ref 4.5–10.5)

## 2011-11-11 LAB — HEPATIC FUNCTION PANEL
ALT: 17 U/L (ref 0–35)
AST: 17 U/L (ref 0–37)
Alkaline Phosphatase: 60 U/L (ref 39–117)
Total Bilirubin: 0.7 mg/dL (ref 0.3–1.2)

## 2011-11-11 LAB — BASIC METABOLIC PANEL
Chloride: 104 mEq/L (ref 96–112)
GFR: 86.94 mL/min (ref >60.00)
Potassium: 3.4 mEq/L — ABNORMAL LOW (ref 3.5–5.1)

## 2011-11-11 MED ORDER — ESOMEPRAZOLE MAGNESIUM 40 MG PO CPDR: 40.0000 mg | DELAYED_RELEASE_CAPSULE | Freq: Every day | ORAL | Status: DC

## 2011-11-11 MED ORDER — PANTOPRAZOLE SODIUM 40 MG PO TBEC: 40.0000 mg | DELAYED_RELEASE_TABLET | Freq: Every day | ORAL | Status: DC

## 2011-11-11 NOTE — Progress Notes (Signed)
  Subjective:     [name] is an 41 y.o. female who presents for evaluation of heartburn. This has been associated with belching, cough, heartburn and coughing up undigested food. She denies belching, belching and eructation, bilious reflux, chest pain and cough. Symptoms have been present for 4 days. She denies dysphagia. She has lap band and it trying to lose weight.  . She denies melena, hematochezia, hematemesis, and coffee ground emesis. Medical therapy in the past has included: antacids, H2 antagonists and proton pump inhibitors.  The following portions of the patient's history were reviewed and updated as appropriate: allergies, current medications, past family history, past medical history, past social history, past surgical history and problem list.  Review of Systems Pertinent items are noted in HPI.   Objective:     BP 118/80  Pulse 100  Temp 98.4 F (36.9 C) (Oral)  Wt 200 lb 6.4 oz (90.901 kg)  SpO2 97% General appearance: alert, cooperative, appears stated age and no distress Abdomen: soft, non-tender; bowel sounds normal; no masses,  no organomegaly   Assessment:    Gastroesophageal Reflux Disease,      Plan:    Will start a trial of proton pump inhibitors. Follow up in 3 weeks or sooner as needed.  Check labs---f/u surgeon

## 2011-11-11 NOTE — Patient Instructions (Signed)
Diet for GERD or PUD Nutrition therapy can help ease the discomfort of gastroesophageal reflux disease (GERD) and peptic ulcer disease (PUD).  HOME CARE INSTRUCTIONS   Eat your meals slowly, in a relaxed setting.   Eat 5 to 6 small meals per day.   If a food causes distress, stop eating it for a period of time.  FOODS TO AVOID  Coffee, regular or decaffeinated.   Cola beverages, regular or low calorie.   Tea, regular or decaffeinated.   Pepper.   Cocoa.   High fat foods, including meats.   Butter, margarine, hydrogenated oil (trans fats).   Peppermint or spearmint (if you have GERD).   Fruits and vegetables if not tolerated.   Alcohol.   Nicotine (smoking or chewing). This is one of the most potent stimulants to acid production in the gastrointestinal tract.   Any food that seems to aggravate your condition.  If you have questions regarding your diet, ask your caregiver or a registered dietitian. TIPS  Lying flat may make symptoms worse. Keep the head of your bed raised 6 to 9 inches (15 to 23 cm) by using a foam wedge or blocks under the legs of the bed.   Do not lay down until 3 hours after eating a meal.   Daily physical activity may help reduce symptoms.  MAKE SURE YOU:   Understand these instructions.   Will watch your condition.   Will get help right away if you are not doing well or get worse.  Document Released: 02/18/2005 Document Revised: 02/07/2011 Document Reviewed: 01/04/2011 [employer] Patient Information 2012 [employer]. 

## 2011-11-20 ENCOUNTER — Telehealth: Payer: Self-pay | Admitting: Family Medicine

## 2011-11-20 NOTE — Telephone Encounter (Signed)
Caller: [name]; Patient Name: [name]; PCP: Lelon Perla.; Best Callback Phone Number: [phone]; Reason for call: Shortness of Breath.  Onset 11/18/11. Pt unsure if she is havng panic attacks but pt feels like she can't take a deep breath.  Hard for pt to describe. All emergent symptoms ruled out per Breathing Problems protocol with exception to 'New or worsening breathing problems not responding to treatment or no treatment plan.'  See Provider in 4 hrs.  Home care advice given.  PLEASE FOLLOW UP WITH PT IN REGARD TO APPT.  Thank you.

## 2011-11-20 NOTE — Telephone Encounter (Signed)
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 I have schedule her and apt for tomorrow with Dr.Lowne and she has been made aware and agreed to go to the ED for an evaluation if she develops any of the above symptoms. Will forward to MD and Dr.Tabori for review as well.

## 2011-11-20 NOTE — Telephone Encounter (Signed)
Noted.  Agree w/ advice given 

## 2011-11-21 ENCOUNTER — Encounter: Payer: Self-pay | Admitting: Family Medicine

## 2011-11-21 ENCOUNTER — Ambulatory Visit (INDEPENDENT_AMBULATORY_CARE_PROVIDER_SITE_OTHER): Payer: Managed Care, Other (non HMO) | Admitting: Family Medicine

## 2011-11-21 VITALS — BP 120/78 | HR 81 | Temp 98.5°F | Wt 200.8 lb

## 2011-11-21 DIAGNOSIS — F419 Anxiety disorder, unspecified: Secondary | ICD-10-CM

## 2011-11-21 DIAGNOSIS — Z0000 Encounter for general adult medical examination without abnormal findings: Secondary | ICD-10-CM

## 2011-11-21 DIAGNOSIS — F411 Generalized anxiety disorder: Secondary | ICD-10-CM

## 2011-11-21 MED ORDER — ALPRAZOLAM 0.25 MG PO TABS: ORAL_TABLET | ORAL | Status: DC

## 2011-11-21 MED ORDER — CITALOPRAM HYDROBROMIDE 10 MG PO TABS
10.0000 mg | ORAL_TABLET | Freq: Every day | ORAL | Status: DC
Start: 2011-11-21 — End: 2012-06-25

## 2011-11-21 NOTE — Progress Notes (Signed)
  Subjective:     [name] is a 41 y.o. female who presents for new evaluation and treatment of anxiety disorder and panic attacks. She has the following anxiety symptoms: difficulty concentrating, fatigue, insomnia and shortness of breath. Onset of symptoms was approximately 2 weeks ago. Symptoms have been gradually worsening since that time. She denies current suicidal and homicidal ideation. Family history significant for no psychiatric illness. 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 Previous treatment includes none. She complains of the following medication side effects: none. The following portions of the patient's history were reviewed and updated as appropriate: allergies, current medications, past family history, past medical history, past social history, past surgical history and problem list.  Review of Systems Pertinent items are noted in HPI.    Objective:    BP 120/78  Pulse 81  Temp 98.5 F (36.9 C) (Oral)  Wt 200 lb 12.8 oz (91.082 kg)  SpO2 98% General appearance: alert, cooperative, appears stated age and no distress Lungs: clear to auscultation bilaterally Heart: S1, S2 normal Neurologic: Alert and oriented X 3, normal strength and tone. Normal symmetric reflexes. Normal coordination and gait psych--anxious , can't catch her breath    Assessment:    anxiety disorder. Possible organic contributing causes are: stress, school.   Plan:    Medications: Celexa and Xanax. Labs: titres checked for nursing school. Handouts describing disease, natural history, and treatment were given to the patient. Reviewed concept of anxiety as biochemical imbalance of neurotransmitters and rationale for treatment. Instructed patient to contact office or on-call physician promptly should condition worsen or any new symptoms appear and provided on-call telephone numbers. IF THE PATIENT HAS ANY SUICIDAL OR HOMICIDAL IDEATIONS, CALL THE OFFICE, DISCUSS WITH A SUPPORT MEMBER,  OR GO TO THE ER IMMEDIATELY. Patient was agreeable with this plan. Follow up: 1 month.

## 2011-11-21 NOTE — Patient Instructions (Addendum)

## 2011-11-22 LAB — VARICELLA ZOSTER ANTIBODY, IGG: Varicella IgG: 2.96 {ISR} — ABNORMAL HIGH (ref <0.90)

## 2011-11-22 LAB — MEASLES/MUMPS/RUBELLA IMMUNITY: Rubeola IgG: 1.32 {ISR} — ABNORMAL HIGH

## 2011-12-03 ENCOUNTER — Other Ambulatory Visit: Payer: Self-pay | Admitting: Family Medicine

## 2011-12-03 ENCOUNTER — Telehealth: Payer: Self-pay

## 2011-12-03 NOTE — Telephone Encounter (Signed)
Spoke to pt concerning labs.Pt stated received copy for school. Pt stated understanding.      MW

## 2012-01-16 ENCOUNTER — Encounter (INDEPENDENT_AMBULATORY_CARE_PROVIDER_SITE_OTHER): Payer: Managed Care, Other (non HMO)

## 2012-01-28 ENCOUNTER — Other Ambulatory Visit: Payer: Self-pay

## 2012-01-28 DIAGNOSIS — K219 Gastro-esophageal reflux disease without esophagitis: Secondary | ICD-10-CM

## 2012-01-28 MED ORDER — ESOMEPRAZOLE MAGNESIUM 40 MG PO CPDR: 40.0000 mg | DELAYED_RELEASE_CAPSULE | Freq: Every day | ORAL | Status: DC

## 2012-01-28 NOTE — Telephone Encounter (Signed)
Pt LMOVM requesting 90 day supply of Nexium with refills to go to [facility]. Rx sent pt aware. Rx done twice shredded printed Rx and e-prescribed Rx.      MW

## 2012-03-03 ENCOUNTER — Other Ambulatory Visit (HOSPITAL_BASED_OUTPATIENT_CLINIC_OR_DEPARTMENT_OTHER): Payer: Self-pay | Admitting: Obstetrics and Gynecology

## 2012-03-03 DIAGNOSIS — Z1231 Encounter for screening mammogram for malignant neoplasm of breast: Secondary | ICD-10-CM

## 2012-03-10 ENCOUNTER — Ambulatory Visit (HOSPITAL_BASED_OUTPATIENT_CLINIC_OR_DEPARTMENT_OTHER): Payer: Managed Care, Other (non HMO)

## 2012-03-25 ENCOUNTER — Ambulatory Visit (HOSPITAL_BASED_OUTPATIENT_CLINIC_OR_DEPARTMENT_OTHER): Payer: Managed Care, Other (non HMO)

## 2012-04-01 ENCOUNTER — Ambulatory Visit (HOSPITAL_BASED_OUTPATIENT_CLINIC_OR_DEPARTMENT_OTHER): Payer: Managed Care, Other (non HMO)

## 2012-04-16 ENCOUNTER — Ambulatory Visit (HOSPITAL_BASED_OUTPATIENT_CLINIC_OR_DEPARTMENT_OTHER): Payer: Managed Care, Other (non HMO)

## 2012-04-18 ENCOUNTER — Other Ambulatory Visit: Payer: Self-pay

## 2012-05-09 ENCOUNTER — Other Ambulatory Visit: Payer: Self-pay | Admitting: Family Medicine

## 2012-06-04 ENCOUNTER — Encounter (INDEPENDENT_AMBULATORY_CARE_PROVIDER_SITE_OTHER): Payer: Managed Care, Other (non HMO)

## 2012-06-11 ENCOUNTER — Encounter (INDEPENDENT_AMBULATORY_CARE_PROVIDER_SITE_OTHER): Payer: Managed Care, Other (non HMO)

## 2012-06-25 ENCOUNTER — Ambulatory Visit (INDEPENDENT_AMBULATORY_CARE_PROVIDER_SITE_OTHER): Payer: Managed Care, Other (non HMO) | Admitting: Physician Assistant

## 2012-06-25 ENCOUNTER — Encounter (INDEPENDENT_AMBULATORY_CARE_PROVIDER_SITE_OTHER): Payer: Self-pay | Admitting: Physician Assistant

## 2012-06-25 VITALS — BP 124/80 | HR 75 | Temp 96.9°F | Ht 66.0 in | Wt 204.4 lb

## 2012-06-25 DIAGNOSIS — Z4651 Encounter for fitting and adjustment of gastric lap band: Secondary | ICD-10-CM

## 2012-06-25 NOTE — Progress Notes (Signed)
  HISTORY: [name] is a 42 y.o.female who received an AP-Standard lap-band in August 2009 by Dr. Daphine Deutscher. She was last seen for an adjustment in August 2013. Unfortunately she began having regurgitation and solid food dysphagia about two weeks ago. She now has occasional liquid dysphagia. No inciting events.  VITAL SIGNS: Filed Vitals:   06/25/12 1238  BP: 124/80  Pulse: 75  Temp: 96.9 F (36.1 C)    PHYSICAL EXAM: Physical exam reveals a very well-appearing 42 y.o.female in no apparent distress Neurologic: Awake, alert, oriented Psych: Bright affect, conversant Respiratory: Breathing even and unlabored. No stridor or wheezing Abdomen: Soft, nontender, nondistended to palpation. Incisions well-healed. No incisional hernias. Port easily palpated. Extremities: Atraumatic, good range of motion.  ASSESMENT: 42 y.o.  female  s/p AP-Standard lap-band.   PLAN: The patient's port was accessed with a 20G Huber needle without difficulty. Clear fluid was aspirated and 3.75 mL saline was removed from the port to give a total predicted volume of 0 mL. The patient was advised to concentrate on healthy food choices and to avoid slider foods high in fats and carbohydrates. I asked her to call us if she continues to have obstructive symptoms. I asked her to speak with Lawson Fiscal who will schedule an upper GI. Otherwise we'll have her back in two weeks.

## 2012-06-25 NOTE — Patient Instructions (Signed)
Return in two weeks. Focus on good food choices as well as physical activity. Return sooner if you have an increase in hunger, portion sizes or weight. Return also for difficulty swallowing, night cough, reflux. If you continue to have problems swallowing, contact [name] here at the office for a possible x-ray.

## 2012-07-09 ENCOUNTER — Encounter (INDEPENDENT_AMBULATORY_CARE_PROVIDER_SITE_OTHER): Payer: Managed Care, Other (non HMO)

## 2012-08-13 ENCOUNTER — Ambulatory Visit (HOSPITAL_BASED_OUTPATIENT_CLINIC_OR_DEPARTMENT_OTHER)
Admission: RE | Admit: 2012-08-13 | Discharge: 2012-08-13 | Disposition: A | Discharge status: Home / Self Care | Payer: Managed Care, Other (non HMO) | Source: Ambulatory Visit | Attending: Obstetrics and Gynecology | Admitting: Obstetrics and Gynecology

## 2012-08-13 DIAGNOSIS — Z1231 Encounter for screening mammogram for malignant neoplasm of breast: Secondary | ICD-10-CM | POA: Insufficient documentation

## 2012-08-20 ENCOUNTER — Encounter (INDEPENDENT_AMBULATORY_CARE_PROVIDER_SITE_OTHER): Payer: Self-pay

## 2012-08-20 ENCOUNTER — Ambulatory Visit (INDEPENDENT_AMBULATORY_CARE_PROVIDER_SITE_OTHER): Payer: Managed Care, Other (non HMO) | Admitting: Physician Assistant

## 2012-08-20 VITALS — BP 118/72 | HR 72 | Temp 99.3°F | Resp 16 | Ht 66.5 in | Wt 215.8 lb

## 2012-08-20 DIAGNOSIS — Z4651 Encounter for fitting and adjustment of gastric lap band: Secondary | ICD-10-CM

## 2012-08-20 NOTE — Progress Notes (Signed)
  HISTORY: [name] is a 42 y.o.female who received an AP-Standard lap-band in August 2009 by Dr. Daphine Deutscher. She comes in with 11 lbs weight gain since her last visit in late April when I removed all fluid secondary to GERD symptoms. She reports that she no longer has any reflux and she's not even taking nexium. Unfortunately, hunger has taken hold and her portion sizes have increased, yielding weight gain. She wants to get back on track with fills today.  VITAL SIGNS: Filed Vitals:   08/20/12 1150  BP: 118/72  Pulse: 72  Temp: 99.3 F (37.4 C)  Resp: 16    PHYSICAL EXAM: Physical exam reveals a very well-appearing 42 y.o.female in no apparent distress Neurologic: Awake, alert, oriented Psych: Bright affect, conversant Respiratory: Breathing even and unlabored. No stridor or wheezing Abdomen: Soft, nontender, nondistended to palpation. Incisions well-healed. No incisional hernias. Port easily palpated. Extremities: Atraumatic, good range of motion.  ASSESMENT: 42 y.o.  female  s/p AP-Standard lap-band.   PLAN: The patient's port was accessed with a 20G Huber needle without difficulty. Clear fluid was aspirated and 2.5 mL saline was added to the port to give a total predicted volume of 2.5 mL. The patient was able to swallow water without difficulty following the procedure and was instructed to take clear liquids for the next 24-48 hours and advance slowly as tolerated. We discussed the fact that we'll need to do serial fills to avoid obstructive symptoms, which she voiced understanding of. We have her scheduled to return in two weeks.

## 2012-08-20 NOTE — Patient Instructions (Signed)
Take clear liquids tonight. Thin protein shakes are ok to start tomorrow morning. Slowly advance your diet thereafter. Call us if you have persistent vomiting or regurgitation, night cough or reflux symptoms. Return as scheduled or sooner if you notice no changes in hunger/portion sizes.  

## 2012-09-03 ENCOUNTER — Ambulatory Visit (INDEPENDENT_AMBULATORY_CARE_PROVIDER_SITE_OTHER): Payer: Managed Care, Other (non HMO) | Admitting: Physician Assistant

## 2012-09-03 ENCOUNTER — Encounter (INDEPENDENT_AMBULATORY_CARE_PROVIDER_SITE_OTHER): Payer: Self-pay

## 2012-09-03 VITALS — BP 130/78 | HR 82 | Temp 97.0°F | Resp 18 | Ht 66.0 in | Wt 214.4 lb

## 2012-09-03 DIAGNOSIS — Z4651 Encounter for fitting and adjustment of gastric lap band: Secondary | ICD-10-CM

## 2012-09-03 NOTE — Progress Notes (Signed)
  HISTORY: [name] is a 42 y.o.female who received an AP-Standard lap-band in August 2009 by Dr. Daphine Deutscher. She comes in with persistent hunger and larger than desired portions although she is doing much better since her last visit with a fill. She denies regurgitation or reflux. She would like a fill today.  VITAL SIGNS: Filed Vitals:   09/03/12 1139  BP: 130/78  Pulse: 82  Temp: 97 F (36.1 C)  Resp: 18    PHYSICAL EXAM: Physical exam reveals a very well-appearing 42 y.o.female in no apparent distress Neurologic: Awake, alert, oriented Psych: Bright affect, conversant Respiratory: Breathing even and unlabored. No stridor or wheezing Abdomen: Soft, nontender, nondistended to palpation. Incisions well-healed. No incisional hernias. Port easily palpated. Extremities: Atraumatic, good range of motion.  ASSESMENT: 42 y.o.  female  s/p AP-Standard lap-band.   PLAN: The patient's port was accessed with a 20G Huber needle without difficulty. Clear fluid was aspirated and 0.5 mL saline was added to the port to give a total predicted volume of 3 mL. The patient was able to swallow water without difficulty following the procedure and was instructed to take clear liquids for the next 24-48 hours and advance slowly as tolerated.

## 2012-09-03 NOTE — Patient Instructions (Signed)
Return in two months. Focus on good food choices as well as physical activity. Return sooner if you have an increase in hunger, portion sizes or weight. Return also for difficulty swallowing, night cough, reflux.   

## 2012-10-03 ENCOUNTER — Other Ambulatory Visit: Payer: Self-pay | Admitting: Family Medicine

## 2012-11-05 ENCOUNTER — Encounter (INDEPENDENT_AMBULATORY_CARE_PROVIDER_SITE_OTHER): Payer: Self-pay

## 2012-11-05 ENCOUNTER — Ambulatory Visit (INDEPENDENT_AMBULATORY_CARE_PROVIDER_SITE_OTHER): Payer: Managed Care, Other (non HMO) | Admitting: Physician Assistant

## 2012-11-05 VITALS — BP 122/78 | HR 68 | Resp 16 | Ht 66.0 in | Wt 215.2 lb

## 2012-11-05 DIAGNOSIS — Z4651 Encounter for fitting and adjustment of gastric lap band: Secondary | ICD-10-CM

## 2012-11-05 NOTE — Progress Notes (Signed)
  HISTORY: [name] is a 42 y.o.female who received an AP-Standard lap-band in August 2009 by Dr. Daphine Deutscher. She comes in with steady weight since her last visit but does report some continued hunger and larger than desired portion sizes. She denies regurgitation or reflux. She would like a fill this morning. She had fluid removed some time ago for obstructive symptoms for which we are adding fluid serially. Her highest fill volume was 5.25 mL.  VITAL SIGNS: Filed Vitals:   11/05/12 1101  BP: 122/78  Pulse: 68  Resp: 16    PHYSICAL EXAM: Physical exam reveals a very well-appearing 42 y.o.female in no apparent distress Neurologic: Awake, alert, oriented Psych: Bright affect, conversant Respiratory: Breathing even and unlabored. No stridor or wheezing Abdomen: Soft, nontender, nondistended to palpation. Incisions well-healed. No incisional hernias. Port easily palpated. Extremities: Atraumatic, good range of motion.  ASSESMENT: 42 y.o.  female  s/p AP-Standard lap-band.   PLAN: The patient's port was accessed with a 20G Huber needle without difficulty. Clear fluid was aspirated and 0.5 mL saline was added to the port to give a total predicted volume of 4 mL. The patient was able to swallow water without difficulty following the procedure and was instructed to take clear liquids for the next 24-48 hours and advance slowly as tolerated.

## 2012-11-05 NOTE — Patient Instructions (Signed)

## 2012-12-07 ENCOUNTER — Encounter: Payer: Self-pay | Admitting: Family Medicine

## 2012-12-07 ENCOUNTER — Ambulatory Visit (INDEPENDENT_AMBULATORY_CARE_PROVIDER_SITE_OTHER): Payer: Managed Care, Other (non HMO) | Admitting: Family Medicine

## 2012-12-07 VITALS — BP 126/74 | HR 80 | Temp 98.3°F | Wt 215.4 lb

## 2012-12-07 DIAGNOSIS — R110 Nausea: Secondary | ICD-10-CM

## 2012-12-07 DIAGNOSIS — R319 Hematuria, unspecified: Secondary | ICD-10-CM

## 2012-12-07 LAB — POCT URINALYSIS DIPSTICK
Glucose, UA: NEGATIVE
Nitrite, UA: NEGATIVE
Spec Grav, UA: 1.025
Urobilinogen, UA: 0.2

## 2012-12-07 MED ORDER — ONDANSETRON HCL 4 MG PO TABS: 4.0000 mg | ORAL_TABLET | Freq: Three times a day (TID) | ORAL | Status: DC | PRN

## 2012-12-07 MED ORDER — CIPROFLOXACIN HCL 500 MG PO TABS: 500.0000 mg | ORAL_TABLET | Freq: Two times a day (BID) | ORAL | Status: AC

## 2012-12-07 NOTE — Progress Notes (Signed)
  Subjective:    [name] is a 42 y.o. female who complains of burning with urination, frequency, hematuria and suprapubic pressure. She has had symptoms for several days. Patient also complains of back pain. Patient denies congestion, cough, fever, headache, rhinitis, sorethroat, stomach ache and vaginal discharge. Patient does have a history of recurrent UTI. Patient does not have a history of pyelonephritis.   The following portions of the patient's history were reviewed and updated as appropriate: allergies, current medications, past family history, past medical history, past social history, past surgical history and problem list.  Review of Systems Pertinent items are noted in HPI.    Objective:    BP 126/74  Pulse 80  Temp(Src) 98.3 F (36.8 C) (Oral)  Wt 215 lb 6.4 oz (97.705 kg)  BMI 34.78 kg/m2  SpO2 99% General appearance: alert, cooperative, appears stated age and no distress Abdomen: soft, non-tender; bowel sounds normal; no masses,  no organomegaly  Laboratory:  Urine dipstick: large for hemoglobin and 3+ for leukocyte esterase.   Micro exam: not done.    Assessment:    Acute cystitis and UTI     Plan:    Medications: ciprofloxacin. Maintain adequate hydration. Follow up if symptoms not improving, and as needed.

## 2012-12-07 NOTE — Patient Instructions (Addendum)
Urinary Tract Infection  Urinary tract infections (UTIs) can develop anywhere along your urinary tract. Your urinary tract is your body's drainage system for removing wastes and extra water. Your urinary tract includes two kidneys, two ureters, a bladder, and a urethra. Your kidneys are a pair of bean-shaped organs. Each kidney is about the size of your fist. They are located below your ribs, one on each side of your spine.  CAUSES  Infections are caused by microbes, which are microscopic organisms, including fungi, viruses, and bacteria. These organisms are so small that they can only be seen through a microscope. Bacteria are the microbes that most commonly cause UTIs.  SYMPTOMS   Symptoms of UTIs may vary by age and gender of the patient and by the location of the infection. Symptoms in young women typically include a frequent and intense urge to urinate and a painful, burning feeling in the bladder or urethra during urination. Older women and men are more likely to be tired, shaky, and weak and have muscle aches and abdominal pain. A fever may mean the infection is in your kidneys. Other symptoms of a kidney infection include pain in your back or sides below the ribs, nausea, and vomiting.  DIAGNOSIS  To diagnose a UTI, your caregiver will ask you about your symptoms. Your caregiver also will ask to provide a urine sample. The urine sample will be tested for bacteria and white blood cells. White blood cells are made by your body to help fight infection.  TREATMENT   Typically, UTIs can be treated with medication. Because most UTIs are caused by a bacterial infection, they usually can be treated with the use of antibiotics. The choice of antibiotic and length of treatment depend on your symptoms and the type of bacteria causing your infection.  HOME CARE INSTRUCTIONS   If you were prescribed antibiotics, take them exactly as your caregiver instructs you. Finish the medication even if you feel better after you  have only taken some of the medication.   Drink enough water and fluids to keep your urine clear or pale yellow.   Avoid caffeine, tea, and carbonated beverages. They tend to irritate your bladder.   Empty your bladder often. Avoid holding urine for long periods of time.   Empty your bladder before and after sexual intercourse.   After a bowel movement, women should cleanse from front to back. Use each tissue only once.  SEEK MEDICAL CARE IF:    You have back pain.   You develop a fever.   Your symptoms do not begin to resolve within 3 days.  SEEK IMMEDIATE MEDICAL CARE IF:    You have severe back pain or lower abdominal pain.   You develop chills.   You have nausea or vomiting.   You have continued burning or discomfort with urination.  MAKE SURE YOU:    Understand these instructions.   Will watch your condition.   Will get help right away if you are not doing well or get worse.  Document Released: 11/28/2004 Document Revised: 08/20/2011 Document Reviewed: 03/29/2011  [employer] Patient Information 2014 [employer].

## 2012-12-08 LAB — URINE CULTURE
Colony Count: NO GROWTH
Organism ID, Bacteria: NO GROWTH

## 2013-01-07 ENCOUNTER — Other Ambulatory Visit: Payer: Self-pay

## 2013-01-14 ENCOUNTER — Encounter (INDEPENDENT_AMBULATORY_CARE_PROVIDER_SITE_OTHER): Payer: Managed Care, Other (non HMO)

## 2013-01-22 ENCOUNTER — Other Ambulatory Visit: Payer: Self-pay | Admitting: Family Medicine

## 2013-01-25 ENCOUNTER — Other Ambulatory Visit: Payer: Self-pay | Admitting: Family Medicine

## 2013-01-25 MED ORDER — AMLODIPINE BESYLATE 10 MG PO TABS: ORAL_TABLET | ORAL | Status: DC

## 2013-01-25 MED ORDER — LEVOTHYROXINE SODIUM 112 MCG PO TABS: ORAL_TABLET | ORAL | Status: DC

## 2013-01-25 NOTE — Telephone Encounter (Signed)
Med's sent through my-chart        KP

## 2013-02-11 ENCOUNTER — Encounter (INDEPENDENT_AMBULATORY_CARE_PROVIDER_SITE_OTHER): Payer: Self-pay

## 2013-02-11 ENCOUNTER — Ambulatory Visit (INDEPENDENT_AMBULATORY_CARE_PROVIDER_SITE_OTHER): Payer: Managed Care, Other (non HMO) | Admitting: Physician Assistant

## 2013-02-11 VITALS — BP 130/86 | HR 68 | Temp 98.4°F | Resp 14 | Ht 66.0 in | Wt 210.0 lb

## 2013-02-11 DIAGNOSIS — Z4651 Encounter for fitting and adjustment of gastric lap band: Secondary | ICD-10-CM

## 2013-02-11 NOTE — Progress Notes (Signed)
  HISTORY: [name] is a 42 y.o.female who received an AP-Standard lap-band in August 2009 by Dr. Daphine Deutscher. She comes in with 5 lbs weight loss since her last visit in September. She has had occasional solid food dysphagia since Thanksgiving. It doesn't occur consistently. She denies nocturnal cough or liquid dysphagia. She is taking Nexium nightly.  VITAL SIGNS: Filed Vitals:   02/11/13 0837  BP: 130/86  Pulse: 68  Temp: 98.4 F (36.9 C)  Resp: 14    PHYSICAL EXAM: Physical exam reveals a very well-appearing 43 y.o.female in no apparent distress Neurologic: Awake, alert, oriented Psych: Bright affect, conversant Respiratory: Breathing even and unlabored. No stridor or wheezing Abdomen: Soft, nontender, nondistended to palpation. Incisions well-healed. No incisional hernias. Port easily palpated. Extremities: Atraumatic, good range of motion.  ASSESMENT: 42 y.o.  female  s/p AP-Standard lap-band.   PLAN: The patient's port was accessed with a 20G Huber needle without difficulty. Clear fluid was aspirated and 0.25 mL saline was removed from the port to give a total predicted volume of 3.75 mL. The patient was advised to concentrate on healthy food choices and to avoid slider foods high in fats and carbohydrates. We discussed regular physical activity as well.

## 2013-02-11 NOTE — Patient Instructions (Signed)
Return in three months. Focus on good food choices as well as physical activity. Return sooner if you have an increase in hunger, portion sizes or weight. Return also for difficulty swallowing, night cough, reflux.   

## 2013-04-15 ENCOUNTER — Encounter (HOSPITAL_BASED_OUTPATIENT_CLINIC_OR_DEPARTMENT_OTHER): Payer: Self-pay | Admitting: Emergency Medicine

## 2013-04-15 ENCOUNTER — Emergency Department (HOSPITAL_BASED_OUTPATIENT_CLINIC_OR_DEPARTMENT_OTHER): Payer: Managed Care, Other (non HMO)

## 2013-04-15 ENCOUNTER — Emergency Department (HOSPITAL_BASED_OUTPATIENT_CLINIC_OR_DEPARTMENT_OTHER)
Admission: EM | Admit: 2013-04-15 | Discharge: 2013-04-15 | Disposition: A | Discharge status: Home / Self Care | Payer: Managed Care, Other (non HMO) | Attending: Emergency Medicine | Admitting: Emergency Medicine

## 2013-04-15 DIAGNOSIS — S99929A Unspecified injury of unspecified foot, initial encounter: Principal | ICD-10-CM

## 2013-04-15 DIAGNOSIS — Z79899 Other long term (current) drug therapy: Secondary | ICD-10-CM | POA: Insufficient documentation

## 2013-04-15 DIAGNOSIS — Z880 Allergy status to penicillin: Secondary | ICD-10-CM | POA: Insufficient documentation

## 2013-04-15 DIAGNOSIS — T733XXA Exhaustion due to excessive exertion, initial encounter: Secondary | ICD-10-CM | POA: Insufficient documentation

## 2013-04-15 DIAGNOSIS — IMO0002 Reserved for concepts with insufficient information to code with codable children: Secondary | ICD-10-CM | POA: Insufficient documentation

## 2013-04-15 DIAGNOSIS — E079 Disorder of thyroid, unspecified: Secondary | ICD-10-CM | POA: Insufficient documentation

## 2013-04-15 DIAGNOSIS — Y9301 Activity, walking, marching and hiking: Secondary | ICD-10-CM | POA: Insufficient documentation

## 2013-04-15 DIAGNOSIS — Z792 Long term (current) use of antibiotics: Secondary | ICD-10-CM | POA: Insufficient documentation

## 2013-04-15 DIAGNOSIS — Z9884 Bariatric surgery status: Secondary | ICD-10-CM | POA: Insufficient documentation

## 2013-04-15 DIAGNOSIS — I10 Essential (primary) hypertension: Secondary | ICD-10-CM | POA: Insufficient documentation

## 2013-04-15 DIAGNOSIS — H547 Unspecified visual loss: Secondary | ICD-10-CM | POA: Insufficient documentation

## 2013-04-15 DIAGNOSIS — S8990XA Unspecified injury of unspecified lower leg, initial encounter: Secondary | ICD-10-CM | POA: Insufficient documentation

## 2013-04-15 DIAGNOSIS — S99919A Unspecified injury of unspecified ankle, initial encounter: Principal | ICD-10-CM

## 2013-04-15 DIAGNOSIS — Y929 Unspecified place or not applicable: Secondary | ICD-10-CM | POA: Insufficient documentation

## 2013-04-15 MED ORDER — HYDROCODONE-ACETAMINOPHEN 5-325 MG PO TABS: 2.0000 | ORAL_TABLET | ORAL | Status: DC | PRN

## 2013-04-15 MED ORDER — IBUPROFEN 800 MG PO TABS: 800.0000 mg | ORAL_TABLET | Freq: Three times a day (TID) | ORAL | Status: DC

## 2013-04-15 NOTE — Discharge Instructions (Signed)
Wear and Tear Disorders of the Knee (Arthritis, Osteoarthritis)  Everyone will experience wear and tear injuries (arthritis, osteoarthritis) of the knee. These are the changes we all get as we age. They come from the joint stress of daily living. The amount of cartilage damage in your knee and your symptoms determine if you need surgery. Mild problems require approximately two months recovery time. More severe problems take several months to recover. With mild problems, your surgeon may find worn and rough cartilage surfaces. With severe changes, your surgeon may find cartilage that has completely worn away and exposed the bone. Loose bodies of bone and cartilage, bone spurs (excess bone growth), and injuries to the menisci (cushions between the large bones of your leg) are also common. All of these problems can cause pain.  For a mild wear and tear problem, rough cartilage may simply need to be shaved and smoothed. For more severe problems with areas of exposed bone, your surgeon may use an instrument for roughing up the bone surfaces to stimulate new cartilage growth. Loose bodies are usually removed. Torn menisci may be trimmed or repaired.  ABOUT THE ARTHROSCOPIC PROCEDURE  Arthroscopy is a surgical technique. It allows your orthopedic surgeon to diagnose and treat your knee injury with accuracy. The surgeon looks into your knee through a small scope. The scope is like a small (pencil-sized) telescope. Arthroscopy is less invasive than open knee surgery. You can expect a more rapid recovery. After the procedure, you will be moved to a recovery area until most of the effects of the medication have worn off. Your caregiver will discuss the test results with you.  RECOVERY  The severity of the arthritis and the type of procedure performed will determine recovery time. Other important factors include age, physical condition, medical conditions, and the type of rehabilitation program. Strengthening your muscles after  arthroscopy helps guarantee a better recovery. Follow your caregiver's instructions. Use crutches, rest, elevate, ice, and do knee exercises as instructed. Your caregivers will help you and instruct you with exercises and other physical therapy required to regain your mobility, muscle strength, and functioning following surgery. Only take over-the-counter or prescription medicines for pain, discomfort, or fever as directed by your caregiver.   SEEK MEDICAL CARE IF:   · There is increased bleeding (more than a small spot) from the wound.  · You notice redness, swelling, or increasing pain in the wound.  · Pus is coming from wound.  · You develop an unexplained oral temperature above 102° F (38.9° C) , or as your caregiver suggests.  · You notice a foul smell coming from the wound or dressing.  · You have severe pain with motion of the knee.  SEEK IMMEDIATE MEDICAL CARE IF:   · You develop a rash.  · You have difficulty breathing.  · You have any allergic problems.  MAKE SURE YOU:   · Understand these instructions.  · Will watch your condition.  · Will get help right away if you are not doing well or get worse.  Document Released: 02/16/2000 Document Revised: 05/13/2011 Document Reviewed: 07/15/2007  [employer]® Patient Information ©2014 [employer].

## 2013-04-15 NOTE — ED Provider Notes (Signed)
CSN: [id number]     Arrival date & time 04/15/13  1431 History   First MD Initiated Contact with Patient 04/15/13 1458     Chief Complaint  Patient presents with  . Leg Pain  . Knee Pain     (Consider location/radiation/quality/duration/timing/severity/associated sxs/prior Treatment) HPI Comments: Patient complains of pain in her left knee on the medial side for the past 2 days. She denies any trauma or falls. She recently started a walking program and the pain became worse yesterday with walking. Packs of without relief. She denies any weakness, numbness or tingling. She denies any previous problems with the knee that has some pain when she was more obese. She denies any vomiting or fever. She denies any pain in other joints.  The history is provided by the patient.    Past Medical History  Diagnosis Date  . Hypertension   . Thyroid disease   . Contact lens/glasses fitting    Past Surgical History  Procedure Laterality Date  . Laparoscopic gastric banding  10/12/2007  . Cesarean section  12/2003  . Myomectomy      2x's. Patient unsure of date of procedure based on history form dated 11/10/2009.  . Abdominal hysterectomy  01/2010    [facility]   Family History  Problem Relation Age of Onset  . Hypertension Mother   . Thyroid disease Mother   . Hypertension Father   . Hypertension Sister   . Cancer Maternal Aunt     colon   History  Substance Use Topics  . Smoking status: Never Smoker   . Smokeless tobacco: Never Used  . Alcohol Use: No   OB History   Grav Para Term Preterm Abortions TAB SAB Ect Mult Living                 Review of Systems  Constitutional: Negative for fever.  Musculoskeletal: Positive for arthralgias and myalgias.  Neurological: Negative for dizziness, weakness and headaches.  A complete 10 system review of systems was obtained and all systems are negative except as noted in the HPI and PMH.      Allergies  Diflucan and Penicillins  Home  Medications   Current Outpatient Rx  Name  Route  Sig  Dispense  Refill  . ALPRAZolam (XANAX) 0.25 MG tablet      1 po tid prn   30 tablet   0   . amLODipine (NORVASC) 10 MG tablet      TAKE 1 TABLET (10 MG TOTAL) BY MOUTH DAILY.   90 tablet   1   . EXPIRED: beclomethasone (QVAR) 40 MCG/ACT inhaler   Inhalation   Inhale 2 puffs into the lungs 2 (two) times daily.   1 Inhaler   12   . EXPIRED: esomeprazole (NEXIUM) 40 MG capsule   Oral   Take 1 capsule (40 mg total) by mouth daily.   90 capsule   2   . HYDROcodone-acetaminophen (NORCO/VICODIN) 5-325 MG per tablet   Oral   Take 2 tablets by mouth every 4 (four) hours as needed.   10 tablet   0   . ibuprofen (ADVIL,MOTRIN) 800 MG tablet   Oral   Take 1 tablet (800 mg total) by mouth 3 (three) times daily.   21 tablet   0   . levothyroxine (SYNTHROID, LEVOTHROID) 112 MCG tablet      TAKE 1 TABLET BY MOUTH EVERY DAY   90 tablet   0   . metroNIDAZOLE (METROGEL) 0.75 %  vaginal gel      1 app pv qhs x 5 days   70 g   1   . nitrofurantoin, macrocrystal-monohydrate, (MACROBID) 100 MG capsule   Oral   Take 100 mg by mouth as needed. Take after intercourse         . ondansetron (ZOFRAN) 4 MG tablet   Oral   Take 1 tablet (4 mg total) by mouth every 8 (eight) hours as needed for nausea.   20 tablet   0   . promethazine (PHENERGAN) 25 MG tablet      1 po qid prn   30 tablet   1   . zolpidem (AMBIEN) 10 MG tablet   Oral   Take 10 mg by mouth at bedtime as needed.          BP 153/91  Pulse 89  Temp(Src) 98.2 F (36.8 C) (Oral)  Resp 18  Ht 5\' 6"  (1.676 m)  Wt 190 lb (86.183 kg)  BMI 30.68 kg/m2  SpO2 100% Physical Exam  Constitutional: She is oriented to person, place, and time. She appears well-developed and well-nourished. No distress.  HENT:  Head: Normocephalic and atraumatic.  Mouth/Throat: Oropharynx is clear and moist. No oropharyngeal exudate.  Eyes: Conjunctivae and EOM are normal.  Pupils are equal, round, and reactive to light.  Neck: Normal range of motion. Neck supple.  Cardiovascular: Normal rate, regular rhythm and normal heart sounds.   Pulmonary/Chest: Effort normal and breath sounds normal. No respiratory distress.  Abdominal: Soft. Bowel sounds are normal. There is no tenderness. There is no rebound and no guarding.  Musculoskeletal: Normal range of motion. She exhibits tenderness. She exhibits no edema.  TTP medial L knee.  Flexion and extension intact. No ligament laxity.  Intact distal pulses.  No calf tenderness. No warmth or erythema.  Able to Range L knee with slight discomfort  Neurological: She is alert and oriented to person, place, and time. No cranial nerve deficit. She exhibits normal muscle tone. Coordination normal.  Skin: Skin is warm.    ED Course  Procedures (including critical care time) Labs Review Labs Reviewed - No data to display Imaging Review Dg Knee 1-2 Views Left  04/15/2013   CLINICAL DATA:  Medial knee pain, leg pain, no known injury  EXAM: LEFT KNEE - 1-2 VIEW  COMPARISON:  None  FINDINGS: Degenerative changes primarily at medial compartment left knee with joint space narrowing and minimal spurring.  Osseous mineralization grossly normal for technique.  No acute fracture, dislocation or bone destruction.  No knee joint effusion.  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   Electronically Signed   By: Lavonia Dana M.D.   On: 04/15/2013 15:13    EKG Interpretation   None       MDM   Final diagnoses:  Knee injury    Medial left knee pain for the past 2 days. No injury. Neurovascularly intact.  X-ray shows degenerative changes. No calf swelling or tenderness, doubt DVT.  Knee immobilizer, antiinflammatories, followup with PCP and sports med.    Ezequiel Essex, MD 04/15/13 [phone]

## 2013-04-15 NOTE — ED Notes (Signed)
Left knee and leg pain that started yesterday.  Denies specific injury.

## 2013-05-13 ENCOUNTER — Ambulatory Visit (INDEPENDENT_AMBULATORY_CARE_PROVIDER_SITE_OTHER): Payer: Managed Care, Other (non HMO) | Admitting: Physician Assistant

## 2013-05-13 ENCOUNTER — Encounter (INDEPENDENT_AMBULATORY_CARE_PROVIDER_SITE_OTHER): Payer: Self-pay

## 2013-05-13 VITALS — BP 128/90 | HR 68 | Temp 99.0°F | Resp 14 | Ht 66.0 in | Wt 218.8 lb

## 2013-05-13 DIAGNOSIS — Z4651 Encounter for fitting and adjustment of gastric lap band: Secondary | ICD-10-CM

## 2013-05-13 NOTE — Patient Instructions (Signed)

## 2013-05-13 NOTE — Progress Notes (Signed)
  HISTORY: [name] is a 43 y.o.female who received an AP-Standard lap-band in August 2009 by Dr. Hassell Done. She comes in today with 9 lbs weight gain since December. She has lost a total of 72 lbs since surgery. I removed 0.25 mL fluid on her last visit for occasional solid food dysphagia for one month. She had a fill two months before that with no difficulty. She complains of increased hunger, increased portion sizes and weight gain. She has also had decreased physical activity due to a torn MCL.  VITAL SIGNS: Filed Vitals:   05/13/13 0837  BP: 128/90  Pulse: 68  Temp: 99 F (37.2 C)  Resp: 14    PHYSICAL EXAM: Physical exam reveals a very well-appearing 43 y.o.female in no apparent distress Neurologic: Awake, alert, oriented Psych: Bright affect, conversant Respiratory: Breathing even and unlabored. No stridor or wheezing Abdomen: Soft, nontender, nondistended to palpation. Incisions well-healed. No incisional hernias. Port easily palpated. Extremities: Atraumatic, good range of motion.  ASSESMENT: 43 y.o.  female  s/p AP-Standard lap-band.   PLAN: The patient's port was accessed with a 20G Huber needle without difficulty. Clear fluid was aspirated and 0.25 mL saline was added to the port to give a total predicted volume of 4 mL. The patient was able to swallow water without difficulty following the procedure and was instructed to take clear liquids for the next 24-48 hours and advance slowly as tolerated. We'll have her back in three months or sooner if needed.

## 2013-05-22 ENCOUNTER — Other Ambulatory Visit: Payer: Self-pay | Admitting: Family Medicine

## 2013-06-17 ENCOUNTER — Encounter (INDEPENDENT_AMBULATORY_CARE_PROVIDER_SITE_OTHER): Payer: Self-pay

## 2013-06-17 ENCOUNTER — Ambulatory Visit (INDEPENDENT_AMBULATORY_CARE_PROVIDER_SITE_OTHER): Payer: Managed Care, Other (non HMO) | Admitting: Physician Assistant

## 2013-06-17 VITALS — BP 128/86 | HR 80 | Temp 97.2°F | Resp 16 | Ht 66.0 in | Wt 221.6 lb

## 2013-06-17 DIAGNOSIS — Z4651 Encounter for fitting and adjustment of gastric lap band: Secondary | ICD-10-CM

## 2013-06-17 NOTE — Patient Instructions (Addendum)

## 2013-06-17 NOTE — Progress Notes (Signed)
  HISTORY: [name] is a 43 y.o.female who received an AP-Standard lap-band in August 2009 by Dr. Hassell Done. She comes in with 3 lbs weight gain since her last visit. She reports some improved restriction but not adequate for losing weight. She is exercising regularly but feels like she is eating more than desired. She denies regurgitation or reflux.Marland Kitchen  VITAL SIGNS: Filed Vitals:   06/17/13 1220  BP: 128/86  Pulse: 80  Temp: 97.2 F (36.2 C)  Resp: 16    PHYSICAL EXAM: Physical exam reveals a very well-appearing 43 y.o.female in no apparent distress Neurologic: Awake, alert, oriented Psych: Bright affect, conversant Respiratory: Breathing even and unlabored. No stridor or wheezing Abdomen: Soft, nontender, nondistended to palpation. Incisions well-healed. No incisional hernias. Port easily palpated. Extremities: Atraumatic, good range of motion.  ASSESMENT: 43 y.o.  female  s/p AP-Standard lap-band.   PLAN: The patient's port was accessed with a 20G Huber needle without difficulty. Clear fluid was aspirated and 0.25 mL saline was added to the port to give a total predicted volume of 4.25 mL. The patient was able to swallow water without difficulty following the procedure and was instructed to take clear liquids for the next 24-48 hours and advance slowly as tolerated.

## 2013-08-13 ENCOUNTER — Encounter: Payer: Self-pay | Admitting: Family Medicine

## 2013-08-13 ENCOUNTER — Ambulatory Visit (INDEPENDENT_AMBULATORY_CARE_PROVIDER_SITE_OTHER): Payer: Managed Care, Other (non HMO) | Admitting: Family Medicine

## 2013-08-13 VITALS — BP 138/82 | HR 84 | Temp 98.2°F | Wt 216.8 lb

## 2013-08-13 DIAGNOSIS — K219 Gastro-esophageal reflux disease without esophagitis: Secondary | ICD-10-CM

## 2013-08-13 DIAGNOSIS — E039 Hypothyroidism, unspecified: Secondary | ICD-10-CM

## 2013-08-13 DIAGNOSIS — I10 Essential (primary) hypertension: Secondary | ICD-10-CM

## 2013-08-13 LAB — HEPATIC FUNCTION PANEL
ALT: 14 U/L (ref 0–35)
AST: 19 U/L (ref 0–37)
Albumin: 3.8 g/dL (ref 3.5–5.2)
Alkaline Phosphatase: 68 U/L (ref 39–117)
BILIRUBIN DIRECT: 0 mg/dL (ref 0.0–0.3)
BILIRUBIN TOTAL: 0.5 mg/dL (ref 0.2–1.2)
Total Protein: 7.6 g/dL (ref 6.0–8.3)

## 2013-08-13 LAB — TSH: TSH: 47.4 u[IU]/mL — ABNORMAL HIGH (ref 0.35–4.50)

## 2013-08-13 LAB — LIPID PANEL
CHOL/HDL RATIO: 3
CHOLESTEROL: 282 mg/dL — AB (ref 0–200)
HDL: 85.8 mg/dL (ref >39.00)
LDL Cholesterol: 183 mg/dL — ABNORMAL HIGH (ref 0–99)
NonHDL: 196.2
TRIGLYCERIDES: 64 mg/dL (ref 0.0–149.0)
VLDL: 12.8 mg/dL (ref 0.0–40.0)

## 2013-08-13 LAB — BASIC METABOLIC PANEL
BUN: 13 mg/dL (ref 6–23)
CALCIUM: 9.6 mg/dL (ref 8.4–10.5)
CO2: 29 mEq/L (ref 19–32)
CREATININE: 0.9 mg/dL (ref 0.4–1.2)
Chloride: 104 mEq/L (ref 96–112)
GFR: 91.93 mL/min (ref >60.00)
Glucose, Bld: 73 mg/dL (ref 70–99)
Potassium: 4.4 mEq/L (ref 3.5–5.1)
Sodium: 140 mEq/L (ref 135–145)

## 2013-08-13 MED ORDER — ESOMEPRAZOLE MAGNESIUM 40 MG PO CPDR: 40.0000 mg | DELAYED_RELEASE_CAPSULE | Freq: Every day | ORAL | Status: DC

## 2013-08-13 MED ORDER — AMLODIPINE BESYLATE 10 MG PO TABS: ORAL_TABLET | ORAL | Status: DC

## 2013-08-13 MED ORDER — LEVOTHYROXINE SODIUM 112 MCG PO TABS: ORAL_TABLET | ORAL | Status: DC

## 2013-08-13 NOTE — Patient Instructions (Signed)

## 2013-08-13 NOTE — Progress Notes (Signed)
  Subjective:    Patient here for follow-up of elevated blood pressure.  She is exercising and is adherent to a low-salt diet.  Blood pressure is well controlled at home. Cardiac symptoms: none. Patient denies: chest pain, chest pressure/discomfort, claudication, dyspnea, exertional chest pressure/discomfort, fatigue, irregular heart beat, lower extremity edema, near-syncope, orthopnea, palpitations, paroxysmal nocturnal dyspnea, syncope and tachypnea. Cardiovascular risk factors: hypertension and obesity (BMI >= 30 kg/m2). Use of agents associated with hypertension: none. History of target organ damage: none.  The following portions of the patient's history were reviewed and updated as appropriate:  She  has a past medical history of Hypertension; Thyroid disease; and Contact lens/glasses fitting. She  does not have any pertinent problems on file. She  has past surgical history that includes Laparoscopic gastric banding (10/12/2007); Cesarean section (12/2003); myomectomy; and Abdominal hysterectomy (01/2010). Her family history includes Cancer in her maternal aunt; Hypertension in her father, mother, and sister; Thyroid disease in her mother. She  reports that she has never smoked. She has never used smokeless tobacco. She reports that she does not drink alcohol or use illicit drugs. She has a current medication list which includes the following prescription(s): amlodipine, esomeprazole, levothyroxine, metronidazole, nitrofurantoin (macrocrystal-monohydrate), ondansetron, and promethazine. Current Outpatient Prescriptions on File Prior to Visit  Medication Sig Dispense Refill  . metroNIDAZOLE (METROGEL) 0.75 % vaginal gel 1 app pv qhs x 5 days  70 g  1  . nitrofurantoin, macrocrystal-monohydrate, (MACROBID) 100 MG capsule Take 100 mg by mouth as needed. Take after intercourse      . ondansetron (ZOFRAN) 4 MG tablet Take 1 tablet (4 mg total) by mouth every 8 (eight) hours as needed for nausea.  20  tablet  0  . promethazine (PHENERGAN) 25 MG tablet 1 po qid prn  30 tablet  1   No current facility-administered medications on file prior to visit.   She is allergic to diflucan and penicillins..  Review of Systems Pertinent items are noted in HPI.     Objective:    BP 138/82  Pulse 84  Temp(Src) 98.2 F (36.8 C) (Oral)  Wt 216 lb 12.8 oz (98.34 kg)  SpO2 98% General appearance: alert, cooperative, appears stated age and no distress Neck: no adenopathy, no carotid bruit, no JVD, supple, symmetrical, trachea midline and thyroid not enlarged, symmetric, no tenderness/mass/nodules Lungs: clear to auscultation bilaterally Heart: S1, S2 normal Extremities: extremities normal, atraumatic, no cyanosis or edema    Assessment:    Hypertension, normal blood pressure . Evidence of target organ damage: none.    Plan:    Medication: no change. Dietary sodium restriction. Regular aerobic exercise. Check blood pressures 2-3 times weekly and record. Follow up: 6 months and as needed.   1. GERD (gastroesophageal reflux disease)   - esomeprazole (NEXIUM) 40 MG capsule; Take 1 capsule (40 mg total) by mouth daily.  Dispense: 90 capsule; Refill: 3  2. Unspecified hypothyroidism   - levothyroxine (SYNTHROID, LEVOTHROID) 112 MCG tablet; 1 tab by mouth daily-  Dispense: 90 tablet; Refill: 3 - TSH  3. HTN (hypertension)   - amLODipine (NORVASC) 10 MG tablet; TAKE 1 TABLET (10 MG TOTAL) BY MOUTH DAILY.  Dispense: 90 tablet; Refill: 3 - Basic metabolic panel - Hepatic function panel - Lipid panel

## 2013-08-13 NOTE — Progress Notes (Signed)
Pre visit review using our clinic review tool, if applicable. No additional management support is needed unless otherwise documented below in the visit note. 

## 2013-08-14 ENCOUNTER — Telehealth: Payer: Self-pay | Admitting: Family Medicine

## 2013-08-14 NOTE — Telephone Encounter (Signed)
Relevant patient education assigned to patient using Emmi. ° °

## 2013-08-16 ENCOUNTER — Ambulatory Visit: Payer: Managed Care, Other (non HMO) | Admitting: Family Medicine

## 2013-08-19 ENCOUNTER — Other Ambulatory Visit: Payer: Self-pay

## 2013-08-19 DIAGNOSIS — E039 Hypothyroidism, unspecified: Secondary | ICD-10-CM

## 2013-08-19 MED ORDER — LEVOTHYROXINE SODIUM 125 MCG PO TABS: 125.0000 ug | ORAL_TABLET | Freq: Every day | ORAL | Status: DC

## 2013-11-17 ENCOUNTER — Telehealth: Payer: Self-pay | Admitting: Family Medicine

## 2013-11-17 DIAGNOSIS — E785 Hyperlipidemia, unspecified: Secondary | ICD-10-CM

## 2013-11-17 DIAGNOSIS — E039 Hypothyroidism, unspecified: Secondary | ICD-10-CM

## 2013-11-17 NOTE — Telephone Encounter (Signed)
Orders in..     KP 

## 2013-11-17 NOTE — Telephone Encounter (Signed)
Pt will be coming in 11/19/2013 at 9:15am for lab work

## 2013-11-19 ENCOUNTER — Other Ambulatory Visit (INDEPENDENT_AMBULATORY_CARE_PROVIDER_SITE_OTHER): Payer: Managed Care, Other (non HMO)

## 2013-11-19 DIAGNOSIS — E785 Hyperlipidemia, unspecified: Secondary | ICD-10-CM

## 2013-11-19 DIAGNOSIS — E039 Hypothyroidism, unspecified: Secondary | ICD-10-CM

## 2013-11-19 LAB — TSH: TSH: 5.83 u[IU]/mL — AB (ref 0.35–4.50)

## 2013-11-19 LAB — HEPATIC FUNCTION PANEL
ALT: 15 U/L (ref 0–35)
AST: 18 U/L (ref 0–37)
Albumin: 3.7 g/dL (ref 3.5–5.2)
Alkaline Phosphatase: 74 U/L (ref 39–117)
BILIRUBIN DIRECT: 0 mg/dL (ref 0.0–0.3)
Total Bilirubin: 0.4 mg/dL (ref 0.2–1.2)
Total Protein: 7.4 g/dL (ref 6.0–8.3)

## 2013-11-19 LAB — LIPID PANEL
CHOL/HDL RATIO: 4
CHOLESTEROL: 233 mg/dL — AB (ref 0–200)
HDL: 66.3 mg/dL (ref >39.00)
LDL CALC: 147 mg/dL — AB (ref 0–99)
NonHDL: 166.7
Triglycerides: 97 mg/dL (ref 0.0–149.0)
VLDL: 19.4 mg/dL (ref 0.0–40.0)

## 2013-11-23 ENCOUNTER — Other Ambulatory Visit: Payer: Self-pay

## 2013-11-23 DIAGNOSIS — E039 Hypothyroidism, unspecified: Secondary | ICD-10-CM

## 2013-11-23 MED ORDER — LEVOTHYROXINE SODIUM 137 MCG PO TABS: 137.0000 ug | ORAL_TABLET | Freq: Every day | ORAL | Status: DC

## 2013-12-30 ENCOUNTER — Ambulatory Visit (INDEPENDENT_AMBULATORY_CARE_PROVIDER_SITE_OTHER): Payer: Managed Care, Other (non HMO) | Admitting: Medical

## 2013-12-30 ENCOUNTER — Encounter: Payer: Self-pay | Admitting: Medical

## 2013-12-30 VITALS — BP 132/98 | HR 94 | Temp 98.4°F | Ht 66.2 in | Wt 213.4 lb

## 2013-12-30 DIAGNOSIS — J069 Acute upper respiratory infection, unspecified: Secondary | ICD-10-CM

## 2013-12-30 MED ORDER — HYDROCODONE-HOMATROPINE 5-1.5 MG/5ML PO SYRP: 5.0000 mL | ORAL_SOLUTION | Freq: Three times a day (TID) | ORAL | Status: DC | PRN

## 2013-12-30 MED ORDER — AZITHROMYCIN 250 MG PO TABS: ORAL_TABLET | ORAL | Status: DC

## 2013-12-30 MED ORDER — FLUTICASONE PROPIONATE 50 MCG/ACT NA SUSP: 2.0000 | Freq: Every day | NASAL | Status: DC

## 2013-12-30 NOTE — Progress Notes (Signed)
Pre visit review using our clinic review tool, if applicable. No additional management support is needed unless otherwise documented below in the visit note. 

## 2013-12-30 NOTE — Assessment & Plan Note (Signed)
Pt appeared to have uri vs allergic rhinitis. For her  cough I rx'd hydrocodone based cough syrup and for her nasal congestion I will rx fluticasone.  I did not think she  needed antibiotic presently on exam. In event she worsens as described(sinusitis, om, bronchitis type symptoms) over the weekend then she can start azithromycin. I will send that to her pharmacy/make available if you worsen.

## 2013-12-30 NOTE — Patient Instructions (Signed)
You appear to have uri vs allergic rhinitis. For your cough I will rx hydrocodone based cough syrup and for your nasal congestion I will rx fluticasone.  I do not think you need antibiotic presently. In event your worsen as described(sinusitis, om, bronchitis type symptoms) over the weekend then you can start azithromycin. I will send that to your pharmacy/make available if you worsen.  Follow up in 7 days or as needed.

## 2013-12-30 NOTE — Progress Notes (Signed)
Subjective:    Patient ID: [name], female    DOB: 10-14-1970, 43 y.o.   MRN: [id number]  HPI  Pt feeling sick since Saturday. Started with mild st but then got cough with chest congestion. Pt bringing up mucous when coughs. No fever or chills. Some nasal congestion but no sinus pressure. Coughing is worse at night. No wheezing and no history of asthma. No hx of smoking but some second hand smoke. No sneezing.  Pt tried some mucinex otc.   Past Medical History  Diagnosis Date  . Hypertension   . Thyroid disease   . Contact lens/glasses fitting     History   Social History  . Marital Status: Divorced    Spouse Name: N/A    Number of Children: N/A  . Years of Education: N/A   Occupational History  . Not on file.   Social History Main Topics  . Smoking status: Never Smoker   . Smokeless tobacco: Never Used  . Alcohol Use: No  . Drug Use: No  . Sexual Activity: Yes    Partners: Male    Birth Control/ Protection: None   Other Topics Concern  . Not on file   Social History Narrative   Exercising---treadmill 4x a week    Past Surgical History  Procedure Laterality Date  . Laparoscopic gastric banding  10/12/2007  . Cesarean section  12/2003  . Myomectomy      2x's. Patient unsure of date of procedure based on history form dated 11/10/2009.  . Abdominal hysterectomy  01/2010    Tah    Family History  Problem Relation Age of Onset  . Hypertension Mother   . Thyroid disease Mother   . Hypertension Father   . Hypertension Sister   . Cancer Maternal Aunt     colon    Allergies  Allergen Reactions  . Diflucan [Fluconazole] Swelling    Blister and swelling in mouth  . Penicillins Rash    Current Outpatient Prescriptions on File Prior to Visit  Medication Sig Dispense Refill  . amLODipine (NORVASC) 10 MG tablet TAKE 1 TABLET (10 MG TOTAL) BY MOUTH DAILY.  90 tablet  3  . esomeprazole (NEXIUM) 40 MG capsule Take 1 capsule (40 mg total) by mouth daily.   90 capsule  3  . levothyroxine (SYNTHROID, LEVOTHROID) 137 MCG tablet Take 1 tablet (137 mcg total) by mouth daily before breakfast. Recheck labs in 2 mos  90 tablet  0  . metroNIDAZOLE (METROGEL) 0.75 % vaginal gel 1 app pv qhs x 5 days  70 g  1  . nitrofurantoin, macrocrystal-monohydrate, (MACROBID) 100 MG capsule Take 100 mg by mouth as needed. Take after intercourse      . promethazine (PHENERGAN) 25 MG tablet 1 po qid prn  30 tablet  1  . ondansetron (ZOFRAN) 4 MG tablet Take 1 tablet (4 mg total) by mouth every 8 (eight) hours as needed for nausea.  20 tablet  0   No current facility-administered medications on file prior to visit.    BP 132/98  Pulse 94  Temp(Src) 98.4 F (36.9 C) (Oral)  Ht 5' 6.2" (1.681 m)  Wt 213 lb 6.4 oz (96.798 kg)  BMI 34.26 kg/m2  SpO2 97%      Review of Systems  Constitutional: Negative for fever, chills, diaphoresis and fatigue.  HENT: Positive for congestion and sore throat. Negative for ear pain, facial swelling, nosebleeds, postnasal drip, rhinorrhea, sinus pressure, tinnitus and trouble swallowing.  Respiratory: Positive for cough. Negative for choking, chest tightness, shortness of breath and wheezing.        Productivee cough  Cardiovascular: Negative for chest pain and palpitations.  Gastrointestinal: Negative.   Endocrine: Negative for polydipsia, polyphagia and polyuria.  Musculoskeletal: Negative.   Skin: Negative.   Neurological: Negative.   Hematological: Negative for adenopathy. Does not bruise/bleed easily.  Psychiatric/Behavioral: Negative.        Objective:   Physical Exam  General  Mental Status - Alert. General Appearance - Well groomed. Not in acute distress.  Skin Rashes- No Rashes.  HEENT Head- Normal. Ear Auditory Canal - Left- Moderate wax. Right - Moderate wax.Tympanic Membrane- Left- Normal. Right- Normal. Eye Sclera/Conjunctiva- Left- Normal. Right- Normal. Nose & Sinuses Nasal Mucosa- Left-  Boggy +  Congested. Right-  Boggy + Congested. No sinus pressure Mouth & Throat Lips: Upper Lip- Normal: no dryness, cracking, pallor, cyanosis, or vesicular eruption. Lower Lip-Normal: no dryness, cracking, pallor, cyanosis or vesicular eruption. Buccal Mucosa- Bilateral- No Aphthous ulcers. Oropharynx- No Discharge or Erythema. Tonsils: Characteristics- Bilateral- No Erythema or Congestion. Size/Enlargement- Bilateral- No enlargement. Discharge- bilateral-None.  Neck Neck- Supple. No Masses.   Chest and Lung Exam Auscultation: Breath Sounds:-Normal. CTA.  Cardiovascular Auscultation:Rythm- Regular, rate and rhythm. Murmurs & Other Heart Sounds:Ausculatation of the heart reveal- No Murmurs.  Lymphatic Head & Neck General Head & Neck Lymphatics: Bilateral: Description- No Localized lymphadenopathy.         Assessment & Plan:

## 2013-12-31 ENCOUNTER — Telehealth: Payer: Self-pay | Admitting: Family Medicine

## 2013-12-31 DIAGNOSIS — R110 Nausea: Secondary | ICD-10-CM

## 2013-12-31 MED ORDER — PROMETHAZINE HCL 25 MG PO TABS: ORAL_TABLET | ORAL | Status: DC

## 2013-12-31 NOTE — Telephone Encounter (Signed)
Caller name: Varney Biles Relation to pt: Call back number:[phone] Pharmacy:[facility]   Reason for call:  Pt wanting a refill on Rx promethazine (PHENERGAN) 25 MG tablet ; still having symptoms of nausea.

## 2013-12-31 NOTE — Telephone Encounter (Signed)
Please advise      KP 

## 2013-12-31 NOTE — Telephone Encounter (Signed)
Nausea since yesterday?---- ok to refill phenergan

## 2014-02-11 ENCOUNTER — Telehealth: Payer: Self-pay | Admitting: Family Medicine

## 2014-02-11 DIAGNOSIS — E785 Hyperlipidemia, unspecified: Secondary | ICD-10-CM

## 2014-02-11 DIAGNOSIS — E039 Hypothyroidism, unspecified: Secondary | ICD-10-CM

## 2014-02-11 NOTE — Telephone Encounter (Signed)
ORDER IN       KP

## 2014-02-11 NOTE — Telephone Encounter (Signed)
Pt requesting lab orders.

## 2014-02-16 ENCOUNTER — Other Ambulatory Visit: Payer: Managed Care, Other (non HMO)

## 2014-02-22 ENCOUNTER — Other Ambulatory Visit (INDEPENDENT_AMBULATORY_CARE_PROVIDER_SITE_OTHER): Payer: Managed Care, Other (non HMO)

## 2014-02-22 DIAGNOSIS — E039 Hypothyroidism, unspecified: Secondary | ICD-10-CM

## 2014-02-22 DIAGNOSIS — E785 Hyperlipidemia, unspecified: Secondary | ICD-10-CM

## 2014-02-22 LAB — HEPATIC FUNCTION PANEL
ALT: 14 U/L (ref 0–35)
AST: 16 U/L (ref 0–37)
Albumin: 3.6 g/dL (ref 3.5–5.2)
Alkaline Phosphatase: 82 U/L (ref 39–117)
BILIRUBIN DIRECT: 0 mg/dL (ref 0.0–0.3)
BILIRUBIN TOTAL: 0.6 mg/dL (ref 0.2–1.2)
TOTAL PROTEIN: 7 g/dL (ref 6.0–8.3)

## 2014-02-22 LAB — LIPID PANEL
CHOLESTEROL: 237 mg/dL — AB (ref 0–200)
HDL: 64.4 mg/dL (ref >39.00)
LDL CALC: 156 mg/dL — AB (ref 0–99)
NonHDL: 172.6
TRIGLYCERIDES: 81 mg/dL (ref 0.0–149.0)
Total CHOL/HDL Ratio: 4
VLDL: 16.2 mg/dL (ref 0.0–40.0)

## 2014-02-22 LAB — TSH: TSH: 16.57 u[IU]/mL — ABNORMAL HIGH (ref 0.35–4.50)

## 2014-02-24 ENCOUNTER — Telehealth: Payer: Self-pay | Admitting: Family Medicine

## 2014-02-24 MED ORDER — LEVOTHYROXINE SODIUM 150 MCG PO TABS: 150.0000 ug | ORAL_TABLET | Freq: Every day | ORAL | Status: DC

## 2014-02-24 NOTE — Telephone Encounter (Signed)
-----   Message from Rosalita Chessman, DO sent at 02/23/2014 11:51 AM EST ----- Increase synthroid to 150 mcg #30  1  A day, 2 refills Cholesterol--- LDL goal < 100,  HDL >50,  TG < 150.  Diet and exercise will increase HDL and decrease LDL and TG.  Fish,  Fish Oil, Flaxseed oil will also help increase the HDL and decrease Triglycerides.   Recheck labs in 2 months--- start zocor 20 mg 1 a day #30 2 refills Tsh, free t3, free t4--- hypothyroid Lipid, hep--hyperlipidemia.

## 2014-02-24 NOTE — Telephone Encounter (Signed)
Caller name: shawnna Relation to pt: self Call back number: [phone] [facility] [address]  Reason for call:   Patient states that Dr. Etter Sjogren had prescribed levothyroxine 166mcg but nothing was called in.

## 2014-02-24 NOTE — Telephone Encounter (Signed)
Rx sent 

## 2014-04-27 ENCOUNTER — Other Ambulatory Visit (INDEPENDENT_AMBULATORY_CARE_PROVIDER_SITE_OTHER): Payer: Managed Care, Other (non HMO)

## 2014-04-27 DIAGNOSIS — E039 Hypothyroidism, unspecified: Secondary | ICD-10-CM

## 2014-04-27 DIAGNOSIS — E785 Hyperlipidemia, unspecified: Secondary | ICD-10-CM

## 2014-04-27 LAB — LIPID PANEL
CHOLESTEROL: 231 mg/dL — AB (ref 0–200)
HDL: 74.5 mg/dL (ref >39.00)
LDL Cholesterol: 144 mg/dL — ABNORMAL HIGH (ref 0–99)
NonHDL: 156.5
Total CHOL/HDL Ratio: 3
Triglycerides: 65 mg/dL (ref 0.0–149.0)
VLDL: 13 mg/dL (ref 0.0–40.0)

## 2014-04-27 LAB — T4, FREE: Free T4: 0.93 ng/dL (ref 0.60–1.60)

## 2014-04-27 LAB — HEPATIC FUNCTION PANEL
ALK PHOS: 103 U/L (ref 39–117)
ALT: 11 U/L (ref 0–35)
AST: 15 U/L (ref 0–37)
Albumin: 3.9 g/dL (ref 3.5–5.2)
Bilirubin, Direct: 0.1 mg/dL (ref 0.0–0.3)
Total Bilirubin: 0.5 mg/dL (ref 0.2–1.2)
Total Protein: 7.2 g/dL (ref 6.0–8.3)

## 2014-04-27 LAB — TSH: TSH: 7.24 u[IU]/mL — ABNORMAL HIGH (ref 0.35–4.50)

## 2014-04-27 LAB — T3, FREE: T3 FREE: 2.7 pg/mL (ref 2.3–4.2)

## 2014-04-27 NOTE — Addendum Note (Signed)
Addended by: Harl Bowie on: 04/27/2014 08:39 AM   Modules accepted: Orders

## 2014-05-02 ENCOUNTER — Telehealth: Payer: Self-pay

## 2014-05-02 DIAGNOSIS — E039 Hypothyroidism, unspecified: Secondary | ICD-10-CM

## 2014-05-02 DIAGNOSIS — E785 Hyperlipidemia, unspecified: Secondary | ICD-10-CM

## 2014-05-02 MED ORDER — LEVOTHYROXINE SODIUM 175 MCG PO TABS: 175.0000 ug | ORAL_TABLET | Freq: Every day | ORAL | Status: DC

## 2014-05-02 NOTE — Telephone Encounter (Signed)
Patient has been made aware and verbalized understanding. The Rx has been sent and she has agreed to recheck her labs in 2 mos. She had been advised to review labs in my-chart.    KP

## 2014-05-02 NOTE — Telephone Encounter (Signed)
-----   Message from Rosalita Chessman, DO sent at 05/01/2014 11:48 AM EST ----- Hypothyroid--- inc synthroid to 175 mcg  #30  2 refills  1 a day Recheck 2 months--TSH-- hypothyroid Cholesterol--- LDL goal < 100,  HDL >40,  TG < 150.  Diet and exercise will increase HDL and decrease LDL and TG.  Fish,  Fish Oil, Flaxseed oil will also help increase the HDL and decrease Triglycerides.   Recheck labs in 2 months--- it has improved.     TSH, lipid, hep.

## 2014-05-24 ENCOUNTER — Other Ambulatory Visit: Payer: Self-pay

## 2014-05-24 MED ORDER — LEVOTHYROXINE SODIUM 175 MCG PO TABS: 175.0000 ug | ORAL_TABLET | Freq: Every day | ORAL | Status: DC

## 2014-05-28 ENCOUNTER — Other Ambulatory Visit: Payer: Self-pay | Admitting: Family Medicine

## 2014-06-30 ENCOUNTER — Telehealth: Payer: Self-pay | Admitting: Family Medicine

## 2014-06-30 NOTE — Telephone Encounter (Signed)
Left detailed message informing patient of this. °

## 2014-06-30 NOTE — Telephone Encounter (Signed)
Caller name:Llesenia Relation to pt: self Call back number: [phone] or [phone] Pharmacy:  Reason for call:   Patient states that she wants labs regarding  levothyroxine. Wants to make sure orders are in before she comes in.

## 2014-06-30 NOTE — Telephone Encounter (Signed)
The orders have been in since Feb.     KP

## 2014-07-02 ENCOUNTER — Other Ambulatory Visit: Payer: Self-pay | Admitting: Family Medicine

## 2014-07-07 ENCOUNTER — Other Ambulatory Visit (INDEPENDENT_AMBULATORY_CARE_PROVIDER_SITE_OTHER): Payer: Managed Care, Other (non HMO)

## 2014-07-07 DIAGNOSIS — E785 Hyperlipidemia, unspecified: Secondary | ICD-10-CM

## 2014-07-07 DIAGNOSIS — E039 Hypothyroidism, unspecified: Secondary | ICD-10-CM

## 2014-07-07 LAB — LIPID PANEL
Cholesterol: 228 mg/dL — ABNORMAL HIGH (ref 0–200)
HDL: 68.2 mg/dL (ref >39.00)
LDL Cholesterol: 142 mg/dL — ABNORMAL HIGH (ref 0–99)
NonHDL: 159.8
TRIGLYCERIDES: 89 mg/dL (ref 0.0–149.0)
Total CHOL/HDL Ratio: 3
VLDL: 17.8 mg/dL (ref 0.0–40.0)

## 2014-07-07 LAB — HEPATIC FUNCTION PANEL
ALBUMIN: 3.6 g/dL (ref 3.5–5.2)
ALT: 12 U/L (ref 0–35)
AST: 15 U/L (ref 0–37)
Alkaline Phosphatase: 106 U/L (ref 39–117)
Bilirubin, Direct: 0.1 mg/dL (ref 0.0–0.3)
Total Bilirubin: 0.5 mg/dL (ref 0.2–1.2)
Total Protein: 7 g/dL (ref 6.0–8.3)

## 2014-07-07 LAB — TSH: TSH: 6.63 u[IU]/mL — AB (ref 0.35–4.50)

## 2014-07-08 ENCOUNTER — Telehealth: Payer: Self-pay | Admitting: Family Medicine

## 2014-07-08 DIAGNOSIS — E039 Hypothyroidism, unspecified: Secondary | ICD-10-CM

## 2014-07-08 MED ORDER — LEVOTHYROXINE SODIUM 200 MCG PO TABS: 200.0000 ug | ORAL_TABLET | Freq: Every day | ORAL | Status: DC

## 2014-07-08 NOTE — Telephone Encounter (Signed)
Synthroid--- 200 mg #30, 1 a day, 2 refills Cholesterol--- LDL goal < 100, HDL >40, TG < 150. Diet and exercise will increase HDL and decrease LDL and TG. Fish, Fish Oil, Flaxseed oil will also help increase the HDL and decrease Triglycerides.  Recheck labs in 6 months------lipid, hep .   Discussed with the patient and she verbalized understanding, she want to take the generic synthroid and see endo, she said the cost of the brand name is a concern for her. Dr.Lowne aware, Rx faxed and ref has been placed.      KP

## 2014-07-08 NOTE — Telephone Encounter (Signed)
Caller name: lenia Relation to pt: self Call back number: [phone] [facility] [place] [facility] [address]  Reason for call:   Patient state that Dr. Etter Sjogren increased the levothyroxine dosage and would like this called in.

## 2014-08-02 ENCOUNTER — Ambulatory Visit: Payer: Managed Care, Other (non HMO) | Admitting: Endocrinology

## 2014-09-18 ENCOUNTER — Other Ambulatory Visit: Payer: Self-pay | Admitting: Family Medicine

## 2014-09-19 NOTE — Telephone Encounter (Signed)
Patient has not been seen in 1 year. please schedule an apt and forward back to me.     KP

## 2014-09-20 ENCOUNTER — Telehealth: Payer: Self-pay | Admitting: Family Medicine

## 2014-09-20 NOTE — Telephone Encounter (Signed)
Caller name: Relation to pt: self  Call back number: [phone] Pharmacy: [id number] [place], [place] [phone] (Phone) [phone] (Fax)         Reason for call:  Patient requesting 3 month supply of amLODipine

## 2014-09-20 NOTE — Telephone Encounter (Signed)
This patient has not been seen in over a year, she needs an apt.      KP

## 2014-09-20 NOTE — Telephone Encounter (Signed)
lvm advising patient  °

## 2014-09-21 NOTE — Telephone Encounter (Signed)
LVM advising patient to schedule appointment

## 2014-09-21 NOTE — Telephone Encounter (Signed)
error:[id number] ° °

## 2014-10-03 ENCOUNTER — Telehealth: Payer: Self-pay | Admitting: Family Medicine

## 2014-10-03 DIAGNOSIS — E039 Hypothyroidism, unspecified: Secondary | ICD-10-CM

## 2014-10-03 NOTE — Telephone Encounter (Signed)
Relation to pt: self Call back number: [employer] [facility] [employer] [facility] [phone] and work [phone]   Reason for call:  Patient requesting lab orders before 10/06/2014 at 1:15pm visit due to her not being able to fast all day. Offered patient a early morning appointment she wanted to hear from Maudie Mercury first. Please advise

## 2014-10-03 NOTE — Telephone Encounter (Signed)
error:[id number] ° °

## 2014-10-03 NOTE — Telephone Encounter (Signed)
Order in and lab apt scheduled for tomorrow. Apt for follow up scheduled for next week.    KP

## 2014-10-04 ENCOUNTER — Other Ambulatory Visit: Payer: Self-pay

## 2014-10-04 ENCOUNTER — Other Ambulatory Visit (INDEPENDENT_AMBULATORY_CARE_PROVIDER_SITE_OTHER): Payer: Managed Care, Other (non HMO)

## 2014-10-04 ENCOUNTER — Other Ambulatory Visit: Payer: Self-pay | Admitting: Family Medicine

## 2014-10-04 DIAGNOSIS — E039 Hypothyroidism, unspecified: Secondary | ICD-10-CM

## 2014-10-04 LAB — TSH: TSH: 5.86 u[IU]/mL — ABNORMAL HIGH (ref 0.35–4.50)

## 2014-10-06 ENCOUNTER — Ambulatory Visit: Payer: Managed Care, Other (non HMO) | Admitting: Family Medicine

## 2014-10-13 ENCOUNTER — Ambulatory Visit (INDEPENDENT_AMBULATORY_CARE_PROVIDER_SITE_OTHER): Payer: Managed Care, Other (non HMO) | Admitting: Family Medicine

## 2014-10-13 ENCOUNTER — Encounter: Payer: Self-pay | Admitting: Family Medicine

## 2014-10-13 VITALS — BP 124/82 | HR 88 | Temp 98.4°F | Ht 66.0 in | Wt 218.0 lb

## 2014-10-13 DIAGNOSIS — E039 Hypothyroidism, unspecified: Secondary | ICD-10-CM

## 2014-10-13 DIAGNOSIS — I10 Essential (primary) hypertension: Secondary | ICD-10-CM | POA: Diagnosis not present

## 2014-10-13 DIAGNOSIS — Z9884 Bariatric surgery status: Secondary | ICD-10-CM

## 2014-10-13 DIAGNOSIS — G43A1 Cyclical vomiting, intractable: Secondary | ICD-10-CM

## 2014-10-13 DIAGNOSIS — R112 Nausea with vomiting, unspecified: Secondary | ICD-10-CM

## 2014-10-13 DIAGNOSIS — R110 Nausea: Secondary | ICD-10-CM

## 2014-10-13 DIAGNOSIS — R1115 Cyclical vomiting syndrome unrelated to migraine: Secondary | ICD-10-CM

## 2014-10-13 DIAGNOSIS — M62838 Other muscle spasm: Secondary | ICD-10-CM

## 2014-10-13 MED ORDER — CYCLOBENZAPRINE HCL 10 MG PO TABS: 10.0000 mg | ORAL_TABLET | Freq: Three times a day (TID) | ORAL | Status: DC | PRN

## 2014-10-13 MED ORDER — ONDANSETRON HCL 4 MG PO TABS: 4.0000 mg | ORAL_TABLET | Freq: Three times a day (TID) | ORAL | Status: DC | PRN

## 2014-10-13 MED ORDER — PROMETHAZINE HCL 25 MG PO TABS: ORAL_TABLET | ORAL | Status: DC

## 2014-10-13 MED ORDER — AMLODIPINE BESYLATE 10 MG PO TABS: 10.0000 mg | ORAL_TABLET | Freq: Every day | ORAL | Status: DC

## 2014-10-13 MED ORDER — LEVOTHYROXINE SODIUM 200 MCG PO TABS: 200.0000 ug | ORAL_TABLET | Freq: Every day | ORAL | Status: DC

## 2014-10-13 NOTE — Assessment & Plan Note (Signed)
Stable  con't norvasc 

## 2014-10-13 NOTE — Patient Instructions (Signed)

## 2014-10-13 NOTE — Progress Notes (Signed)
Pre visit review using our clinic review tool, if applicable. No additional management support is needed unless otherwise documented below in the visit note. 

## 2014-10-13 NOTE — Assessment & Plan Note (Signed)
Pt con't to work on losing weigh Hopefully once we get thyroid regulated that will help

## 2014-10-13 NOTE — Assessment & Plan Note (Signed)
Secondary to lap band  Refill zofran and phenergan

## 2014-10-13 NOTE — Assessment & Plan Note (Signed)
con't thyroid Endo appointment next month

## 2014-10-13 NOTE — Progress Notes (Signed)
Patient ID: [name], female    DOB: October 27, 1970  Age: 44 y.o. MRN: [id number]    Subjective:  Subjective HPI [name] presents for f/u htn and thyroid.  No complaints except muscle spasm R shoulder x few weeks.  She thinks she slept wrong one day.  No other complaints.   Labs reviewed.   Review of Systems  Constitutional: Negative for diaphoresis, appetite change, fatigue and unexpected weight change.  Eyes: Negative for pain, redness and visual disturbance.  Respiratory: Negative for cough, chest tightness, shortness of breath and wheezing.   Cardiovascular: Negative for chest pain, palpitations and leg swelling.  Endocrine: Negative for cold intolerance, heat intolerance, polydipsia, polyphagia and polyuria.  Genitourinary: Negative for dysuria, frequency and difficulty urinating.  Neurological: Negative for dizziness, light-headedness, numbness and headaches.    History Past Medical History  Diagnosis Date  . Hypertension   . Thyroid disease   . Contact lens/glasses fitting     She has past surgical history that includes Laparoscopic gastric banding (10/12/2007); Cesarean section (12/2003); myomectomy; and Abdominal hysterectomy (01/2010).   Her family history includes Cancer in her maternal aunt; Hypertension in her father, mother, and sister; Thyroid disease in her mother.She reports that she has never smoked. She has never used smokeless tobacco. She reports that she does not drink alcohol or use illicit drugs.  Current Outpatient Prescriptions on File Prior to Visit  Medication Sig Dispense Refill  . esomeprazole (NEXIUM) 40 MG capsule Take 1 capsule (40 mg total) by mouth daily. 90 capsule 3  . fluticasone (FLONASE) 50 MCG/ACT nasal spray Place 2 sprays into both nostrils daily. 16 g 1   No current facility-administered medications on file prior to visit.     Objective:  Objective Physical Exam  Constitutional: She is oriented to person, place, and time.  She appears well-developed and well-nourished.  HENT:  Head: Normocephalic and atraumatic.  Eyes: Conjunctivae and EOM are normal.  Neck: Normal range of motion. Neck supple. No JVD present. Carotid bruit is not present. No thyromegaly present.  Cardiovascular: Normal rate, regular rhythm and normal heart sounds.   No murmur heard. Pulmonary/Chest: Effort normal and breath sounds normal. No respiratory distress. She has no wheezes. She has no rales. She exhibits no tenderness.  Musculoskeletal: She exhibits no edema.  Neurological: She is alert and oriented to person, place, and time.  Psychiatric: She has a normal mood and affect. Her behavior is normal.   BP 124/82 mmHg  Pulse 88  Temp(Src) 98.4 F (36.9 C) (Oral)  Ht 5\' 6"  (1.676 m)  Wt 218 lb (98.884 kg)  BMI 35.20 kg/m2  SpO2 98% Wt Readings from Last 3 Encounters:  10/13/14 218 lb (98.884 kg)  12/30/13 213 lb 6.4 oz (96.798 kg)  08/13/13 216 lb 12.8 oz (98.34 kg)     Lab Results  Component Value Date   WBC 7.0 11/11/2011   HGB 11.9* 11/11/2011   HCT 37.5 11/11/2011   PLT 244.0 11/11/2011   GLUCOSE 73 08/13/2013   CHOL 228* 07/07/2014   TRIG 89.0 07/07/2014   HDL 68.20 07/07/2014   LDLDIRECT 116.6 03/14/2011   LDLCALC 142* 07/07/2014   ALT 12 07/07/2014   AST 15 07/07/2014   NA 140 08/13/2013   K 4.4 08/13/2013   CL 104 08/13/2013   CREATININE 0.9 08/13/2013   BUN 13 08/13/2013   CO2 29 08/13/2013   TSH 5.86* 10/04/2014    Dg Knee 1-2 Views Left  04/15/2013  CLINICAL DATA:  Medial knee pain, leg pain, no known injury  EXAM: LEFT KNEE - 1-2 VIEW  COMPARISON:  None  FINDINGS: Degenerative changes primarily at medial compartment left knee with joint space narrowing and minimal spurring.  Osseous mineralization grossly normal for technique.  No acute fracture, dislocation or bone destruction.  No knee joint effusion.  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   Electronically Signed   By:  Lavonia Dana M.D.   On: 04/15/2013 15:13     Assessment & Plan:  Plan I have discontinued [name]'s nitrofurantoin (macrocrystal-monohydrate), metroNIDAZOLE, HYDROcodone-homatropine, and azithromycin. I have also changed her amLODipine. Additionally, I am having her maintain her esomeprazole, fluticasone, levothyroxine, ondansetron, and promethazine.  Meds ordered this encounter  Medications  . amLODipine (NORVASC) 10 MG tablet    Sig: Take 1 tablet (10 mg total) by mouth daily.    Dispense:  90 tablet    Refill:  1  . levothyroxine (SYNTHROID) 200 MCG tablet    Sig: Take 1 tablet (200 mcg total) by mouth daily before breakfast.    Dispense:  90 tablet    Refill:  1  . ondansetron (ZOFRAN) 4 MG tablet    Sig: Take 1 tablet (4 mg total) by mouth every 8 (eight) hours as needed for nausea.    Dispense:  30 tablet    Refill:  2  . promethazine (PHENERGAN) 25 MG tablet    Sig: 1 po qid prn    Dispense:  30 tablet    Refill:  1    Problem List Items Addressed This Visit    None    Visit Diagnoses    Non-intractable vomiting with nausea, vomiting of unspecified type    -  Primary    Relevant Medications    ondansetron (ZOFRAN) 4 MG tablet    Nausea        Relevant Medications    promethazine (PHENERGAN) 25 MG tablet    Hypothyroidism, unspecified hypothyroidism type        Relevant Medications    levothyroxine (SYNTHROID) 200 MCG tablet    Essential hypertension        Relevant Medications    amLODipine (NORVASC) 10 MG tablet       Follow-up: No Follow-up on file.  Garnet Koyanagi, DO

## 2014-10-15 ENCOUNTER — Other Ambulatory Visit: Payer: Self-pay | Admitting: Family Medicine

## 2014-11-09 ENCOUNTER — Other Ambulatory Visit: Payer: Self-pay | Admitting: Obstetrics and Gynecology

## 2014-11-10 ENCOUNTER — Other Ambulatory Visit: Payer: Self-pay | Admitting: Obstetrics and Gynecology

## 2014-11-10 DIAGNOSIS — R928 Other abnormal and inconclusive findings on diagnostic imaging of breast: Secondary | ICD-10-CM

## 2014-11-10 LAB — CYTOLOGY - PAP

## 2014-11-14 ENCOUNTER — Ambulatory Visit (INDEPENDENT_AMBULATORY_CARE_PROVIDER_SITE_OTHER): Payer: Managed Care, Other (non HMO) | Admitting: Internal Medicine

## 2014-11-14 ENCOUNTER — Encounter: Payer: Self-pay | Admitting: Internal Medicine

## 2014-11-14 ENCOUNTER — Other Ambulatory Visit (INDEPENDENT_AMBULATORY_CARE_PROVIDER_SITE_OTHER): Payer: Managed Care, Other (non HMO)

## 2014-11-14 VITALS — BP 128/82 | HR 88 | Temp 98.1°F | Resp 16 | Ht 66.0 in | Wt 212.0 lb

## 2014-11-14 DIAGNOSIS — E039 Hypothyroidism, unspecified: Secondary | ICD-10-CM

## 2014-11-14 NOTE — Patient Instructions (Signed)
Please stop at the lab.  Please come back for labs in 1.5 months.   Take the thyroid hormone every day, with water, >30 minutes before breakfast, separated by >4 hours from acid reflux medications, calcium, iron, multivitamins.  Please come back for a follow-up appointment in 3 months.

## 2014-11-14 NOTE — Progress Notes (Signed)
Patient ID: [name], female   DOB: 08/29/1970, 44 y.o.   MRN: [id number]   HPI  [name] is a 44 y.o.-year-old female, referred by her PCP, Dr. Etter Sjogren, for management of hypothyroidism.  Pt. has been dx with hypothyroidism in ~2005 >> started on Levothyroxine 112 mcg for "years", then increased up to 200 mcg, taken: - not always fasting - with water - no calcium, iron - occasionally multivitamins  - + Nexium - before lunch (4h from LT4)  I reviewed pt's thyroid tests: Lab Results  Component Value Date   TSH 5.86* 10/04/2014   TSH 6.63* 07/07/2014   TSH 7.24* 04/27/2014   TSH 16.57* 02/22/2014   TSH 5.83* 11/19/2013   TSH 47.40* 08/13/2013   TSH 1.20 03/14/2011   TSH 2.99 04/24/2010   TSH 0.82 09/30/2008   TSH 18.23* 08/26/2007   FREET4 0.93 04/27/2014   FREET4 0.3* 01/30/2006  She mentions that she sometimes she forgot to take it in the past...  She had a thyroid U/S (02/20/2006): heterogeneous thyroid, no nodules  Pt denies: - weight gain - fatigue - cold intolerance - depression - constipation - dry skin - hair loss  Pt denies feeling nodules in neck, hoarseness, dysphagia/odynophagia, SOB with lying down.  She has + FH of thyroid disorders in: mother (nodule). No FH of thyroid cancer.  No h/o radiation tx to head or neck. No recent use of iodine supplements. No steroids recently.   I reviewed her chart and she also has a history of Lap Band 2009. She lost 100 lbs (from 300 lbs).  ROS: Constitutional: no weight gain/loss, no fatigue, no subjective hyperthermia/hypothermia Eyes: no blurry vision, no xerophthalmia ENT: no sore throat, no nodules palpated in throat, no dysphagia/odynophagia, no hoarseness Cardiovascular: no CP/SOB/palpitations/leg swelling Respiratory: no cough/SOB Gastrointestinal: no N/V/D/C Musculoskeletal: no muscle/joint aches Skin: no rashes Neurological: no tremors/numbness/tingling/dizziness Psychiatric: no  depression/anxiety  Past Medical History  Diagnosis Date  . Hypertension   . Thyroid disease   . Contact lens/glasses fitting    Past Surgical History  Procedure Laterality Date  . Laparoscopic gastric banding  10/12/2007  . Cesarean section  12/2003  . Myomectomy      2x's. Patient unsure of date of procedure based on history form dated 11/10/2009.  . Abdominal hysterectomy  01/2010    Tah   Social History   Social History  . Marital Status: single    Spouse Name: N/A  . Number of Children: 1   Occupational History  . 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 Smoking status: Never Smoker   . Smokeless tobacco: Never Used  . Alcohol Use: No  . Drug Use: No  . Sexual Activity:    Partners: Male   Social History Narrative   Exercising---treadmill 4x a week   Current Outpatient Prescriptions on File Prior to Visit  Medication Sig Dispense Refill  . amLODipine (NORVASC) 10 MG tablet Take 1 tablet (10 mg total) by mouth daily. 90 tablet 1  . cyclobenzaprine (FLEXERIL) 10 MG tablet Take 1 tablet (10 mg total) by mouth 3 (three) times daily as needed for muscle spasms. 30 tablet 0  . esomeprazole (NEXIUM) 40 MG capsule TAKE 1 CAPSULE (40 MG TOTAL) BY MOUTH DAILY. 90 capsule 3  . fluticasone (FLONASE) 50 MCG/ACT nasal spray Place 2 sprays into both nostrils daily. 16 g 1  . levothyroxine (SYNTHROID) 200 MCG tablet Take 1 tablet (200 mcg total) by mouth daily  before breakfast. 90 tablet 1  . ondansetron (ZOFRAN) 4 MG tablet Take 1 tablet (4 mg total) by mouth every 8 (eight) hours as needed for nausea. 30 tablet 2  . promethazine (PHENERGAN) 25 MG tablet 1 po qid prn 30 tablet 1   No current facility-administered medications on file prior to visit.   Allergies  Allergen Reactions  . Diflucan [Fluconazole] Swelling    Blister and swelling in mouth  . Penicillins Rash   Family History  Problem Relation Age of Onset  . Hypertension Mother   . Thyroid disease  Mother   . Hypertension Father   . Hypertension Sister   . Cancer Maternal Aunt     colon   PE: BP 128/82 mmHg  Pulse 88  Temp(Src) 98.1 F (36.7 C) (Oral)  Resp 16  Ht 5\' 6"  (1.676 m)  Wt 212 lb (96.163 kg)  BMI 34.23 kg/m2  SpO2 98% Wt Readings from Last 3 Encounters:  11/14/14 212 lb (96.163 kg)  10/13/14 218 lb (98.884 kg)  12/30/13 213 lb 6.4 oz (96.798 kg)   Constitutional: overweight, in NAD Eyes: PERRLA, EOMI, no exophthalmos ENT: moist mucous membranes, no thyromegaly, no cervical lymphadenopathy Cardiovascular: RRR, No MRG Respiratory: CTA B Gastrointestinal: abdomen soft, NT, ND, BS+ Musculoskeletal: no deformities, strength intact in all 4 Skin: moist, warm, no rashes Neurological: no tremor with outstretched hands, DTR normal in all 4  ASSESSMENT: 1. Hypothyroidism - poorly controlled - h/o noncompliance with LT4  PLAN:  1. Patient with long-standing hypothyroidism, on levothyroxine therapy. She appears euthyroid. She does not appear to have a goiter, thyroid nodules, or neck compression symptoms - We discussed about correct intake of levothyroxine, fasting, with water, separated by at least 30 minutes from breakfast, and separated by more than 4 hours from calcium, iron, multivitamins, acid reflux medications (PPIs). She is now taking the LT4 as she does out the door in am. She sometimes eats, but sometimes does not eat b'fast >> variable absorption >> advised to move LT4 at waking up (she prefers to take it as she goes out of the house in am but not to eat b'fast) - will check thyroid tests today: TSH, free T4 >> if TSH low >> will decrease LT4 dose. If TSH high, will continue same dose and encourage compliance (see above) - she will need to return in 6-8 weeks for repeat labs - I will see her back in 4 months  She needs refills.   Lab on 11/14/2014  Component Date Value Ref Range Status  . TSH 11/14/2014 0.48  0.35 - 4.50 uIU/mL Final  . Free T4  11/14/2014 1.19  0.60 - 1.60 ng/dL Final

## 2014-11-15 LAB — TSH: TSH: 0.48 u[IU]/mL (ref 0.35–4.50)

## 2014-11-15 LAB — T4, FREE: FREE T4: 1.19 ng/dL (ref 0.60–1.60)

## 2014-11-15 MED ORDER — LEVOTHYROXINE SODIUM 200 MCG PO TABS: 200.0000 ug | ORAL_TABLET | Freq: Every day | ORAL | Status: DC

## 2014-11-16 ENCOUNTER — Ambulatory Visit
Admission: RE | Admit: 2014-11-16 | Discharge: 2014-11-16 | Disposition: A | Discharge status: Home / Self Care | Payer: Managed Care, Other (non HMO) | Source: Ambulatory Visit | Attending: Obstetrics and Gynecology | Admitting: Obstetrics and Gynecology

## 2014-11-16 ENCOUNTER — Other Ambulatory Visit: Payer: Self-pay | Admitting: Obstetrics and Gynecology

## 2014-11-16 DIAGNOSIS — R928 Other abnormal and inconclusive findings on diagnostic imaging of breast: Secondary | ICD-10-CM

## 2014-11-16 DIAGNOSIS — N6489 Other specified disorders of breast: Secondary | ICD-10-CM

## 2014-11-22 ENCOUNTER — Ambulatory Visit
Admission: RE | Admit: 2014-11-22 | Discharge: 2014-11-22 | Disposition: A | Discharge status: Home / Self Care | Payer: Managed Care, Other (non HMO) | Source: Ambulatory Visit | Attending: Obstetrics and Gynecology | Admitting: Obstetrics and Gynecology

## 2014-11-22 ENCOUNTER — Other Ambulatory Visit: Payer: Self-pay | Admitting: Obstetrics and Gynecology

## 2014-11-22 DIAGNOSIS — N6489 Other specified disorders of breast: Secondary | ICD-10-CM

## 2015-03-15 ENCOUNTER — Other Ambulatory Visit (INDEPENDENT_AMBULATORY_CARE_PROVIDER_SITE_OTHER): Payer: Managed Care, Other (non HMO)

## 2015-03-15 DIAGNOSIS — E039 Hypothyroidism, unspecified: Secondary | ICD-10-CM

## 2015-03-15 LAB — TSH: TSH: 1.2 u[IU]/mL (ref 0.35–4.50)

## 2015-03-15 LAB — T4, FREE: Free T4: 0.99 ng/dL (ref 0.60–1.60)

## 2015-03-16 ENCOUNTER — Ambulatory Visit (INDEPENDENT_AMBULATORY_CARE_PROVIDER_SITE_OTHER): Payer: Managed Care, Other (non HMO) | Admitting: Internal Medicine

## 2015-03-16 ENCOUNTER — Encounter: Payer: Self-pay | Admitting: Internal Medicine

## 2015-03-16 ENCOUNTER — Encounter: Payer: Self-pay | Admitting: Family Medicine

## 2015-03-16 VITALS — BP 104/62 | HR 87 | Temp 98.1°F | Resp 12 | Wt 209.0 lb

## 2015-03-16 DIAGNOSIS — E039 Hypothyroidism, unspecified: Secondary | ICD-10-CM

## 2015-03-16 MED ORDER — LEVOTHYROXINE SODIUM 200 MCG PO TABS: 200.0000 ug | ORAL_TABLET | Freq: Every day | ORAL | Status: DC

## 2015-03-16 NOTE — Progress Notes (Signed)
Patient ID: [name], female   DOB: 10-31-1970, 45 y.o.   MRN: [id number]   HPI  [name] is a 45 y.o.-year-old female, returning for f/u for hypothyroidism. Last visit 4 mo ago.  Pt. has been dx with hypothyroidism in ~2005 >> started on Levothyroxine 112 mcg for "years", then increased up to 200 mcg. She has a h/o noncompliance with the LT4, now taking it every day. Also, at last visit, she was taking the LT4 on a full or empty stomach, now only on an empty stomach.  She is now on this dose, taken correctly: - now fasting - with water - no calcium, iron - occasionally multivitamins 4h later - + Nexium - before lunch (4h from LT4)  I reviewed pt's thyroid tests: Lab Results  Component Value Date   TSH 1.20 03/15/2015   TSH 0.48 11/14/2014   TSH 5.86* 10/04/2014   TSH 6.63* 07/07/2014   TSH 7.24* 04/27/2014   TSH 16.57* 02/22/2014   TSH 5.83* 11/19/2013   TSH 47.40* 08/13/2013   TSH 1.20 03/14/2011   TSH 2.99 04/24/2010   FREET4 0.99 03/15/2015   FREET4 1.19 11/14/2014   FREET4 0.93 04/27/2014   FREET4 0.3* 01/30/2006   She had a thyroid U/S (02/20/2006): heterogeneous thyroid, no nodules  Pt has: - + weight loss - started to work with a Clinical research associate. - no fatigue - no cold intolerance - no depression - no constipation - no dry skin - no hair loss  Pt denies feeling nodules in neck, hoarseness, dysphagia/odynophagia, SOB with lying down.  She has + FH of thyroid disorders in: mother (nodule). No FH of thyroid cancer.  No h/o radiation tx to head or neck. No recent use of iodine supplements. No steroids recently.   I reviewed her chart and she also has a history of Lap Band 2009. She lost 100 lbs (from 300 lbs).  ROS: Constitutional: no weight gain/loss, no fatigue, no subjective hyperthermia/hypothermia Eyes: no blurry vision, no xerophthalmia ENT: no sore throat, no nodules palpated in throat, no dysphagia/odynophagia, no hoarseness Cardiovascular: no  CP/SOB/palpitations/leg swelling Respiratory: no cough/SOB Gastrointestinal: no N/V/D/C Musculoskeletal: no muscle/joint aches Skin: no rashes Neurological: no tremors/numbness/tingling/dizziness  I reviewed pt's medications, allergies, PMH, social hx, family hx, and changes were documented in the history of present illness. Otherwise, unchanged from my initial visit note.  Past Medical History  Diagnosis Date  . Hypertension   . Thyroid disease   . Contact lens/glasses fitting    Past Surgical History  Procedure Laterality Date  . Laparoscopic gastric banding  10/12/2007  . Cesarean section  12/2003  . Myomectomy      2x's. Patient unsure of date of procedure based on history form dated 11/10/2009.  . Abdominal hysterectomy  01/2010    Tah   Social History   Social History  . Marital Status: single    Spouse Name: N/A  . Number of Children: 1   Occupational History  . 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 Smoking status: Never Smoker   . Smokeless tobacco: Never Used  . Alcohol Use: No  . Drug Use: No  . Sexual Activity:    Partners: Male   Social History Narrative   Exercising---treadmill 4x a week   Current Outpatient Prescriptions on File Prior to Visit  Medication Sig Dispense Refill  . amLODipine (NORVASC) 10 MG tablet Take 1 tablet (10 mg total) by mouth daily. 90 tablet 1  .  cyclobenzaprine (FLEXERIL) 10 MG tablet Take 1 tablet (10 mg total) by mouth 3 (three) times daily as needed for muscle spasms. 30 tablet 0  . esomeprazole (NEXIUM) 40 MG capsule TAKE 1 CAPSULE (40 MG TOTAL) BY MOUTH DAILY. 90 capsule 3  . fluticasone (FLONASE) 50 MCG/ACT nasal spray Place 2 sprays into both nostrils daily. 16 g 1  . levothyroxine (SYNTHROID) 200 MCG tablet Take 1 tablet (200 mcg total) by mouth daily before breakfast. 45 tablet 1  . metroNIDAZOLE (METROGEL) 0.75 % vaginal gel     . nitrofurantoin, macrocrystal-monohydrate, (MACROBID) 100 MG capsule      . ondansetron (ZOFRAN) 4 MG tablet Take 1 tablet (4 mg total) by mouth every 8 (eight) hours as needed for nausea. 30 tablet 2  . promethazine (PHENERGAN) 25 MG tablet 1 po qid prn 30 tablet 1   No current facility-administered medications on file prior to visit.   Allergies  Allergen Reactions  . Diflucan [Fluconazole] Swelling    Blister and swelling in mouth  . Penicillins Rash   Family History  Problem Relation Age of Onset  . Hypertension Mother   . Thyroid disease Mother   . Hypertension Father   . Hypertension Sister   . Cancer Maternal Aunt     colon   PE: BP 104/62 mmHg  Pulse 87  Temp(Src) 98.1 F (36.7 C) (Oral)  Resp 12  Wt 209 lb (94.802 kg)  SpO2 99% Body mass index is 33.75 kg/(m^2). Wt Readings from Last 3 Encounters:  03/16/15 209 lb (94.802 kg)  11/14/14 212 lb (96.163 kg)  10/13/14 218 lb (98.884 kg)   Constitutional: overweight, in NAD Eyes: PERRLA, EOMI, no exophthalmos ENT: moist mucous membranes, no thyromegaly, no cervical lymphadenopathy Cardiovascular: RRR, No MRG Respiratory: CTA B Gastrointestinal: abdomen soft, NT, ND, BS+ Musculoskeletal: no deformities, strength intact in all 4 Skin: moist, warm, no rashes Neurological: no tremor with outstretched hands, DTR normal in all 4  ASSESSMENT: 1. Hypothyroidism - poorly controlled - h/o noncompliance with LT4, now compliant  PLAN:  1. Patient with long-standing hypothyroidism, on levothyroxine therapy, with previous h/o noncompliance with LT4, no resolved. She appears euthyroid and latest TFTs (obtained yesterday) were perfect! She does not appear to have a goiter, thyroid nodules, or neck compression symptoms. - congratulated her for her weight loss (started working out with a Clinical research associate) - We discussed about correct intake of levothyroxine, fasting, with water, separated by at least 30 minutes from breakfast, and separated by more than 4 hours from calcium, iron, multivitamins, acid reflux  medications (PPIs). She is now taking the LT4 correctly. - refilled the LT4 200 mcg daily for 1 year  - I will see her back in 1 year

## 2015-03-16 NOTE — Patient Instructions (Signed)
Please continue Levothyroxine 200 mcg daily.  Take the thyroid hormone every day, with water, at least 30 minutes before breakfast, separated by at least 4 hours from: - acid reflux medications - calcium - iron - multivitamins  Please return in 1 year.  

## 2015-03-20 ENCOUNTER — Telehealth: Payer: Self-pay | Admitting: Family Medicine

## 2015-03-24 ENCOUNTER — Other Ambulatory Visit: Payer: Self-pay | Admitting: Family Medicine

## 2015-03-24 NOTE — Telephone Encounter (Signed)
To Dr.Gherge for approval. Please advise     KP

## 2015-04-05 NOTE — Telephone Encounter (Signed)
Pt received at work Oct 2016

## 2015-05-10 ENCOUNTER — Other Ambulatory Visit: Payer: Self-pay | Admitting: Family Medicine

## 2015-05-11 ENCOUNTER — Other Ambulatory Visit: Payer: Self-pay | Admitting: Family Medicine

## 2015-10-06 ENCOUNTER — Other Ambulatory Visit: Payer: Self-pay | Admitting: Obstetrics and Gynecology

## 2015-10-06 DIAGNOSIS — Z1231 Encounter for screening mammogram for malignant neoplasm of breast: Secondary | ICD-10-CM

## 2015-11-13 ENCOUNTER — Ambulatory Visit
Admission: RE | Admit: 2015-11-13 | Discharge: 2015-11-13 | Disposition: A | Discharge status: Home / Self Care | Payer: Managed Care, Other (non HMO) | Source: Ambulatory Visit | Attending: Obstetrics and Gynecology | Admitting: Obstetrics and Gynecology

## 2015-11-13 DIAGNOSIS — Z1231 Encounter for screening mammogram for malignant neoplasm of breast: Secondary | ICD-10-CM

## 2015-12-04 ENCOUNTER — Encounter: Payer: Self-pay | Admitting: Family Medicine

## 2015-12-04 ENCOUNTER — Ambulatory Visit (INDEPENDENT_AMBULATORY_CARE_PROVIDER_SITE_OTHER): Payer: Managed Care, Other (non HMO) | Admitting: Family Medicine

## 2015-12-04 VITALS — BP 121/82 | HR 88 | Temp 98.3°F | Ht 66.0 in | Wt 222.4 lb

## 2015-12-04 DIAGNOSIS — M25561 Pain in right knee: Secondary | ICD-10-CM | POA: Diagnosis not present

## 2015-12-04 DIAGNOSIS — R110 Nausea: Secondary | ICD-10-CM | POA: Diagnosis not present

## 2015-12-04 MED ORDER — TRAMADOL HCL 50 MG PO TABS
50.0000 mg | ORAL_TABLET | Freq: Three times a day (TID) | ORAL | 0 refills | Status: DC | PRN
Start: 2015-12-04 — End: 2016-01-23

## 2015-12-04 MED ORDER — PROMETHAZINE HCL 25 MG PO TABS: ORAL_TABLET | ORAL | 1 refills | Status: DC

## 2015-12-04 MED ORDER — CYCLOBENZAPRINE HCL 10 MG PO TABS: 10.0000 mg | ORAL_TABLET | Freq: Three times a day (TID) | ORAL | 0 refills | Status: DC | PRN

## 2015-12-04 NOTE — Progress Notes (Signed)
Patient ID: [name], female    DOB: [birth year]  Age: 45 y.o. MRN: [id number]    Subjective:  Subjective  HPI VERTIA SKOV presents for R knee pain.  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    Review of Systems  Constitutional: Negative for activity change, fatigue, fever and unexpected weight change.  Musculoskeletal: Positive for arthralgias, gait problem and joint swelling. Negative for myalgias, neck pain and neck stiffness.    History Past Medical History:  Diagnosis Date  . Contact lens/glasses fitting   . Hypertension   . Thyroid disease     She has a past surgical history that includes Laparoscopic gastric banding (10/12/2007); Cesarean section (12/2003); myomectomy; and Abdominal hysterectomy (01/2010).   Her family history includes Cancer in her maternal aunt; Hypertension in her father, mother, and sister; Thyroid disease in her mother.She reports that she has never smoked. She has never used smokeless tobacco. She reports that she does not drink alcohol or use drugs.  Current Outpatient Prescriptions on File Prior to Visit  Medication Sig Dispense Refill  . amLODipine (NORVASC) 10 MG tablet TAKE 1 TABLET (10 MG TOTAL) BY MOUTH DAILY. 90 tablet 1  . esomeprazole (NEXIUM) 40 MG capsule TAKE 1 CAPSULE (40 MG TOTAL) BY MOUTH DAILY. 90 capsule 3  . levothyroxine (SYNTHROID, LEVOTHROID) 200 MCG tablet TAKE 1 TABLET (200 MCG TOTAL) BY MOUTH DAILY BEFORE BREAKFAST. 90 tablet 3   No current facility-administered medications on file prior to visit.      Objective:  Objective  Physical Exam  Constitutional: She appears well-developed and well-nourished.  Musculoskeletal:       Right knee: She exhibits decreased range of motion. Tenderness found. Lateral joint line tenderness noted.  Nursing note and vitals reviewed.  BP 121/82 (BP Location: Right Arm, Patient Position: Sitting, Cuff Size: Large)   Pulse 88   Temp 98.3 F (36.8 C) (Oral)   Ht 5\' 6"   (1.676 m)   Wt 222 lb 6.4 oz (100.9 kg)   SpO2 100%   BMI 35.90 kg/m  Wt Readings from Last 3 Encounters:  12/04/15 222 lb 6.4 oz (100.9 kg)  03/16/15 209 lb (94.8 kg)  11/14/14 212 lb (96.2 kg)     Lab Results  Component Value Date   WBC 7.0 11/11/2011   HGB 11.9 (L) 11/11/2011   HCT 37.5 11/11/2011   PLT 244.0 11/11/2011   GLUCOSE 73 08/13/2013   CHOL 228 (H) 07/07/2014   TRIG 89.0 07/07/2014   HDL 68.20 07/07/2014   LDLDIRECT 116.6 03/14/2011   LDLCALC 142 (H) 07/07/2014   ALT 12 07/07/2014   AST 15 07/07/2014   NA 140 08/13/2013   K 4.4 08/13/2013   CL 104 08/13/2013   CREATININE 0.9 08/13/2013   BUN 13 08/13/2013   CO2 29 08/13/2013   TSH 1.20 03/15/2015    Mm Screening Breast Tomo Bilateral  Result Date: 11/14/2015 CLINICAL DATA:  Screening. EXAM: 2D DIGITAL SCREENING BILATERAL MAMMOGRAM WITH CAD AND ADJUNCT TOMO COMPARISON:  Previous exam(s). ACR Breast Density Category b: There are scattered areas of fibroglandular density. FINDINGS: There are no findings suspicious for malignancy. Images were processed with CAD. IMPRESSION: No mammographic evidence of malignancy. A result letter of this screening mammogram will be mailed directly to the patient. RECOMMENDATION: Screeflexerning mammogram in one year. (Code:SM-B-01Y) BI-RADS CATEGORY  1: Negative. Electronically Signed   By: Altamese Cabal M.D.   On: 11/14/2015 08:54     Assessment &  Plan:  Plan  I have discontinued Ms. Overacker's fluticasone, ondansetron, cyclobenzaprine, metroNIDAZOLE, and nitrofurantoin (macrocrystal-monohydrate). I am also having her start on cyclobenzaprine and traMADol. Additionally, I am having her maintain her esomeprazole, levothyroxine, amLODipine, and promethazine.  Meds ordered this encounter  Medications  . cyclobenzaprine (FLEXERIL) 10 MG tablet    Sig: Take 1 tablet (10 mg total) by mouth 3 (three) times daily as needed for muscle spasms.    Dispense:  30 tablet    Refill:  0    . promethazine (PHENERGAN) 25 MG tablet    Sig: 1 po qid prn    Dispense:  30 tablet    Refill:  1  . traMADol (ULTRAM) 50 MG tablet    Sig: Take 1 tablet (50 mg total) by mouth every 8 (eight) hours as needed.    Dispense:  30 tablet    Refill:  0    Problem List Items Addressed This Visit    None    Visit Diagnoses    Acute pain of right knee    -  Primary   Relevant Medications   cyclobenzaprine (FLEXERIL) 10 MG tablet   traMADol (ULTRAM) 50 MG tablet   Nausea       Relevant Medications   promethazine (PHENERGAN) 25 MG tablet    wear splint that you have and use med rx above If no relief --- we will refer to orth / sport med  Follow-up: No Follow-up on file.  Ann Held, DO

## 2015-12-04 NOTE — Patient Instructions (Signed)

## 2015-12-04 NOTE — Progress Notes (Signed)
Pre visit review using our clinic review tool, if applicable. No additional management support is needed unless otherwise documented below in the visit note. 

## 2016-01-18 ENCOUNTER — Other Ambulatory Visit: Payer: Self-pay | Admitting: Family Medicine

## 2016-01-23 ENCOUNTER — Telehealth: Payer: Self-pay | Admitting: Family Medicine

## 2016-01-23 DIAGNOSIS — M25561 Pain in right knee: Secondary | ICD-10-CM

## 2016-01-23 MED ORDER — TRAMADOL HCL 50 MG PO TABS: 50.0000 mg | ORAL_TABLET | Freq: Three times a day (TID) | ORAL | 1 refills | Status: DC | PRN

## 2016-01-23 NOTE — Telephone Encounter (Signed)
Patient is requesting a refill of traMADol (ULTRAM) 50 MG tablet Patient is stating that it helps with the arthritis in her knee, she just takes it occasionally. She would also like a call to know if she needs to pick the rx up here or if it can be sent into the pharmacy? Please advise  Pharmacy: [id number] - [place], [address]  Patient  phone: [phone]

## 2016-01-23 NOTE — Telephone Encounter (Signed)
Rx printed, to PCP for signature. 

## 2016-01-23 NOTE — Telephone Encounter (Signed)
Last OV: 12/04/15 Last filled: 12/04/15, #30, 0 RF  Sig: Take 1 tablet (50 mg total) by mouth every 8 (eight) hours as needed.,  UDS: Not on file

## 2016-01-23 NOTE — Telephone Encounter (Signed)
Refill x1   1 refill 

## 2016-01-24 NOTE — Telephone Encounter (Signed)
Rx faxed to pharmacy, fax confirmation received. 

## 2016-03-15 ENCOUNTER — Ambulatory Visit (INDEPENDENT_AMBULATORY_CARE_PROVIDER_SITE_OTHER): Payer: Managed Care, Other (non HMO) | Admitting: Internal Medicine

## 2016-03-15 ENCOUNTER — Encounter: Payer: Self-pay | Admitting: Internal Medicine

## 2016-03-15 VITALS — BP 130/84 | HR 88 | Ht 66.0 in | Wt 233.0 lb

## 2016-03-15 DIAGNOSIS — E039 Hypothyroidism, unspecified: Secondary | ICD-10-CM | POA: Diagnosis not present

## 2016-03-15 LAB — T4, FREE: FREE T4: 1.99 ng/dL — AB (ref 0.60–1.60)

## 2016-03-15 LAB — TSH: TSH: 0.04 u[IU]/mL — AB (ref 0.35–4.50)

## 2016-03-15 MED ORDER — LEVOTHYROXINE SODIUM 175 MCG PO TABS: 175.0000 ug | ORAL_TABLET | Freq: Every day | ORAL | 1 refills | Status: DC

## 2016-03-15 NOTE — Patient Instructions (Signed)
Please continue Levothyroxine 200 mcg daily.  Take the thyroid hormone every day, with water, at least 30 minutes before breakfast, separated by at least 4 hours from: - acid reflux medications - calcium - iron - multivitamins  Please return in 1 year.

## 2016-03-15 NOTE — Progress Notes (Signed)
Patient ID: [name], female   DOB: 1970/04/06, 46 y.o.   MRN: [id number]   HPI  [name] is a 46 y.o.-year-old female, returning for f/u for hypothyroidism. Last visit 1 year ago.  Pt. has been dx with hypothyroidism in ~2005 >> started on Levothyroxine 112 mcg for "years", then increased up to 200 mcg. She has a h/o noncompliance with the LT4, now taking it every day. Also, at last visit, she was taking the LT4 on a full or empty stomach, now only on an empty stomach.  She is now on this dose, taken correctly: - now fasting - with water - no calcium, iron - occasionally multivitamins 4h later - + occasional Nexium - before lunch (4h from LT4)  I reviewed pt's thyroid tests: Lab Results  Component Value Date   TSH 1.20 03/15/2015   TSH 0.48 11/14/2014   TSH 5.86 (H) 10/04/2014   TSH 6.63 (H) 07/07/2014   TSH 7.24 (H) 04/27/2014   TSH 16.57 (H) 02/22/2014   TSH 5.83 (H) 11/19/2013   TSH 47.40 (H) 08/13/2013   TSH 1.20 03/14/2011   TSH 2.99 04/24/2010   FREET4 0.99 03/15/2015   FREET4 1.19 11/14/2014   FREET4 0.93 04/27/2014   FREET4 0.3 (L) 01/30/2006   She had a thyroid U/S (02/20/2006): heterogeneous thyroid, no nodules  Pt has: - + weight gain - stopped to work with a trainer - no fatigue - no cold intolerance - no depression - no constipation - no dry skin - no hair loss  Pt denies feeling nodules in neck, hoarseness, dysphagia/odynophagia, SOB with lying down.  She has + FH of thyroid disorders in: mother (nodule). No FH of thyroid cancer.  No h/o radiation tx to head or neck. No recent use of iodine supplements. No steroids recently.   I reviewed her chart and she also has a history of Lap Band 2009. She lost 100 lbs (from 300 lbs).  ROS: Constitutional: no weight gain/loss, no fatigue, no subjective hyperthermia/hypothermia Eyes: no blurry vision, no xerophthalmia ENT: no sore throat, no nodules palpated in throat, no dysphagia/odynophagia,  no hoarseness Cardiovascular: no CP/SOB/palpitations/leg swelling Respiratory: no cough/SOB Gastrointestinal: no N/V/D/C Musculoskeletal: no muscle/joint aches Skin: no rashes Neurological: no tremors/numbness/tingling/dizziness  I reviewed pt's medications, allergies, PMH, social hx, family hx, and changes were documented in the history of present illness. Otherwise, unchanged from my initial visit note.  Past Medical History:  Diagnosis Date  . Contact lens/glasses fitting   . Hypertension   . Thyroid disease    Past Surgical History:  Procedure Laterality Date  . ABDOMINAL HYSTERECTOMY  01/2010   [facility]  . CESAREAN SECTION  12/2003  . LAPAROSCOPIC GASTRIC BANDING  10/12/2007  . myomectomy     2x's. Patient unsure of date of procedure based on history form dated 11/10/2009.   Social History   Social History  . Marital Status: single    Spouse Name: N/A  . Number of Children: 1   Occupational History  . 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 Smoking status: Never Smoker   . Smokeless tobacco: Never Used  . Alcohol Use: No  . Drug Use: No  . Sexual Activity:    Partners: Male   Social History Narrative   Exercising---treadmill 4x a week   Current Outpatient Prescriptions on File Prior to Visit  Medication Sig Dispense Refill  . amLODipine (NORVASC) 10 MG tablet TAKE 1 TABLET (10 MG TOTAL) BY MOUTH  DAILY. 90 tablet 1  . cyclobenzaprine (FLEXERIL) 10 MG tablet Take 1 tablet (10 mg total) by mouth 3 (three) times daily as needed for muscle spasms. 30 tablet 0  . esomeprazole (NEXIUM) 40 MG capsule TAKE 1 CAPSULE (40 MG TOTAL) BY MOUTH DAILY. 90 capsule 1  . levothyroxine (SYNTHROID, LEVOTHROID) 200 MCG tablet TAKE 1 TABLET (200 MCG TOTAL) BY MOUTH DAILY BEFORE BREAKFAST. 90 tablet 3  . promethazine (PHENERGAN) 25 MG tablet 1 po qid prn 30 tablet 1  . traMADol (ULTRAM) 50 MG tablet Take 1 tablet (50 mg total) by mouth every 8 (eight) hours as needed. 30  tablet 1   No current facility-administered medications on file prior to visit.    Allergies  Allergen Reactions  . Diflucan [Fluconazole] Swelling    Blister and swelling in mouth  . Penicillins Rash   Family History  Problem Relation Age of Onset  . Hypertension Mother   . Thyroid disease Mother   . Hypertension Father   . Hypertension Sister   . Cancer Maternal Aunt     colon   PE: BP 130/84 (BP Location: Left Arm, Patient Position: Sitting)   Pulse 88   Ht 5\' 6"  (1.676 m)   Wt 233 lb (105.7 kg)   SpO2 93%   BMI 37.61 kg/m  Body mass index is 37.61 kg/m. Wt Readings from Last 3 Encounters:  03/15/16 233 lb (105.7 kg)  12/04/15 222 lb 6.4 oz (100.9 kg)  03/16/15 209 lb (94.8 kg)   Constitutional: overweight, in NAD Eyes: PERRLA, EOMI, no exophthalmos ENT: moist mucous membranes, no thyromegaly, no cervical lymphadenopathy Cardiovascular: RRR, No MRG Respiratory: CTA B Gastrointestinal: abdomen soft, NT, ND, BS+ Musculoskeletal: no deformities, strength intact in all 4 Skin: moist, warm, no rashes Neurological: no tremor with outstretched hands, DTR normal in all 4  ASSESSMENT: 1. Hypothyroidism - poorly controlled - h/o noncompliance with LT4, now compliant  PLAN:  1. Patient with long-standing hypothyroidism, on levothyroxine therapy, with previous h/o noncompliance with LT4, now with stable control after she started to take the med correctly. She appears euthyroid but has gained weight after stopping her workout.  - reviewed atest TFTs (obtained 1 year ago) >> normal. Will repeat today. - She does not appear to have a goiter, thyroid nodules, or neck compression symptoms. Reviewed previous Thyroid U/S results (2007) >> normal  - We discussed about correct intake of levothyroxine, fasting, with water, separated by at least 30 minutes from breakfast, and separated by more than 4 hours from calcium, iron, multivitamins, acid reflux medications (PPIs). She is   taking the LT4 correctly. - will continue LT4 200 mcg daily - I will see her back in 1 year  Needs refills.  Component     Latest Ref Rng & Units 03/15/2016  TSH     0.35 - 4.50 uIU/mL 0.04 (L)  T4,Free(Direct)     0.60 - 1.60 ng/dL 1.99 (H)   TFTs abnormal indicating that the levothyroxine dose is too high. Will decrease the dose to 175 g daily and have her back for labs in 5-6 weeks.  Philemon Kingdom, MD PhD [facility]

## 2016-03-18 ENCOUNTER — Other Ambulatory Visit: Payer: Self-pay | Admitting: Family Medicine

## 2016-04-10 ENCOUNTER — Telehealth: Payer: Self-pay | Admitting: Family Medicine

## 2016-04-10 ENCOUNTER — Emergency Department (HOSPITAL_BASED_OUTPATIENT_CLINIC_OR_DEPARTMENT_OTHER)
Admission: EM | Admit: 2016-04-10 | Discharge: 2016-04-10 | Disposition: A | Discharge status: Home / Self Care | Payer: Managed Care, Other (non HMO) | Attending: Emergency Medicine | Admitting: Emergency Medicine

## 2016-04-10 ENCOUNTER — Encounter (HOSPITAL_BASED_OUTPATIENT_CLINIC_OR_DEPARTMENT_OTHER): Payer: Self-pay

## 2016-04-10 DIAGNOSIS — R69 Illness, unspecified: Secondary | ICD-10-CM

## 2016-04-10 DIAGNOSIS — I10 Essential (primary) hypertension: Secondary | ICD-10-CM | POA: Insufficient documentation

## 2016-04-10 DIAGNOSIS — J029 Acute pharyngitis, unspecified: Secondary | ICD-10-CM | POA: Diagnosis not present

## 2016-04-10 DIAGNOSIS — Z79899 Other long term (current) drug therapy: Secondary | ICD-10-CM | POA: Diagnosis not present

## 2016-04-10 DIAGNOSIS — M791 Myalgia: Secondary | ICD-10-CM | POA: Diagnosis not present

## 2016-04-10 DIAGNOSIS — R509 Fever, unspecified: Secondary | ICD-10-CM | POA: Insufficient documentation

## 2016-04-10 DIAGNOSIS — R05 Cough: Secondary | ICD-10-CM | POA: Insufficient documentation

## 2016-04-10 DIAGNOSIS — J111 Influenza due to unidentified influenza virus with other respiratory manifestations: Secondary | ICD-10-CM

## 2016-04-10 DIAGNOSIS — E039 Hypothyroidism, unspecified: Secondary | ICD-10-CM | POA: Diagnosis not present

## 2016-04-10 NOTE — Discharge Instructions (Signed)
Make sure that you drink at least six 8 ounce glasses of water or Gatorade each day in order to stay well-hydrated. Take Tylenol every 4 hours for temperature higher than 100.4 while awake. See Dr.Lowne in the office not feeling better by next week You are contagious until you are without fever for 24 hours without taking Tylenol. You should stay out of work or school while until 24 hours after you are without fever

## 2016-04-10 NOTE — Telephone Encounter (Signed)
[facility]  Patient Name: [name]  DOB: 11-15-1970    Initial Comment Caller states that she just made an appointment for in the morning. She has a fever of 103.7, chills, cough, sore throat, and headache. She wants to know at what point shoule she go to the ER.    Nurse Assessment  Nurse: Jimmey Ralph, RN, Lissa Date/Time (Eastern Time): 04/10/2016 3:12:40 PM  Confirm and document reason for call. If symptomatic, describe symptoms. ---Caller states that she just made an appointment for in the morning. She has a fever of 103.7, chills, cough, sore throat, and headache. She wants to know at what point should she go to the ER.  Does the patient have any new or worsening symptoms? ---Yes  Will a triage be completed? ---Yes  Related visit to physician within the last 2 weeks? ---No  Does the PT have any chronic conditions? (i.e. diabetes, asthma, etc.) ---No  Is the patient pregnant or possibly pregnant? (Ask all females between the ages of 43-55) ---No  Is this a behavioral health or substance abuse call? ---No     Guidelines    Guideline Title Affirmed Question Affirmed Notes  Fever Fever > 104 F (40 C)    Final Disposition User   See Physician within 4 Hours (or PCP triage) Hammonds, RN, Lissa    Comments  Temp is 104.9   Referrals  REFERRED TO PCP OFFICE   Disagree/Comply: Comply

## 2016-04-10 NOTE — ED Notes (Signed)
ED Provider at bedside. 

## 2016-04-10 NOTE — ED Triage Notes (Signed)
C/o flu like s/x x today-reports fever 104 PTA-took tylenol 330p-NAD-steady gait

## 2016-04-10 NOTE — ED Provider Notes (Addendum)
[facility] Provider Note   CSN: [id number] Arrival date & time: 04/10/16  1607     History   Chief Complaint Chief Complaint  Patient presents with  . Cough    HPI [name] is a 46 y.o. female.Complains of cough fever and sore throat and diffuse myalgias onset this morning. Treated with Tylenol 3:30 PM today. No nausea or vomiting. No other associated symptoms. Nothing makes symptoms better or worse.  HPI  Past Medical History:  Diagnosis Date  . Contact lens/glasses fitting   . Hypertension   . Thyroid disease     Patient Active Problem List   Diagnosis Date Noted  . Nausea with vomiting 10/13/2014  . Upper respiratory infection 12/30/2013  . 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 ACUTE PHARYNGITIS 05/31/2009  . COLD SORE 05/02/2009  . BLISTERS 05/02/2009  . ANGIOEDEMA 05/02/2009  . UTI 03/16/2009  . URI 08/12/2008  . HYPERLIPIDEMIA 08/24/2007  . SHOULDER STRAIN 04/02/2007  . Hypothyroidism 09/03/2006  . OBESITY 09/03/2006  . ALLERGIC RHINITIS, HX OF 09/03/2006  . Essential hypertension 08/13/2006    Past Surgical History:  Procedure Laterality Date  . ABDOMINAL HYSTERECTOMY  01/2010   [facility]  . CESAREAN SECTION  12/2003  . LAPAROSCOPIC GASTRIC BANDING  10/12/2007  . myomectomy     2x's. Patient unsure of date of procedure based on history form dated 11/10/2009.    OB History    No data available       Home Medications    Prior to Admission medications   Medication Sig Start Date End Date Taking? Authorizing Provider  amLODipine (NORVASC) 10 MG tablet TAKE 1 TABLET (10 MG TOTAL) BY MOUTH DAILY. 03/18/16   Rosalita Chessman Chase, DO  cyclobenzaprine (FLEXERIL) 10 MG tablet Take 1 tablet (10 mg total) by mouth 3 (three) times daily as needed for muscle spasms. 12/04/15   Alferd Apa Lowne Chase, DO  esomeprazole (NEXIUM) 40 MG capsule TAKE 1 CAPSULE (40 MG TOTAL) BY MOUTH DAILY. 01/18/16   Rosalita Chessman Chase, DO  levothyroxine (SYNTHROID,  LEVOTHROID) 175 MCG tablet Take 1 tablet (175 mcg total) by mouth daily before breakfast. 03/15/16   Philemon Kingdom, MD  promethazine (PHENERGAN) 25 MG tablet 1 po qid prn 12/04/15   Alferd Apa Lowne Chase, DO  traMADol (ULTRAM) 50 MG tablet Take 1 tablet (50 mg total) by mouth every 8 (eight) hours as needed. 01/23/16   Ann Held, DO    Family History Family History  Problem Relation Age of Onset  . Hypertension Mother   . Thyroid disease Mother   . Hypertension Father   . Hypertension Sister   . Cancer Maternal Aunt     colon    Social History Social History  Substance Use Topics  . Smoking status: Never Smoker  . Smokeless tobacco: Never Used  . Alcohol use No     Allergies   Diflucan [fluconazole] and Penicillins   Review of Systems Review of Systems  Constitutional: Positive for fever.  HENT: Positive for sore throat.   Respiratory: Positive for cough.   Cardiovascular: Negative.   Gastrointestinal: Negative.   Musculoskeletal: Positive for myalgias.  Skin: Negative.   Neurological: Negative.   Psychiatric/Behavioral: Negative.   All other systems reviewed and are negative.    Physical Exam Updated Vital Signs BP 144/70 (BP Location: Right Arm)   Pulse (!) 124   Temp (!) 103.2 F (39.6 C) (Oral) Comment: RN Santiago Glad informed of  Pts vitals  Resp 20   Ht 5\' 6"  (1.676 m)   Wt 230 lb (104.3 kg)   SpO2 100%   BMI 37.12 kg/m   Physical Exam  Constitutional: She is oriented to person, place, and time. She appears well-developed and well-nourished. No distress.  HENT:  Head: Normocephalic and atraumatic.  Mouth/Throat: No oropharyngeal exudate.  biLateral tympanic membranes normal  Eyes: Conjunctivae are normal. Pupils are equal, round, and reactive to light.  Neck: Neck supple. No tracheal deviation present. No thyromegaly present.  Cardiovascular: Regular rhythm.   No murmur heard. Mother tachycardic  Pulmonary/Chest: Effort normal and breath  sounds normal.  Abdominal: Soft. Bowel sounds are normal. She exhibits no distension. There is no tenderness.  Obese  Musculoskeletal: Normal range of motion. She exhibits no edema or tenderness.  Neurological: She is alert and oriented to person, place, and time. Coordination normal.  Skin: Skin is warm and dry. Capillary refill takes less than 2 seconds. No rash noted.  Psychiatric: She has a normal mood and affect.  Nursing note and vitals reviewed.    ED Treatments / Results  Labs (all labs ordered are listed, but only abnormal results are displayed) Labs Reviewed - No data to display  EKG  EKG Interpretation None       Radiology No results found.  Procedures Procedures (including critical care time)  Medications Ordered in ED Medications - No data to display   Initial Impression / Assessment and Plan / ED Course  I have reviewed the triage vital signs and the nursing notes.  Pertinent labs & imaging results that were available during my care of the patient were reviewed by me and considered in my medical decision making (see chart for details).   patient is mildly tachycardic secondary to fever. She is not lightheaded on standing and not acutely ill-appearing. Plan Tylenol encourage hydration. Tamiflu offered the patient which she declines. Follow up with Dr.Lowne Not feeling better in several days  Final Clinical Impressions(s) / ED Diagnoses  Diagnosis influenza-like illness  Final diagnoses:  Influenza-like illness    New Prescriptions New Prescriptions   No medications on file     Orlie Dakin, MD 04/10/16 [place], MD 04/10/16 1740

## 2016-04-11 ENCOUNTER — Ambulatory Visit: Payer: Managed Care, Other (non HMO) | Admitting: Family Medicine

## 2016-04-12 NOTE — Telephone Encounter (Signed)
Pt went to the ER.

## 2016-04-19 ENCOUNTER — Ambulatory Visit (HOSPITAL_BASED_OUTPATIENT_CLINIC_OR_DEPARTMENT_OTHER)
Admission: RE | Admit: 2016-04-19 | Discharge: 2016-04-19 | Disposition: A | Discharge status: Home / Self Care | Payer: Managed Care, Other (non HMO) | Source: Ambulatory Visit | Attending: Family Medicine | Admitting: Family Medicine

## 2016-04-19 ENCOUNTER — Ambulatory Visit (INDEPENDENT_AMBULATORY_CARE_PROVIDER_SITE_OTHER): Payer: Managed Care, Other (non HMO) | Admitting: Family Medicine

## 2016-04-19 ENCOUNTER — Encounter: Payer: Self-pay | Admitting: Family Medicine

## 2016-04-19 ENCOUNTER — Telehealth: Payer: Self-pay | Admitting: Family Medicine

## 2016-04-19 VITALS — BP 98/70 | HR 99 | Temp 99.1°F | Resp 16 | Ht 66.0 in | Wt 226.2 lb

## 2016-04-19 DIAGNOSIS — J111 Influenza due to unidentified influenza virus with other respiratory manifestations: Secondary | ICD-10-CM | POA: Diagnosis not present

## 2016-04-19 DIAGNOSIS — J180 Bronchopneumonia, unspecified organism: Secondary | ICD-10-CM | POA: Insufficient documentation

## 2016-04-19 DIAGNOSIS — R509 Fever, unspecified: Secondary | ICD-10-CM | POA: Diagnosis not present

## 2016-04-19 DIAGNOSIS — E039 Hypothyroidism, unspecified: Secondary | ICD-10-CM

## 2016-04-19 DIAGNOSIS — R52 Pain, unspecified: Secondary | ICD-10-CM | POA: Diagnosis not present

## 2016-04-19 DIAGNOSIS — R05 Cough: Secondary | ICD-10-CM | POA: Diagnosis present

## 2016-04-19 LAB — COMPREHENSIVE METABOLIC PANEL
ALBUMIN: 3.8 g/dL (ref 3.6–5.1)
ALK PHOS: 92 U/L (ref 33–115)
ALT: 16 U/L (ref 6–29)
AST: 15 U/L (ref 10–30)
BILIRUBIN TOTAL: 0.7 mg/dL (ref 0.2–1.2)
BUN: 10 mg/dL (ref 7–25)
CALCIUM: 9.6 mg/dL (ref 8.6–10.2)
CO2: 26 mmol/L (ref 20–31)
Chloride: 102 mmol/L (ref 98–110)
Creat: 0.7 mg/dL (ref 0.50–1.10)
GLUCOSE: 88 mg/dL (ref 65–99)
Potassium: 3.7 mmol/L (ref 3.5–5.3)
Sodium: 138 mmol/L (ref 135–146)
TOTAL PROTEIN: 6.9 g/dL (ref 6.1–8.1)

## 2016-04-19 LAB — POC INFLUENZA A&B (BINAX/QUICKVUE)
INFLUENZA A, POC: NEGATIVE
Influenza B, POC: NEGATIVE

## 2016-04-19 LAB — CBC
HCT: 36.4 % (ref 35.0–45.0)
Hemoglobin: 11.4 g/dL — ABNORMAL LOW (ref 11.7–15.5)
MCH: 25.6 pg — AB (ref 27.0–33.0)
MCHC: 31.3 g/dL — AB (ref 32.0–36.0)
MCV: 81.8 fL (ref 80.0–100.0)
MPV: 10.2 fL (ref 7.5–12.5)
PLATELETS: 345 10*3/uL (ref 140–400)
RBC: 4.45 MIL/uL (ref 3.80–5.10)
RDW: 14.7 % (ref 11.0–15.0)
WBC: 11.8 10*3/uL — AB (ref 3.8–10.8)

## 2016-04-19 LAB — TSH: TSH: 0.02 mIU/L — ABNORMAL LOW

## 2016-04-19 LAB — T4, FREE: FREE T4: 2 ng/dL — AB (ref 0.8–1.8)

## 2016-04-19 MED ORDER — AZITHROMYCIN 250 MG PO TABS: ORAL_TABLET | ORAL | 0 refills | Status: DC

## 2016-04-19 MED ORDER — PROMETHAZINE-DM 6.25-15 MG/5ML PO SYRP: 5.0000 mL | ORAL_SOLUTION | Freq: Four times a day (QID) | ORAL | 0 refills | Status: DC | PRN

## 2016-04-19 NOTE — Telephone Encounter (Signed)
Patient informed of PCP instructions' Scheduled her an appt at 4 pm today 04/19/2016 with PCP

## 2016-04-19 NOTE — Progress Notes (Signed)
Subjective:    Patient ID: [name], female    DOB: 1970/09/24, 46 y.o.   MRN: [id number]   I acted as a Education administrator for Dr. Royden Purl, LPN     HPI  Patient is in today c/o fever, cough, and headache since last Wednesday.  Past Medical History:  Diagnosis Date  . Contact lens/glasses fitting   . Hypertension   . Thyroid disease     Past Surgical History:  Procedure Laterality Date  . ABDOMINAL HYSTERECTOMY  01/2010   [facility]  . CESAREAN SECTION  12/2003  . LAPAROSCOPIC GASTRIC BANDING  10/12/2007  . myomectomy     2x's. Patient unsure of date of procedure based on history form dated 11/10/2009.    Family History  Problem Relation Age of Onset  . Hypertension Mother   . Thyroid disease Mother   . Hypertension Father   . Hypertension Sister   . Cancer Maternal Aunt     colon    Social History   Social History  . Marital status: Divorced    Spouse name: N/A  . Number of children: N/A  . Years of education: N/A   Occupational History  . Not on file.   Social History Main Topics  . Smoking status: Never Smoker  . Smokeless tobacco: Never Used  . Alcohol use No  . Drug use: No  . Sexual activity: Not on file   Other Topics Concern  . Not on file   Social History Narrative   Exercising---treadmill 4x a week    Outpatient Medications Prior to Visit  Medication Sig Dispense Refill  . amLODipine (NORVASC) 10 MG tablet TAKE 1 TABLET (10 MG TOTAL) BY MOUTH DAILY. 90 tablet 1  . cyclobenzaprine (FLEXERIL) 10 MG tablet Take 1 tablet (10 mg total) by mouth 3 (three) times daily as needed for muscle spasms. 30 tablet 0  . esomeprazole (NEXIUM) 40 MG capsule TAKE 1 CAPSULE (40 MG TOTAL) BY MOUTH DAILY. 90 capsule 1  . promethazine (PHENERGAN) 25 MG tablet 1 po qid prn 30 tablet 1  . traMADol (ULTRAM) 50 MG tablet Take 1 tablet (50 mg total) by mouth every 8 (eight) hours as needed. 30 tablet 1  . levothyroxine (SYNTHROID, LEVOTHROID) 175 MCG tablet Take 1  tablet (175 mcg total) by mouth daily before breakfast. 45 tablet 1   No facility-administered medications prior to visit.     Allergies  Allergen Reactions  . Diflucan [Fluconazole] Swelling    Blister and swelling in mouth  . Penicillins Rash    Review of Systems  Constitutional: Negative for fever.  HENT: Negative for congestion.   Eyes: Negative for blurred vision.  Respiratory: Positive for cough.   Cardiovascular: Negative for chest pain and palpitations.  Gastrointestinal: Negative for vomiting.  Musculoskeletal: Negative for back pain.  Skin: Negative for rash.  Neurological: Negative for loss of consciousness and headaches.       Objective:    Physical Exam  Constitutional: She is oriented to person, place, and time. She appears well-developed and well-nourished. No distress.  HENT:  Right Ear: External ear normal.  Left Ear: External ear normal.  Nose: Mucosal edema and rhinorrhea present.  Mouth/Throat: Posterior oropharyngeal erythema present. No oropharyngeal exudate or posterior oropharyngeal edema.  Eyes: EOM are normal. Pupils are equal, round, and reactive to light.  Neck: Normal range of motion. Neck supple.  Cardiovascular: Normal rate, regular rhythm and normal heart sounds.   No murmur heard. Pulmonary/Chest: Effort  normal and breath sounds normal. No respiratory distress. She has no wheezes. She has no rales. She exhibits no tenderness.  Neurological: She is alert and oriented to person, place, and time.  Psychiatric: She has a normal mood and affect. Her behavior is normal. Judgment and thought content normal.  Nursing note and vitals reviewed.   BP 98/70 (BP Location: Left Arm, Patient Position: Sitting, Cuff Size: Large)   Pulse 99   Temp 99.1 F (37.3 C) (Oral)   Resp 16   Ht 5\' 6"  (1.676 m)   Wt 226 lb 3.2 oz (102.6 kg)   SpO2 97%   BMI 36.51 kg/m  Wt Readings from Last 3 Encounters:  04/19/16 226 lb 3.2 oz (102.6 kg)  04/10/16 230 lb  (104.3 kg)  03/15/16 233 lb (105.7 kg)     Lab Results  Component Value Date   WBC 11.8 (H) 04/19/2016   HGB 11.4 (L) 04/19/2016   HCT 36.4 04/19/2016   PLT 345 04/19/2016   GLUCOSE 88 04/19/2016   CHOL 228 (H) 07/07/2014   TRIG 89.0 07/07/2014   HDL 68.20 07/07/2014   LDLDIRECT 116.6 03/14/2011   LDLCALC 142 (H) 07/07/2014   ALT 16 04/19/2016   AST 15 04/19/2016   NA 138 04/19/2016   K 3.7 04/19/2016   CL 102 04/19/2016   CREATININE 0.70 04/19/2016   BUN 10 04/19/2016   CO2 26 04/19/2016   TSH 0.02 (L) 04/19/2016    Lab Results  Component Value Date   TSH 0.02 (L) 04/19/2016   Lab Results  Component Value Date   WBC 11.8 (H) 04/19/2016   HGB 11.4 (L) 04/19/2016   HCT 36.4 04/19/2016   MCV 81.8 04/19/2016   PLT 345 04/19/2016   Lab Results  Component Value Date   NA 138 04/19/2016   K 3.7 04/19/2016   CO2 26 04/19/2016   GLUCOSE 88 04/19/2016   BUN 10 04/19/2016   CREATININE 0.70 04/19/2016   BILITOT 0.7 04/19/2016   ALKPHOS 92 04/19/2016   AST 15 04/19/2016   ALT 16 04/19/2016   PROT 6.9 04/19/2016   ALBUMIN 3.8 04/19/2016   CALCIUM 9.6 04/19/2016   GFR 91.93 08/13/2013   Lab Results  Component Value Date   CHOL 228 (H) 07/07/2014   Lab Results  Component Value Date   HDL 68.20 07/07/2014   Lab Results  Component Value Date   LDLCALC 142 (H) 07/07/2014   Lab Results  Component Value Date   TRIG 89.0 07/07/2014   Lab Results  Component Value Date   CHOLHDL 3 07/07/2014   No results found for: HGBA1C     Assessment & Plan:   Problem List Items Addressed This Visit      Unprioritized   Hypothyroidism   Relevant Orders   T4, free (Completed)   TSH (Completed)   Influenza with respiratory symptoms    Pt has had symptoms too long for tamiflu to work meds per orders rto prn       Other Visit Diagnoses    Fever, unspecified fever cause    -  Primary   Relevant Orders   POC Influenza A&B (Binax test) (Completed)    Comprehensive metabolic panel (Completed)   CBC (Completed)   Body aches       Relevant Orders   POC Influenza A&B (Binax test) (Completed)   DG Chest 2 View (Completed)      I am having [name] start on promethazine-dextromethorphan. I am also  having her maintain her cyclobenzaprine, promethazine, esomeprazole, traMADol, and amLODipine.  Meds ordered this encounter  Medications  . promethazine-dextromethorphan (PROMETHAZINE-DM) 6.25-15 MG/5ML syrup    Sig: Take 5 mLs by mouth 4 (four) times daily as needed for cough.    Dispense:  118 mL    Refill:  0  . DISCONTD: azithromycin (ZITHROMAX) 250 MG tablet    Sig: Take 2 tablets the first day. Then take 1 tablet for 4 days.    Dispense:  6 tablet    Refill:  0    CMA served as scribe during this visit. History, Physical and Plan performed by medical provider. Documentation and orders reviewed and attested to.  Ann Held, DO Patient ID: [name], female   DOB: September 23, 1970, 46 y.o.   MRN: [id number]

## 2016-04-19 NOTE — Progress Notes (Signed)
Pre visit review using our clinic review tool, if applicable. No additional management support is needed unless otherwise documented below in the visit note. 

## 2016-04-19 NOTE — Patient Instructions (Signed)
Fever, Adult °Introduction °A fever is an increase in the body’s temperature. It is often defined as a temperature of 100° F (38°C) or higher. Short mild or moderate fevers often have no long-term effects. They also often do not need treatment. Moderate or high fevers may make you feel uncomfortable. Sometimes, they can also be a sign of a serious illness or disease. The sweating that may happen with repeated fevers or fevers that last a while may also cause you to not have enough fluid in your body (dehydration). °You can take your temperature with a thermometer to see if you have a fever. A measured temperature can change with: °· Age. °· Time of day. °· Where the thermometer is placed: °¨ Mouth (oral). °¨ Rectum (rectal). °¨ Ear (tympanic). °¨ Underarm (axillary). °¨ Forehead (temporal). °Follow these instructions at home: °Pay attention to any changes in your symptoms. Take these actions to help with your condition: °· Take over-the-counter and prescription medicines only as told by your doctor. Follow the dosing instructions carefully. °· If you were prescribed an antibiotic medicine, take it as told by your doctor. Do not stop taking the antibiotic even if you start to feel better. °· Rest as needed. °· Drink enough fluid to keep your pee (urine) clear or pale yellow. °· Sponge yourself or bathe with room-temperature water as needed. This helps to lower your body temperature . Do not use ice water. °· Do not wear too many blankets or heavy clothes. °Contact a doctor if: °· You throw up (vomit). °· You cannot eat or drink without throwing up. °· You have watery poop (diarrhea). °· It hurts when you pee. °· Your symptoms do not get better with treatment. °· You have new symptoms. °· You feel very weak. °Get help right away if: °· You are short of breath or have trouble breathing. °· You are dizzy or you pass out (faint). °· You feel confused. °· You have signs of not having enough fluid in your body, such  as: °¨ A dry mouth. °¨ Peeing less. °¨ Looking pale. °· You have very bad pain in your belly (abdomen). °· You keep throwing up or having water poop. °· You have a skin rash. °· Your symptoms suddenly get worse. °This information is not intended to replace advice given to you by your health care provider. Make sure you discuss any questions you have with your health care provider. °Document Released: 11/28/2007 Document Revised: 07/27/2015 Document Reviewed: 04/14/2014 °© 2017 Elsevier ° °

## 2016-04-19 NOTE — Telephone Encounter (Signed)
She may have something on top of flu.  Too late for tamifly-- she should be seen

## 2016-04-19 NOTE — Telephone Encounter (Signed)
Patient called stating that she called with a 105 fever on last Wednesday 04/10/16. [facility] was [facility] and advised to go to the emergency room. She did and they diagnosed her with the flu. They did not give her any medication that the flu just needed to run its course. She states that finally her temperature got down to 98.6 and she was feeling better. However, she states that today her fever is back at 101.9 and she is feeling crummy again. She would like to know if this is normal or if she needs something called in or needs to be seen? Please advise  Phone: [phone]

## 2016-04-22 ENCOUNTER — Other Ambulatory Visit: Payer: Self-pay | Admitting: Family Medicine

## 2016-04-22 ENCOUNTER — Other Ambulatory Visit: Payer: Self-pay | Admitting: Internal Medicine

## 2016-04-22 DIAGNOSIS — E039 Hypothyroidism, unspecified: Secondary | ICD-10-CM

## 2016-04-22 DIAGNOSIS — J189 Pneumonia, unspecified organism: Secondary | ICD-10-CM

## 2016-04-22 MED ORDER — LEVOTHYROXINE SODIUM 150 MCG PO TABS: 150.0000 ug | ORAL_TABLET | Freq: Every day | ORAL | 1 refills | Status: DC

## 2016-04-22 MED ORDER — AZITHROMYCIN 250 MG PO TABS: ORAL_TABLET | ORAL | 0 refills | Status: DC

## 2016-04-26 ENCOUNTER — Encounter: Payer: Self-pay | Admitting: Family Medicine

## 2016-04-26 ENCOUNTER — Telehealth: Payer: Self-pay | Admitting: Family Medicine

## 2016-04-26 ENCOUNTER — Other Ambulatory Visit: Payer: Self-pay | Admitting: Family Medicine

## 2016-04-26 DIAGNOSIS — J9801 Acute bronchospasm: Secondary | ICD-10-CM

## 2016-04-26 MED ORDER — ALBUTEROL SULFATE 108 (90 BASE) MCG/ACT IN AEPB: 2.0000 | INHALATION_SPRAY | Freq: Four times a day (QID) | RESPIRATORY_TRACT | 0 refills | Status: DC | PRN

## 2016-04-26 NOTE — Telephone Encounter (Signed)
Caller name: Relationship to patient: Self  Can be reached: [phone] Pharmacy: [id number] - [place], [place] - [address]  Reason for call: Request a Rx for an inhaler while she is getting through the flu.

## 2016-04-29 DIAGNOSIS — J111 Influenza due to unidentified influenza virus with other respiratory manifestations: Secondary | ICD-10-CM | POA: Insufficient documentation

## 2016-04-29 NOTE — Telephone Encounter (Signed)
I thought I sent proair in

## 2016-04-29 NOTE — Assessment & Plan Note (Signed)
Pt has had symptoms too long for tamiflu to work meds per orders rto prn

## 2016-04-29 NOTE — Telephone Encounter (Signed)
Think I sent proair in

## 2016-04-30 NOTE — Telephone Encounter (Signed)
Follow up call made to patient states she did pick up [employer] on Friday.

## 2016-05-08 ENCOUNTER — Ambulatory Visit (HOSPITAL_BASED_OUTPATIENT_CLINIC_OR_DEPARTMENT_OTHER)
Admission: RE | Admit: 2016-05-08 | Discharge: 2016-05-08 | Disposition: A | Discharge status: Home / Self Care | Payer: Managed Care, Other (non HMO) | Source: Ambulatory Visit | Attending: Family Medicine | Admitting: Family Medicine

## 2016-05-08 DIAGNOSIS — J189 Pneumonia, unspecified organism: Secondary | ICD-10-CM

## 2016-06-16 ENCOUNTER — Other Ambulatory Visit: Payer: Self-pay | Admitting: Internal Medicine

## 2016-06-17 ENCOUNTER — Telehealth: Payer: Self-pay | Admitting: Internal Medicine

## 2016-06-17 ENCOUNTER — Other Ambulatory Visit: Payer: Self-pay | Admitting: Internal Medicine

## 2016-06-17 DIAGNOSIS — E039 Hypothyroidism, unspecified: Secondary | ICD-10-CM

## 2016-06-17 NOTE — Telephone Encounter (Signed)
[facility] Lev[facility]ine 150 mcg. Per Dr Cruzita Lederer pt requires a TSH and a free T4 before we can refill medication. Called pt on cell number and left a detailed voice mail to call office and schedule a lab appointment.

## 2016-06-18 ENCOUNTER — Other Ambulatory Visit: Payer: Self-pay | Admitting: Family Medicine

## 2016-06-18 ENCOUNTER — Other Ambulatory Visit (INDEPENDENT_AMBULATORY_CARE_PROVIDER_SITE_OTHER): Payer: Managed Care, Other (non HMO)

## 2016-06-18 DIAGNOSIS — E039 Hypothyroidism, unspecified: Secondary | ICD-10-CM | POA: Diagnosis not present

## 2016-06-18 LAB — TSH: TSH: 0.64 u[IU]/mL (ref 0.35–4.50)

## 2016-06-18 MED ORDER — AMLODIPINE BESYLATE 10 MG PO TABS: 10.0000 mg | ORAL_TABLET | Freq: Every day | ORAL | 2 refills | Status: DC

## 2016-06-18 NOTE — Telephone Encounter (Signed)
Spoke with pt this morning she states that she went to her PCP to get her lab work. For [place] to "be on the look out" for her results.

## 2016-06-19 LAB — T4, FREE: Free T4: 0.9 ng/dL (ref 0.60–1.60)

## 2016-06-24 ENCOUNTER — Telehealth: Payer: Self-pay | Admitting: Internal Medicine

## 2016-06-24 ENCOUNTER — Other Ambulatory Visit: Payer: Self-pay

## 2016-06-24 MED ORDER — LEVOTHYROXINE SODIUM 150 MCG PO TABS: 150.0000 ug | ORAL_TABLET | Freq: Every day | ORAL | 1 refills | Status: DC

## 2016-06-24 NOTE — Telephone Encounter (Signed)
Submitted the 150 mcg dose to pharmacy.

## 2016-06-24 NOTE — Telephone Encounter (Signed)
°  Patient stated that the wrong mcg's was sent to the pharmacy for medication levothyroxine. sbe need the prescription   levothyroxine (SYNTHROID, LEVOTHROID) 150 MCG tablet  [facility] to  [id number] [place], [place] - [address] (Phone) [phone] (Fax)

## 2016-06-27 ENCOUNTER — Other Ambulatory Visit: Payer: Self-pay | Admitting: Family Medicine

## 2016-06-27 ENCOUNTER — Encounter: Payer: Self-pay | Admitting: Family Medicine

## 2016-06-27 MED ORDER — AMLODIPINE BESYLATE 10 MG PO TABS: 10.0000 mg | ORAL_TABLET | Freq: Every day | ORAL | 3 refills | Status: DC

## 2016-07-01 ENCOUNTER — Other Ambulatory Visit: Payer: Self-pay

## 2016-07-01 ENCOUNTER — Encounter: Payer: Self-pay | Admitting: Internal Medicine

## 2016-07-01 MED ORDER — LEVOTHYROXINE SODIUM 150 MCG PO TABS: 150.0000 ug | ORAL_TABLET | Freq: Every day | ORAL | 1 refills | Status: DC

## 2016-07-02 ENCOUNTER — Other Ambulatory Visit: Payer: Self-pay

## 2016-11-11 ENCOUNTER — Other Ambulatory Visit: Payer: Self-pay | Admitting: Obstetrics and Gynecology

## 2016-11-11 DIAGNOSIS — Z1231 Encounter for screening mammogram for malignant neoplasm of breast: Secondary | ICD-10-CM

## 2016-11-21 ENCOUNTER — Ambulatory Visit (INDEPENDENT_AMBULATORY_CARE_PROVIDER_SITE_OTHER): Payer: 59 | Admitting: Family Medicine

## 2016-11-21 ENCOUNTER — Other Ambulatory Visit (HOSPITAL_COMMUNITY)
Admission: RE | Admit: 2016-11-21 | Discharge: 2016-11-21 | Disposition: A | Discharge status: Home / Self Care | Payer: 59 | Source: Ambulatory Visit | Attending: Family Medicine | Admitting: Family Medicine

## 2016-11-21 VITALS — BP 126/74 | HR 109 | Temp 98.2°F | Ht 66.0 in | Wt 224.6 lb

## 2016-11-21 DIAGNOSIS — R8290 Unspecified abnormal findings in urine: Secondary | ICD-10-CM

## 2016-11-21 DIAGNOSIS — R110 Nausea: Secondary | ICD-10-CM | POA: Diagnosis not present

## 2016-11-21 LAB — POCT URINALYSIS DIPSTICK
Bilirubin, UA: NEGATIVE
Glucose, UA: NEGATIVE
Ketones, UA: NEGATIVE
Leukocytes, UA: NEGATIVE
Nitrite, UA: NEGATIVE
Protein, UA: NEGATIVE
RBC UA: NEGATIVE
SPEC GRAV UA: 1.025 (ref 1.010–1.025)
Urobilinogen, UA: 0.2 E.U./dL
pH, UA: 6 (ref 5.0–8.0)

## 2016-11-21 MED ORDER — PROMETHAZINE HCL 25 MG PO TABS: ORAL_TABLET | ORAL | 1 refills | Status: DC

## 2016-11-21 MED ORDER — CIPROFLOXACIN HCL 250 MG PO TABS: 250.0000 mg | ORAL_TABLET | Freq: Two times a day (BID) | ORAL | 0 refills | Status: DC

## 2016-11-21 NOTE — Progress Notes (Signed)
Patient ID: [name], female    DOB: 19-Mar-1970  Age: 46 y.o. MRN: [id number]    Subjective:  Subjective  HPI [name] presents for dysuria, frequency  Review of Systems  Constitutional: Negative for appetite change, diaphoresis, fatigue and unexpected weight change.  Eyes: Negative for pain, redness and visual disturbance.  Respiratory: Negative for cough, chest tightness, shortness of breath and wheezing.   Cardiovascular: Negative for chest pain, palpitations and leg swelling.  Endocrine: Negative for cold intolerance, heat intolerance, polydipsia, polyphagia and polyuria.  Genitourinary: Positive for dysuria and frequency. Negative for difficulty urinating.  Neurological: Negative for dizziness, light-headedness, numbness and headaches.    History Past Medical History:  Diagnosis Date  . Contact lens/glasses fitting   . Hypertension   . Thyroid disease     She has a past surgical history that includes Laparoscopic gastric banding (10/12/2007); Cesarean section (12/2003); myomectomy; and Abdominal hysterectomy (01/2010).   Her family history includes Cancer in her maternal aunt; Hypertension in her father, mother, and sister; Thyroid disease in her mother.She reports that she has never smoked. She has never used smokeless tobacco. She reports that she does not drink alcohol or use drugs.  Current Outpatient Prescriptions on File Prior to Visit  Medication Sig Dispense Refill  . Albuterol Sulfate (PROAIR RESPICLICK) 734 (90 Base) MCG/ACT AEPB Inhale 2 puffs into the lungs 4 (four) times daily as needed. 1 each 0  . amLODipine (NORVASC) 10 MG tablet Take 1 tablet (10 mg total) by mouth daily. 90 tablet 3  . cyclobenzaprine (FLEXERIL) 10 MG tablet Take 1 tablet (10 mg total) by mouth 3 (three) times daily as needed for muscle spasms. 30 tablet 0  . esomeprazole (NEXIUM) 40 MG capsule TAKE 1 CAPSULE (40 MG TOTAL) BY MOUTH DAILY. 90 capsule 1  . levothyroxine  (SYNTHROID, LEVOTHROID) 150 MCG tablet Take 1 tablet (150 mcg total) by mouth daily. 90 tablet 1  . levothyroxine (SYNTHROID, LEVOTHROID) 200 MCG tablet TAKE 1 TABLET (200 MCG TOTAL) BY MOUTH DAILY BEFORE BREAKFAST. 90 tablet 2  . traMADol (ULTRAM) 50 MG tablet Take 1 tablet (50 mg total) by mouth every 8 (eight) hours as needed. 30 tablet 1  . promethazine-dextromethorphan (PROMETHAZINE-DM) 6.25-15 MG/5ML syrup Take 5 mLs by mouth 4 (four) times daily as needed for cough. (Patient not taking: Reported on 11/21/2016) 118 mL 0   No current facility-administered medications on file prior to visit.      Objective:  Objective  Physical Exam  Constitutional: She is oriented to person, place, and time. She appears well-developed and well-nourished.  HENT:  Head: Normocephalic and atraumatic.  Eyes: Conjunctivae and EOM are normal.  Neck: Normal range of motion. Neck supple. No JVD present. Carotid bruit is not present. No thyromegaly present.  Cardiovascular: Normal rate, regular rhythm and normal heart sounds.   No murmur heard. Pulmonary/Chest: Effort normal and breath sounds normal. No respiratory distress. She has no wheezes. She has no rales. She exhibits no tenderness.  Musculoskeletal: She exhibits no edema.  Neurological: She is alert and oriented to person, place, and time.  Psychiatric: She has a normal mood and affect.  Nursing note and vitals reviewed.  BP 126/74 (BP Location: Left Arm, Patient Position: Sitting, Cuff Size: Large)   Pulse (!) 109   Temp 98.2 F (36.8 C) (Oral)   Ht 5\' 6"  (1.676 m)   Wt 224 lb 9.6 oz (101.9 kg)   SpO2 97%   BMI 36.25 kg/m  Wt  Readings from Last 3 Encounters:  11/21/16 224 lb 9.6 oz (101.9 kg)  04/19/16 226 lb 3.2 oz (102.6 kg)  04/10/16 230 lb (104.3 kg)     Lab Results  Component Value Date   WBC 11.8 (H) 04/19/2016   HGB 11.4 (L) 04/19/2016   HCT 36.4 04/19/2016   PLT 345 04/19/2016   GLUCOSE 88 04/19/2016   CHOL 228 (H)  07/07/2014   TRIG 89.0 07/07/2014   HDL 68.20 07/07/2014   LDLDIRECT 116.6 03/14/2011   LDLCALC 142 (H) 07/07/2014   ALT 16 04/19/2016   AST 15 04/19/2016   NA 138 04/19/2016   K 3.7 04/19/2016   CL 102 04/19/2016   CREATININE 0.70 04/19/2016   BUN 10 04/19/2016   CO2 26 04/19/2016   TSH 0.64 06/18/2016    Dg Chest 2 View  Result Date: 05/08/2016 CLINICAL DATA:  Recent diagnosis of pneumonia, followup EXAM: CHEST  2 VIEW COMPARISON:  Chest x-ray of 04/19/2016 FINDINGS: The opacities within the right lung noted previously have cleared. No pneumonia is seen currently and there is no evidence of pleural effusion. Mediastinal and hilar contours are unremarkable. The heart is within normal limits in size. No bony abnormality is seen. IMPRESSION: No active cardiopulmonary disease. Clearing of previously noted opacities in the right mid and lower lung. Electronically Signed   By: Ivar Drape M.D.   On: 05/08/2016 08:02     Assessment & Plan:  Plan  I have discontinued Ms. Delossantos's azithromycin. I am also having her start on ciprofloxacin. Additionally, I am having her maintain her cyclobenzaprine, esomeprazole, traMADol, promethazine-dextromethorphan, Albuterol Sulfate, levothyroxine, amLODipine, levothyroxine, and promethazine.  Meds ordered this encounter  Medications  . promethazine (PHENERGAN) 25 MG tablet    Sig: 1 po qid prn    Dispense:  30 tablet    Refill:  1  . ciprofloxacin (CIPRO) 250 MG tablet    Sig: Take 1 tablet (250 mg total) by mouth 2 (two) times daily.    Dispense:  6 tablet    Refill:  0    Problem List Items Addressed This Visit      Unprioritized   Abnormal urine odor - Primary    Urine culture sent       Relevant Orders   POCT urinalysis dipstick (Completed)   Urine cytology ancillary only   Urine Culture (Completed)    Other Visit Diagnoses    Nausea       Relevant Medications   promethazine (PHENERGAN) 25 MG tablet      Follow-up: Return if  symptoms worsen or fail to improve.  Ann Held, DO

## 2016-11-21 NOTE — Patient Instructions (Signed)
Dehydration, Adult Dehydration is a condition in which there is not enough fluid or water in the body. This happens when you lose more fluids than you take in. Important organs, such as the kidneys, brain, and heart, cannot function without a proper amount of fluids. Any loss of fluids from the body can lead to dehydration. Dehydration can range from mild to severe. This condition should be treated right away to prevent it from becoming severe. What are the causes? This condition may be caused by:  Vomiting.  Diarrhea.  Excessive sweating, such as from heat exposure or exercise.  Not drinking enough fluid, especially: ? When ill. ? While doing activity that requires a lot of energy.  Excessive urination.  Fever.  Infection.  Certain medicines, such as medicines that cause the body to lose excess fluid (diuretics).  Inability to access safe drinking water.  Reduced physical ability to get adequate water and food.  What increases the risk? This condition is more likely to develop in people:  Who have a poorly controlled long-term (chronic) illness, such as diabetes, heart disease, or kidney disease.  Who are age 65 or older.  Who are disabled.  Who live in a place with high altitude.  Who play endurance sports.  What are the signs or symptoms? Symptoms of mild dehydration may include:  Thirst.  Dry lips.  Slightly dry mouth.  Dry, warm skin.  Dizziness. Symptoms of moderate dehydration may include:  Very dry mouth.  Muscle cramps.  Dark urine. Urine may be the color of tea.  Decreased urine production.  Decreased tear production.  Heartbeat that is irregular or faster than normal (palpitations).  Headache.  Light-headedness, especially when you stand up from a sitting position.  Fainting (syncope). Symptoms of severe dehydration may include:  Changes in skin, such as: ? Cold and clammy skin. ? Blotchy (mottled) or pale skin. ? Skin that does  not quickly return to normal after being lightly pinched and released (poor skin turgor).  Changes in body fluids, such as: ? Extreme thirst. ? No tear production. ? Inability to sweat when body temperature is high, such as in hot weather. ? Very little urine production.  Changes in vital signs, such as: ? Weak pulse. ? Pulse that is more than 100 beats a minute when sitting still. ? Rapid breathing. ? Low blood pressure.  Other changes, such as: ? Sunken eyes. ? Cold hands and feet. ? Confusion. ? Lack of energy (lethargy). ? Difficulty waking up from sleep. ? Short-term weight loss. ? Unconsciousness. How is this diagnosed? This condition is diagnosed based on your symptoms and a physical exam. Blood and urine tests may be done to help confirm the diagnosis. How is this treated? Treatment for this condition depends on the severity. Mild or moderate dehydration can often be treated at home. Treatment should be started right away. Do not wait until dehydration becomes severe. Severe dehydration is an emergency and it needs to be treated in a hospital. Treatment for mild dehydration may include:  Drinking more fluids.  Replacing salts and minerals in your blood (electrolytes) that you may have lost. Treatment for moderate dehydration may include:  Drinking an oral rehydration solution (ORS). This is a drink that helps you replace fluids and electrolytes (rehydrate). It can be found at pharmacies and retail stores. Treatment for severe dehydration may include:  Receiving fluids through an IV tube.  Receiving an electrolyte solution through a feeding tube that is passed through your nose   and into your stomach (nasogastric tube, or NG tube).  Correcting any abnormalities in electrolytes.  Treating the underlying cause of dehydration. Follow these instructions at home:  If directed by your health care provider, drink an ORS: ? Make an ORS by following instructions on the  package. ? Start by drinking small amounts, about  cup (120 mL) every 5-10 minutes. ? Slowly increase how much you drink until you have taken the amount recommended by your health care provider.  Drink enough clear fluid to keep your urine clear or pale yellow. If you were told to drink an ORS, finish the ORS first, then start slowly drinking other clear fluids. Drink fluids such as: ? Water. Do not drink only water. Doing that can lead to having too little salt (sodium) in the body (hyponatremia). ? Ice chips. ? Fruit juice that you have added water to (diluted fruit juice). ? Low-calorie sports drinks.  Avoid: ? Alcohol. ? Drinks that contain a lot of sugar. These include high-calorie sports drinks, fruit juice that is not diluted, and soda. ? Caffeine. ? Foods that are greasy or contain a lot of fat or sugar.  Take over-the-counter and prescription medicines only as told by your health care provider.  Do not take sodium tablets. This can lead to having too much sodium in the body (hypernatremia).  Eat foods that contain a healthy balance of electrolytes, such as bananas, oranges, potatoes, tomatoes, and spinach.  Keep all follow-up visits as told by your health care provider. This is important. Contact a health care provider if:  You have abdominal pain that: ? Gets worse. ? Stays in one area (localizes).  You have a rash.  You have a stiff neck.  You are more irritable than usual.  You are sleepier or more difficult to wake up than usual.  You feel weak or dizzy.  You feel very thirsty.  You have urinated only a small amount of very dark urine over 6-8 hours. Get help right away if:  You have symptoms of severe dehydration.  You cannot drink fluids without vomiting.  Your symptoms get worse with treatment.  You have a fever.  You have a severe headache.  You have vomiting or diarrhea that: ? Gets worse. ? Does not go away.  You have blood or green matter  (bile) in your vomit.  You have blood in your stool. This may cause stool to look black and tarry.  You have not urinated in 6-8 hours.  You faint.  Your heart rate while sitting still is over 100 beats a minute.  You have trouble breathing. This information is not intended to replace advice given to you by your health care provider. Make sure you discuss any questions you have with your health care provider. Document Released: 02/18/2005 Document Revised: 09/15/2015 Document Reviewed: 04/14/2015 Elsevier Interactive Patient Education  2018 [employer] Inc.  

## 2016-11-23 ENCOUNTER — Encounter: Payer: Self-pay | Admitting: Family Medicine

## 2016-11-23 DIAGNOSIS — R8290 Unspecified abnormal findings in urine: Secondary | ICD-10-CM | POA: Insufficient documentation

## 2016-11-23 LAB — URINE CULTURE
MICRO NUMBER: 81041641
SPECIMEN QUALITY: ADEQUATE

## 2016-11-23 NOTE — Assessment & Plan Note (Signed)
Urine culture sent.

## 2016-11-25 ENCOUNTER — Ambulatory Visit
Admission: RE | Admit: 2016-11-25 | Discharge: 2016-11-25 | Disposition: A | Discharge status: Home / Self Care | Payer: 59 | Source: Ambulatory Visit | Attending: Obstetrics and Gynecology | Admitting: Obstetrics and Gynecology

## 2016-11-25 DIAGNOSIS — Z1231 Encounter for screening mammogram for malignant neoplasm of breast: Secondary | ICD-10-CM

## 2016-11-27 LAB — URINE CYTOLOGY ANCILLARY ONLY
BACTERIAL VAGINITIS: NEGATIVE
CANDIDA VAGINITIS: NEGATIVE

## 2016-12-25 ENCOUNTER — Other Ambulatory Visit: Payer: Self-pay | Admitting: Internal Medicine

## 2017-01-20 ENCOUNTER — Other Ambulatory Visit: Payer: Self-pay | Admitting: Family Medicine

## 2017-01-20 DIAGNOSIS — M25561 Pain in right knee: Secondary | ICD-10-CM

## 2017-01-21 NOTE — Telephone Encounter (Signed)
[facility]  Database is ran and is on your desk for review  Last filled per database:  01/24/16 Last written: 01/23/16 Last ov: 12/04/15 Next ov: 04/15/17 Contract: none UDS: none

## 2017-01-27 ENCOUNTER — Encounter: Payer: Self-pay | Admitting: Family Medicine

## 2017-01-28 ENCOUNTER — Other Ambulatory Visit: Payer: Self-pay | Admitting: Family Medicine

## 2017-01-28 DIAGNOSIS — N390 Urinary tract infection, site not specified: Secondary | ICD-10-CM

## 2017-01-28 MED ORDER — CIPROFLOXACIN HCL 250 MG PO TABS: 250.0000 mg | ORAL_TABLET | Freq: Two times a day (BID) | ORAL | 0 refills | Status: DC

## 2017-01-28 NOTE — Telephone Encounter (Signed)
Ok -- sent in---- she will need ov if no improvement

## 2017-03-14 ENCOUNTER — Encounter: Payer: Self-pay | Admitting: Internal Medicine

## 2017-03-14 ENCOUNTER — Ambulatory Visit (INDEPENDENT_AMBULATORY_CARE_PROVIDER_SITE_OTHER): Payer: 59 | Admitting: Internal Medicine

## 2017-03-14 VITALS — BP 122/80 | HR 86 | Ht 66.0 in | Wt 222.8 lb

## 2017-03-14 DIAGNOSIS — E039 Hypothyroidism, unspecified: Secondary | ICD-10-CM

## 2017-03-14 LAB — T4, FREE: Free T4: 1.29 ng/dL (ref 0.60–1.60)

## 2017-03-14 LAB — TSH: TSH: 1.5 u[IU]/mL (ref 0.35–4.50)

## 2017-03-14 MED ORDER — LEVOTHYROXINE SODIUM 150 MCG PO TABS: 150.0000 ug | ORAL_TABLET | Freq: Every day | ORAL | 3 refills | Status: DC

## 2017-03-14 NOTE — Progress Notes (Signed)
Patient ID: [name], female   DOB: 10/23/1970, 47 y.o.   MRN: [id number]   HPI  [name] is a 47 y.o.-year-old female, returning for f/u for hypothyroidism. Last visit 1 year ago.  Reviewed history: Pt. has been dx with hypothyroidism in ~2005 >> started on Levothyroxine 112 mcg for "years", then increased up to 200 mcg.  She has a h/o noncompliance with the LT4, now taking it every day.   Currently on 150 mcg LT4 daily: - in am - fasting - at least 30-60 min from b'fast - no Ca, Fe, MV - + Occasional Nexium at least 4 hours after Synthroid - not on Biotin  I reviewed pt's thyroid tests: Lab Results  Component Value Date   TSH 0.64 06/18/2016   TSH 0.02 (L) 04/19/2016   TSH 0.04 (L) 03/15/2016   TSH 1.20 03/15/2015   TSH 0.48 11/14/2014   TSH 5.86 (H) 10/04/2014   TSH 6.63 (H) 07/07/2014   TSH 7.24 (H) 04/27/2014   TSH 16.57 (H) 02/22/2014   TSH 5.83 (H) 11/19/2013   FREET4 0.90 06/18/2016   FREET4 2.0 (H) 04/19/2016   FREET4 1.99 (H) 03/15/2016   FREET4 0.99 03/15/2015   FREET4 1.19 11/14/2014   FREET4 0.93 04/27/2014   FREET4 0.3 (L) 01/30/2006   Thyroid U/S (02/20/2006): Heterogeneous thyroid, no nodules  Pt denies: - feeling nodules in neck - hoarseness - dysphagia - choking - SOB with lying down  She has + FH of thyroid disorders in: mother (nodule). [facility] Kitchenxtgpi  She has a history of lap band in 2009.  She lost approximately 100 pounds (from 300 pounds).  ROS: Constitutional: no weight gain/no weight loss, no fatigue, no subjective hyperthermia, no subjective hypothermia Eyes: no blurry vision, no xerophthalmia ENT: no sore throat, + see HPI Cardiovascular: no CP/no SOB/no palpitations/no leg swelling Respiratory: no cough/no SOB/no wheezing Gastrointestinal: no N/no V/no D/no C/no acid reflux Musculoskeletal: no muscle aches/no joint aches Skin: no rashes, no hair loss Neurological: no tremors/no numbness/no tingling/no dizziness  I  reviewed pt's medications, allergies, PMH, social hx, family hx, and changes were documented in the history of present illness. Otherwise, unchanged from my initial visit note.  Past Medical History:  Diagnosis Date  . Contact lens/glasses fitting   . Hypertension   . Thyroid disease    Past Surgical History:  Procedure Laterality Date  . ABDOMINAL HYSTERECTOMY  01/2010   [facility]  . CESAREAN SECTION  12/2003  . LAPAROSCOPIC GASTRIC BANDING  10/12/2007  . myomectomy     2x's. Patient unsure of date of procedure based on history form dated 11/10/2009.   Social History   Social History  . Marital Status: single    Spouse Name: N/A  . Number of Children: 1   Occupational History  . 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 Smoking status: Never Smoker   . Smokeless tobacco: Never Used  . Alcohol Use: No  . Drug Use: No  . Sexual Activity:    Partners: Male   Social History Narrative   Exercising---treadmill 4x a week   Current Outpatient Medications on File Prior to Visit  Medication Sig Dispense Refill  . Albuterol Sulfate (PROAIR RESPICLICK) 086 (90 Base) MCG/ACT AEPB Inhale 2 puffs into the lungs 4 (four) times daily as needed. 1 each 0  . amLODipine (NORVASC) 10 MG tablet Take 1 tablet (10 mg total) by mouth daily. 90 tablet 3  . esomeprazole (NEXIUM) 40 MG  capsule TAKE 1 CAPSULE (40 MG TOTAL) BY MOUTH DAILY. 90 capsule 1  . levothyroxine (SYNTHROID, LEVOTHROID) 150 MCG tablet TAKE 1 TABLET BY MOUTH EVERY DAY 90 tablet 1  . levothyroxine (SYNTHROID, LEVOTHROID) 200 MCG tablet TAKE 1 TABLET (200 MCG TOTAL) BY MOUTH DAILY BEFORE BREAKFAST. 90 tablet 2  . promethazine (PHENERGAN) 25 MG tablet 1 po qid prn 30 tablet 1  . promethazine-dextromethorphan (PROMETHAZINE-DM) 6.25-15 MG/5ML syrup Take 5 mLs by mouth 4 (four) times daily as needed for cough. 118 mL 0  . traMADol (ULTRAM) 50 MG tablet TAKE 1 TABLET BY MOUTH EVERY 8 HOURS AS NEEDED 30 tablet 1   No current  facility-administered medications on file prior to visit.    Allergies  Allergen Reactions  . Diflucan [Fluconazole] Swelling    Blister and swelling in mouth  . Penicillins Rash   Family History  Problem Relation Age of Onset  . Hypertension Mother   . Thyroid disease Mother   . Hypertension Father   . Hypertension Sister   . Cancer Maternal Aunt        colon   PE: BP 122/80   Pulse 86   Ht 5\' 6"  (1.676 m)   Wt 222 lb 12.8 oz (101.1 kg)   SpO2 98%   BMI 35.96 kg/m  Body mass index is 35.96 kg/m. Wt Readings from Last 3 Encounters:  03/14/17 222 lb 12.8 oz (101.1 kg)  11/21/16 224 lb 9.6 oz (101.9 kg)  04/19/16 226 lb 3.2 oz (102.6 kg)   Constitutional: overweight, in NAD Eyes: PERRLA, EOMI, no exophthalmos ENT: moist mucous membranes, no thyromegaly, no cervical lymphadenopathy Cardiovascular: RRR, No MRG Respiratory: CTA B Gastrointestinal: abdomen soft, NT, ND, BS+ Musculoskeletal: no deformities, strength intact in all 4 Skin: moist, warm, no rashes Neurological: no tremor with outstretched hands, DTR normal in all 4  ASSESSMENT: 1. Hypothyroidism - poorly controlled - h/o noncompliance with LT4, now compliant  PLAN:  1. Patient with long-standing hypothyroidism, on levothyroxine therapy, with previous noncompliance with the medication, but more recently more controlled disease due to taking the medication regularly.  We had to decrease the dose significantly after she started to take it correctly. - reviewed her last thyroid U/S report >> no nodules. Thyroid is not palpable. - latest thyroid labs reviewed with pt >> normal  - she continues on LT4 150 mcg daily - pt feels good on this dose, but lately she feels the dose may be too high >> slight fatigue - we discussed about taking the thyroid hormone every day, with water, >30 minutes before breakfast, separated by >4 hours from acid reflux medications, calcium, iron, multivitamins. Pt. is taking it  correctly - will check thyroid tests today: TSH and fT4 - If labs are abnormal, she will need to return for repeat TFTs in 1.5 months - OTW, RTC in 1 year  Needs a refill.  Component     Latest Ref Rng & Units 03/14/2017  TSH     0.35 - 4.50 uIU/mL 1.50  T4,Free(Direct)     0.60 - 1.60 ng/dL 1.29  TFTs are great.  Philemon Kingdom, MD PhD [facility]

## 2017-03-14 NOTE — Patient Instructions (Addendum)
Please stop at the lab.  Please continue Synthroid 150 mcg daily.  Take the thyroid hormone every day, with water, at least 30 minutes before breakfast, separated by at least 4 hours from: - acid reflux medications - calcium - iron - multivitamins  Please come back for a follow-up appointment in 1 year.

## 2017-04-15 ENCOUNTER — Ambulatory Visit (INDEPENDENT_AMBULATORY_CARE_PROVIDER_SITE_OTHER): Payer: 59 | Admitting: Family Medicine

## 2017-04-15 ENCOUNTER — Encounter: Payer: Self-pay | Admitting: Family Medicine

## 2017-04-15 VITALS — BP 132/88 | HR 80 | Temp 98.5°F | Resp 16 | Ht 66.0 in | Wt 225.0 lb

## 2017-04-15 DIAGNOSIS — Z79899 Other long term (current) drug therapy: Secondary | ICD-10-CM

## 2017-04-15 DIAGNOSIS — Z0000 Encounter for general adult medical examination without abnormal findings: Secondary | ICD-10-CM

## 2017-04-15 DIAGNOSIS — M25561 Pain in right knee: Secondary | ICD-10-CM

## 2017-04-15 DIAGNOSIS — I10 Essential (primary) hypertension: Secondary | ICD-10-CM | POA: Diagnosis not present

## 2017-04-15 DIAGNOSIS — R110 Nausea: Secondary | ICD-10-CM | POA: Diagnosis not present

## 2017-04-15 LAB — CBC WITH DIFFERENTIAL/PLATELET
BASOS ABS: 0 10*3/uL (ref 0.0–0.1)
Basophils Relative: 0.4 % (ref 0.0–3.0)
EOS ABS: 0.1 10*3/uL (ref 0.0–0.7)
EOS PCT: 3.1 % (ref 0.0–5.0)
HCT: 35.8 % — ABNORMAL LOW (ref 36.0–46.0)
Hemoglobin: 11.4 g/dL — ABNORMAL LOW (ref 12.0–15.0)
Lymphocytes Relative: 48.8 % — ABNORMAL HIGH (ref 12.0–46.0)
Lymphs Abs: 2.2 10*3/uL (ref 0.7–4.0)
MCHC: 31.7 g/dL (ref 30.0–36.0)
MCV: 84.1 fl (ref 78.0–100.0)
MONO ABS: 0.3 10*3/uL (ref 0.1–1.0)
Monocytes Relative: 7.9 % (ref 3.0–12.0)
Neutro Abs: 1.8 10*3/uL (ref 1.4–7.7)
Neutrophils Relative %: 39.8 % — ABNORMAL LOW (ref 43.0–77.0)
Platelets: 310 10*3/uL (ref 150.0–400.0)
RBC: 4.26 Mil/uL (ref 3.87–5.11)
RDW: 15.4 % (ref 11.5–15.5)
WBC: 4.4 10*3/uL (ref 4.0–10.5)

## 2017-04-15 LAB — COMPREHENSIVE METABOLIC PANEL
ALBUMIN: 4 g/dL (ref 3.5–5.2)
ALK PHOS: 106 U/L (ref 39–117)
ALT: 11 U/L (ref 0–35)
AST: 15 U/L (ref 0–37)
BUN: 14 mg/dL (ref 6–23)
CO2: 31 mEq/L (ref 19–32)
CREATININE: 0.75 mg/dL (ref 0.40–1.20)
Calcium: 9.5 mg/dL (ref 8.4–10.5)
Chloride: 103 mEq/L (ref 96–112)
GFR: 107.25 mL/min (ref >60.00)
GLUCOSE: 88 mg/dL (ref 70–99)
Potassium: 4.1 mEq/L (ref 3.5–5.1)
SODIUM: 141 meq/L (ref 135–145)
TOTAL PROTEIN: 7.4 g/dL (ref 6.0–8.3)
Total Bilirubin: 0.5 mg/dL (ref 0.2–1.2)

## 2017-04-15 LAB — TSH: TSH: 3.03 u[IU]/mL (ref 0.35–4.50)

## 2017-04-15 LAB — LIPID PANEL
CHOLESTEROL: 215 mg/dL — AB (ref 0–200)
HDL: 68.6 mg/dL (ref >39.00)
LDL Cholesterol: 129 mg/dL — ABNORMAL HIGH (ref 0–99)
NONHDL: 146.53
Total CHOL/HDL Ratio: 3
Triglycerides: 90 mg/dL (ref 0.0–149.0)
VLDL: 18 mg/dL (ref 0.0–40.0)

## 2017-04-15 MED ORDER — PROMETHAZINE HCL 25 MG PO TABS: ORAL_TABLET | ORAL | 1 refills | Status: DC

## 2017-04-15 MED ORDER — AMLODIPINE BESYLATE 10 MG PO TABS: 10.0000 mg | ORAL_TABLET | Freq: Every day | ORAL | 3 refills | Status: DC

## 2017-04-15 MED ORDER — TRAMADOL HCL 50 MG PO TABS: 50.0000 mg | ORAL_TABLET | Freq: Three times a day (TID) | ORAL | 1 refills | Status: DC | PRN

## 2017-04-15 NOTE — Patient Instructions (Signed)
Preventive Care 18-39 Years, Female Preventive care refers to lifestyle choices and visits with your health care provider that can promote health and wellness. What does preventive care include?  A yearly physical exam. This is also called an annual well check.  Dental exams once or twice a year.  Routine eye exams. Ask your health care provider how often you should have your eyes checked.  Personal lifestyle choices, including: ? Daily care of your teeth and gums. ? Regular physical activity. ? Eating a healthy diet. ? Avoiding tobacco and drug use. ? Limiting alcohol use. ? Practicing safe sex. ? Taking vitamin and mineral supplements as recommended by your health care provider. What happens during an annual well check? The services and screenings done by your health care provider during your annual well check will depend on your age, overall health, lifestyle risk factors, and family history of disease. Counseling Your health care provider may ask you questions about your:  Alcohol use.  Tobacco use.  Drug use.  Emotional well-being.  Home and relationship well-being.  Sexual activity.  Eating habits.  Work and work Statistician.  Method of birth control.  Menstrual cycle.  Pregnancy history.  Screening You may have the following tests or measurements:  Height, weight, and BMI.  Diabetes screening. This is done by checking your blood sugar (glucose) after you have not eaten for a while (fasting).  Blood pressure.  Lipid and cholesterol levels. These may be checked every 5 years starting at age 66.  Skin check.  Hepatitis C blood test.  Hepatitis B blood test.  Sexually transmitted disease (STD) testing.  BRCA-related cancer screening. This may be done if you have a family history of breast, ovarian, tubal, or peritoneal cancers.  Pelvic exam and Pap test. This may be done every 3 years starting at age 40. Starting at age 59, this may be done every 5  years if you have a Pap test in combination with an HPV test.  Discuss your test results, treatment options, and if necessary, the need for more tests with your health care provider. Vaccines Your health care provider may recommend certain vaccines, such as:  Influenza vaccine. This is recommended every year.  Tetanus, diphtheria, and acellular pertussis (Tdap, Td) vaccine. You may need a Td booster every 10 years.  Varicella vaccine. You may need this if you have not been vaccinated.  HPV vaccine. If you are 69 or younger, you may need three doses over 6 months.  Measles, mumps, and rubella (MMR) vaccine. You may need at least one dose of MMR. You may also need a second dose.  Pneumococcal 13-valent conjugate (PCV13) vaccine. You may need this if you have certain conditions and were not previously vaccinated.  Pneumococcal polysaccharide (PPSV23) vaccine. You may need one or two doses if you smoke cigarettes or if you have certain conditions.  Meningococcal vaccine. One dose is recommended if you are age 27-21 years and a first-year college student living in a residence hall, or if you have one of several medical conditions. You may also need additional booster doses.  Hepatitis A vaccine. You may need this if you have certain conditions or if you travel or work in places where you may be exposed to hepatitis A.  Hepatitis B vaccine. You may need this if you have certain conditions or if you travel or work in places where you may be exposed to hepatitis B.  Haemophilus influenzae type b (Hib) vaccine. You may need this if  you have certain risk factors.  Talk to your health care provider about which screenings and vaccines you need and how often you need them. This information is not intended to replace advice given to you by your health care provider. Make sure you discuss any questions you have with your health care provider. Document Released: 04/16/2001 Document Revised: 11/08/2015  Document Reviewed: 12/20/2014 Elsevier Interactive Patient Education  [employer].

## 2017-04-15 NOTE — Progress Notes (Signed)
Subjective:   I acted as a Education administrator for Dr. Carollee Herter.  Guerry Bruin, CMA   [name] is a 47 y.o. female and is here for a comprehensive physical exam. The patient reports no problems.  Social History   Socioeconomic History  . Marital status: Divorced    Spouse name: Not on file  . Number of children: Not on file  . Years of education: Not on file  . Highest education level: Not on file  Social Needs  . Financial resource strain: Not on file  . Food insecurity - worry: Not on file  . Food insecurity - inability: Not on file  . Transportation needs - medical: Not on file  . Transportation needs - non-medical: Not on file  Occupational History  . Not on file  Tobacco Use  . Smoking status: Never Smoker  . Smokeless tobacco: Never Used  Substance and Sexual Activity  . Alcohol use: No  . Drug use: No  . Sexual activity: Not on file  Other Topics Concern  . Not on file  Social History Narrative   Exercising---treadmill 2x a week   Health Maintenance  Topic Date Due  . HIV Screening  04/16/2023 (Originally 11/02/1986)  . PAP SMEAR  11/08/2017  . MAMMOGRAM  11/25/2017  . TETANUS/TDAP  03/13/2021  . INFLUENZA VACCINE  Completed    The following portions of the patient's history were reviewed and updated as appropriate:  She  has a past medical history of Contact lens/glasses fitting, Hypertension, and Thyroid disease. She does not have any pertinent problems on file. She  has a past surgical history that includes Laparoscopic gastric banding (10/12/2007); Cesarean section (12/2003); myomectomy; and Abdominal hysterectomy (01/2010). Her family history includes Cancer in her maternal aunt; Hypertension in her father, mother, and sister; Thyroid disease in her mother. She  reports that  has never smoked. she has never used smokeless tobacco. She reports that she does not drink alcohol or use drugs. She has a current medication list which includes the following  prescription(s): albuterol sulfate, amlodipine, esomeprazole, levothyroxine, promethazine, and tramadol. Current Outpatient Medications on File Prior to Visit  Medication Sig Dispense Refill  . Albuterol Sulfate (PROAIR RESPICLICK) 735 (90 Base) MCG/ACT AEPB Inhale 2 puffs into the lungs 4 (four) times daily as needed. 1 each 0  . esomeprazole (NEXIUM) 40 MG capsule TAKE 1 CAPSULE (40 MG TOTAL) BY MOUTH DAILY. 90 capsule 1  . levothyroxine (SYNTHROID, LEVOTHROID) 150 MCG tablet Take 1 tablet (150 mcg total) by mouth daily. 90 tablet 3   No current facility-administered medications on file prior to visit.    She is allergic to diflucan [fluconazole] and penicillins..  Review of Systems Review of Systems  Constitutional: Negative for activity change, appetite change and fatigue.  HENT: Negative for hearing loss, congestion, tinnitus and ear discharge.  dentist q57m Eyes: Negative for visual disturbance (see optho q1y -- vision corrected to 20/20 with glasses).  Respiratory: Negative for cough, chest tightness and shortness of breath.   Cardiovascular: Negative for chest pain, palpitations and leg swelling.  Gastrointestinal: Negative for abdominal pain, diarrhea, constipation and abdominal distention.  Genitourinary: Negative for urgency, frequency, decreased urine volume and difficulty urinating.  Musculoskeletal: Negative for back pain, arthralgias and gait problem.  Skin: Negative for color change, pallor and rash.  Neurological: Negative for dizziness, light-headedness, numbness and headaches.  Hematological: Negative for adenopathy. Does not bruise/bleed easily.  Psychiatric/Behavioral: Negative for suicidal ideas, confusion, sleep disturbance, self-injury, dysphoric mood, decreased concentration  and agitation.      Objective:    BP 132/88 (BP Location: Right Arm, Cuff Size: Large)   Pulse 80   Temp 98.5 F (36.9 C) (Oral)   Resp 16   Ht 5\' 6"  (1.676 m)   Wt 225 lb (102.1 kg)    SpO2 98%   BMI 36.32 kg/m  General appearance: alert, cooperative, appears stated age and no distress Head: Normocephalic, without obvious abnormality, atraumatic Eyes: conjunctivae/corneas clear. PERRL, EOM's intact. Fundi benign. Ears: normal TM's and external ear canals both ears Nose: Nares normal. Septum midline. Mucosa normal. No drainage or sinus tenderness. Throat: lips, mucosa, and tongue normal; teeth and gums normal Neck: no adenopathy, no carotid bruit, no JVD, supple, symmetrical, trachea midline and thyroid not enlarged, symmetric, no tenderness/mass/nodules Back: symmetric, no curvature. ROM normal. No CVA tenderness. Lungs: clear to auscultation bilaterally Breasts: gyn Heart: regular rate and rhythm, S1, S2 normal, no murmur, click, rub or gallop Abdomen: soft, non-tender; bowel sounds normal; no masses,  no organomegaly Pelvic: deferred--gyn Extremities: extremities normal, atraumatic, no cyanosis or edema Pulses: 2+ and symmetric Skin: Skin color, texture, turgor normal. No rashes or lesions Lymph nodes: Cervical, supraclavicular, and axillary nodes normal. Neurologic: Alert and oriented X 3, normal strength and tone. Normal symmetric reflexes. Normal coordination and gait    Assessment:    Healthy female exam.      Plan:    ghm utd Check labs See After Visit Summary for Counseling Recommendations    1. Preventative health care See above - CBC with Differential/Platelet - Comprehensive metabolic panel - Lipid panel - TSH  2. Acute pain of right knee  - traMADol (ULTRAM) 50 MG tablet; Take 1 tablet (50 mg total) by mouth every 8 (eight) hours as needed.  Dispense: 30 tablet; Refill: 1 - Pain Mgmt, Profile 8 w/Conf, U - Pain Mgmt, Tramadol w/medMATCH, U  3. Nausea  - promethazine (PHENERGAN) 25 MG tablet; 1 po qid prn  Dispense: 30 tablet; Refill: 1  4. Essential hypertension Well controlled, no changes to meds. Encouraged heart healthy diet such  as the DASH diet and exercise as tolerated.  - amLODipine (NORVASC) 10 MG tablet; Take 1 tablet (10 mg total) by mouth daily.  Dispense: 90 tablet; Refill: 3  5. High risk medication use Contract uds

## 2017-04-17 ENCOUNTER — Encounter: Payer: Self-pay | Admitting: Family Medicine

## 2017-04-17 LAB — PAIN MGMT, PROFILE 8 W/CONF, U
6 Acetylmorphine: NEGATIVE ng/mL (ref <10)
AMPHETAMINES: NEGATIVE ng/mL (ref <500)
Alcohol Metabolites: NEGATIVE ng/mL (ref <500)
BENZODIAZEPINES: NEGATIVE ng/mL (ref <100)
BUPRENORPHINE, URINE: NEGATIVE ng/mL (ref <5)
CREATININE: 253.3 mg/dL
Cocaine Metabolite: NEGATIVE ng/mL (ref <150)
MDMA: NEGATIVE ng/mL (ref <500)
Marijuana Metabolite: NEGATIVE ng/mL (ref <20)
Opiates: NEGATIVE ng/mL (ref <100)
Oxidant: NEGATIVE ug/mL (ref <200)
Oxycodone: NEGATIVE ng/mL (ref <100)
PH: 7.27 (ref 4.5–9.0)

## 2017-04-17 LAB — PAIN MGMT, TRAMADOL W/MEDMATCH, U
Desmethyltramadol: NEGATIVE ng/mL (ref <100)
TRAMADOL: NEGATIVE ng/mL (ref <100)

## 2017-04-17 NOTE — Telephone Encounter (Signed)
nlo

## 2017-04-17 NOTE — Telephone Encounter (Deleted)
Is her cholesterol ok.  I advised of lab work and she sent a message back saying is the anemia all that's wrong.

## 2017-12-04 ENCOUNTER — Other Ambulatory Visit: Payer: Self-pay | Admitting: Obstetrics and Gynecology

## 2017-12-04 DIAGNOSIS — Z1231 Encounter for screening mammogram for malignant neoplasm of breast: Secondary | ICD-10-CM

## 2017-12-11 IMAGING — DX DG CHEST 2V
2 series · 2 of 2 positions shown · non-contrast
Comparison: 04/22/2011

CLINICAL DATA: 45-year-old female with fever cough and headache for
multiple days. Initial encounter.

EXAM:
CHEST  2 VIEW

[chest pa]
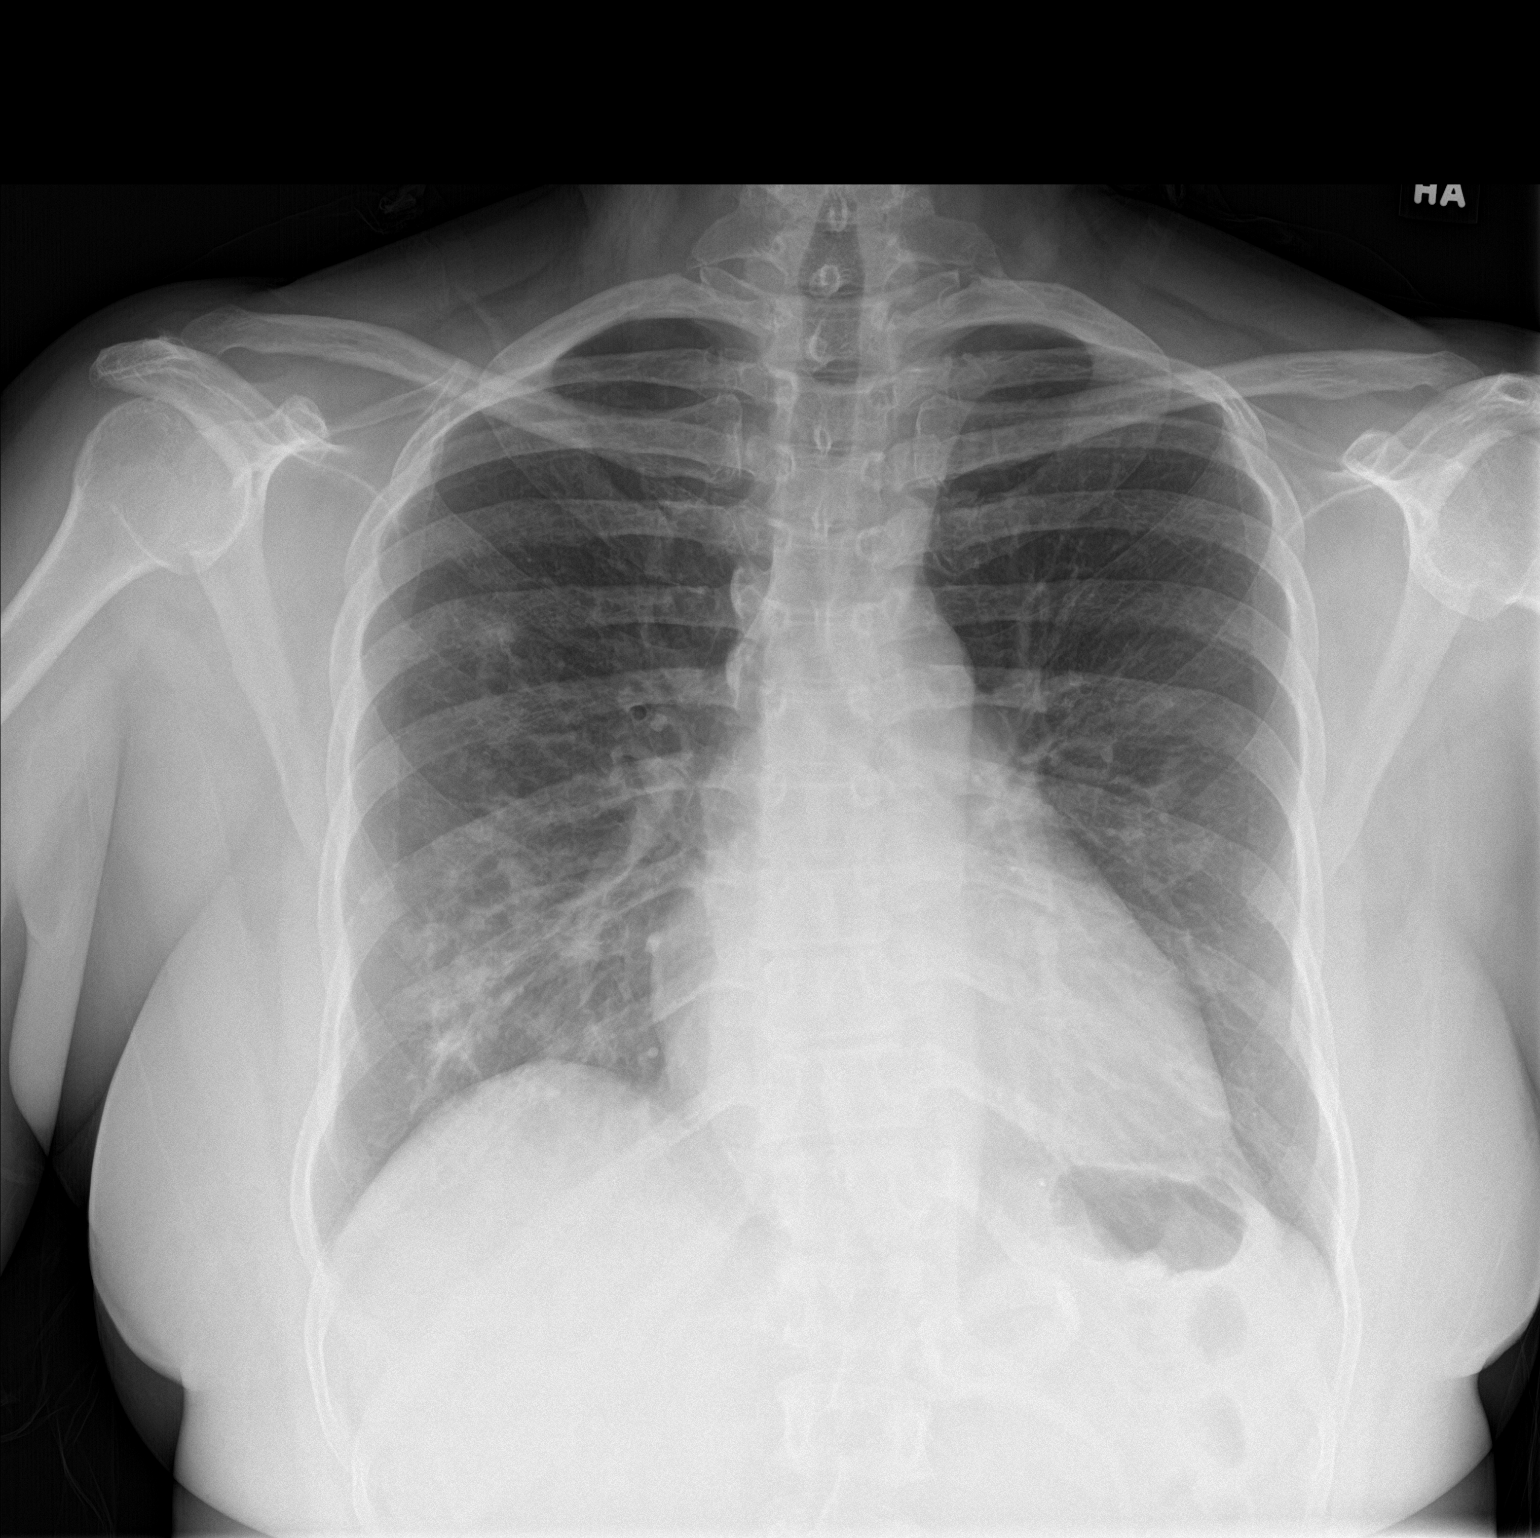

[chest lat]
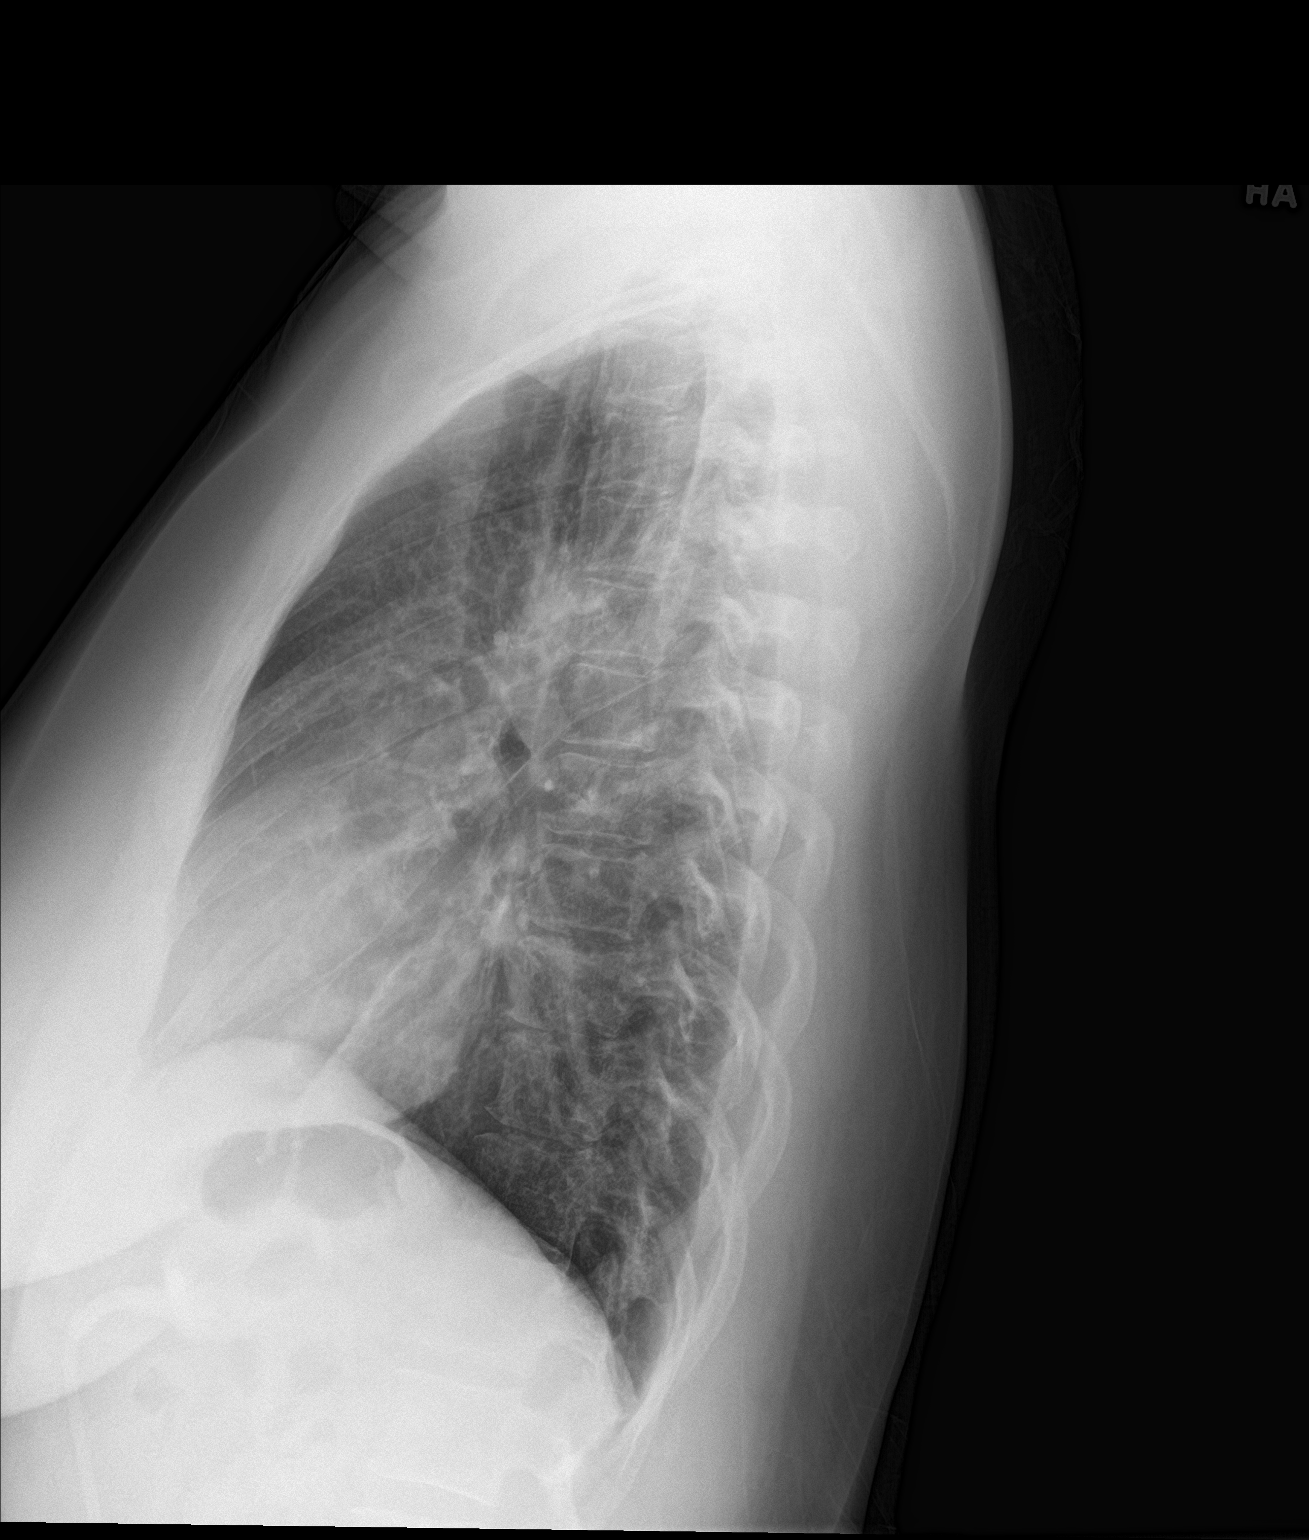

[2 of 2 positions shown; findings below may reference images not displayed]

FINDINGS: Patchy and nodular peribronchial and peripheral right lung opacity
with both upper lobe and middle lobe involvement suspected. No right
pleural effusion.

Mediastinal contours are stable, cardiac size is at the upper limits
of normal. Visualized tracheal air column is within normal limits.
No acute opacity identified in the left lung. No left pleural
effusion. No pneumothorax or pulmonary edema. Sequelae of gastric
banding. Negative visible bowel gas pattern. No acute osseous
abnormality identified.
IMPRESSION: Right multilobar bronchopneumonia/pneumonia.  No pleural effusion.

Followup PA and lateral chest X-ray is recommended in 3-4 weeks
following trial of antibiotic therapy to ensure resolution and
exclude underlying malignancy.

## 2018-01-06 ENCOUNTER — Ambulatory Visit
Admission: RE | Admit: 2018-01-06 | Discharge: 2018-01-06 | Disposition: A | Discharge status: Home / Self Care | Payer: 59 | Source: Ambulatory Visit | Attending: Obstetrics and Gynecology | Admitting: Obstetrics and Gynecology

## 2018-01-06 DIAGNOSIS — Z1231 Encounter for screening mammogram for malignant neoplasm of breast: Secondary | ICD-10-CM

## 2018-03-10 ENCOUNTER — Ambulatory Visit (INDEPENDENT_AMBULATORY_CARE_PROVIDER_SITE_OTHER): Payer: 59 | Admitting: Internal Medicine

## 2018-03-10 ENCOUNTER — Encounter: Payer: Self-pay | Admitting: Internal Medicine

## 2018-03-10 VITALS — BP 128/80 | HR 80 | Ht 66.0 in | Wt 231.0 lb

## 2018-03-10 DIAGNOSIS — E669 Obesity, unspecified: Secondary | ICD-10-CM | POA: Diagnosis not present

## 2018-03-10 DIAGNOSIS — Z833 Family history of diabetes mellitus: Secondary | ICD-10-CM

## 2018-03-10 DIAGNOSIS — E039 Hypothyroidism, unspecified: Secondary | ICD-10-CM | POA: Diagnosis not present

## 2018-03-10 LAB — HEMOGLOBIN A1C: Hgb A1c MFr Bld: 5.7 % (ref 4.6–6.5)

## 2018-03-10 NOTE — Progress Notes (Signed)
Patient ID: [name], female   DOB: 1970-08-10, 48 y.o.   MRN: [id number]   HPI  [name] is a 48 y.o.-year-old female, returning for f/u for hypothyroidism. Last visit 1 year ago  Reviewed history: Pt. has been dx with hypothyroidism in ~2005 >> started on Levothyroxine 112 mcg for "years", then increased up to 200 mcg.  She has a history of noncompliance with her levothyroxine, now taking it every day.  She is currently on levothyroxine 150 mcg daily: - in am - fasting - at least 30 min from b'fast - no Ca, Fe - + MVI and Biotin - >4h later.  She started a high dose of biotin, 10,000 mcg, last dose 2 days ago! - + PPIs-occasional Nexium more than 4 hours after levothyroxine  Reviewed her TFTs: Lab Results  Component Value Date   TSH 3.03 04/15/2017   TSH 1.50 03/14/2017   TSH 0.64 06/18/2016   TSH 0.02 (L) 04/19/2016   TSH 0.04 (L) 03/15/2016   TSH 1.20 03/15/2015   TSH 0.48 11/14/2014   TSH 5.86 (H) 10/04/2014   TSH 6.63 (H) 07/07/2014   TSH 7.24 (H) 04/27/2014   FREET4 1.29 03/14/2017   FREET4 0.90 06/18/2016   FREET4 2.0 (H) 04/19/2016   FREET4 1.99 (H) 03/15/2016   FREET4 0.99 03/15/2015   FREET4 1.19 11/14/2014   FREET4 0.93 04/27/2014   FREET4 0.3 (L) 01/30/2006   Thyroid U/S (02/20/2006): Heterogeneous thyroid, no nodules  Pt denies: - feeling nodules in neck - hoarseness - dysphagia - choking - SOB with lying down  She has + FH of thyroid disorders in: mother (nodule). No FH of thyroid cancer. No h/o radiation tx to head or neck.  No herbal supplements. No Biotin use. No recent steroids use.   She has a history of lap band in 2009.  She lost approximately 100 pounds (from 300 pounds).  + FH of DM in mother.  No results found for: HGBA1C   ROS: Constitutional: no weight gain/no weight loss, no fatigue, no subjective hyperthermia, no subjective hypothermia Eyes: no blurry vision, no xerophthalmia ENT: no sore throat, + see  HPI Cardiovascular: no CP/no SOB/no palpitations/no leg swelling Respiratory: no cough/no SOB/no wheezing Gastrointestinal: no N/no V/no D/no C/no acid reflux Musculoskeletal: no muscle aches/no joint aches Skin: no rashes, no hair loss Neurological: no tremors/no numbness/no tingling/no dizziness  I reviewed pt's medications, allergies, PMH, social hx, family hx, and changes were documented in the history of present illness. Otherwise, unchanged from my initial visit note.   Past Medical History:  Diagnosis Date  . Contact lens/glasses fitting   . Hypertension   . Thyroid disease    Past Surgical History:  Procedure Laterality Date  . ABDOMINAL HYSTERECTOMY  01/2010   [facility]  . CESAREAN SECTION  12/2003  . LAPAROSCOPIC GASTRIC BANDING  10/12/2007  . myomectomy     2x's. Patient unsure of date of procedure based on history form dated 11/10/2009.   Social History   Social History  . Marital Status: single    Spouse Name: N/A  . Number of Children: 1   Occupational History  . 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 Smoking status: Never Smoker   . Smokeless tobacco: Never Used  . Alcohol Use: No  . Drug Use: No  . Sexual Activity:    Partners: Male   Social History Narrative   Exercising---treadmill 4x a week   Current Outpatient Medications on  File Prior to Visit  Medication Sig Dispense Refill  . Albuterol Sulfate (PROAIR RESPICLICK) 536 (90 Base) MCG/ACT AEPB Inhale 2 puffs into the lungs 4 (four) times daily as needed. 1 each 0  . amLODipine (NORVASC) 10 MG tablet Take 1 tablet (10 mg total) by mouth daily. 90 tablet 3  . esomeprazole (NEXIUM) 40 MG capsule TAKE 1 CAPSULE (40 MG TOTAL) BY MOUTH DAILY. 90 capsule 1  . levothyroxine (SYNTHROID, LEVOTHROID) 150 MCG tablet Take 1 tablet (150 mcg total) by mouth daily. 90 tablet 3  . promethazine (PHENERGAN) 25 MG tablet 1 po qid prn 30 tablet 1  . traMADol (ULTRAM) 50 MG tablet Take 1 tablet (50 mg total)  by mouth every 8 (eight) hours as needed. 30 tablet 1   No current facility-administered medications on file prior to visit.    Allergies  Allergen Reactions  . Diflucan [Fluconazole] Swelling    Blister and swelling in mouth  . Penicillins Rash   Family History  Problem Relation Age of Onset  . Hypertension Mother   . Thyroid disease Mother   . Breast cancer Mother 91  . Hypertension Father   . Hypertension Sister   . Cancer Maternal Aunt        colon   PE: BP 128/80   Pulse 80   Ht 5\' 6"  (1.676 m) Comment: measured  Wt 231 lb (104.8 kg)   SpO2 (!) 80%   BMI 37.28 kg/m  Body mass index is 36.32 kg/m. Wt Readings from Last 3 Encounters:  03/10/18 231 lb (104.8 kg)  04/15/17 225 lb (102.1 kg)  03/14/17 222 lb 12.8 oz (101.1 kg)   Constitutional: overweight, in NAD Eyes: PERRLA, EOMI, no exophthalmos ENT: moist mucous membranes, no thyromegaly, no cervical lymphadenopathy Cardiovascular: RRR, No MRG Respiratory: CTA B Gastrointestinal: abdomen soft, NT, ND, BS+ Musculoskeletal: no deformities, strength intact in all 4 Skin: moist, warm, no rashes Neurological: no tremor with outstretched hands, DTR normal in all 4  ASSESSMENT: 1. Hypothyroidism - poorly controlled - h/o noncompliance with LT4, now compliant  2.  Family history of diabetes  3.  Obesity  PLAN:  1. Patient with longstanding hypothyroidism, on levothyroxine therapy, with previous noncompliance with the medication but recently more controlled disease due to taking the medication regularly.  We had to decrease the dose significantly after she started to take it correctly. -Reviewed the report of her last thyroid ultrasound: No nodules.  Thyroid is not palpable. - latest thyroid labs reviewed with pt >> normal 04/2017 - she continues on LT4 150 mcg daily - pt feels good on this dose. No significant fatigue, sluggishness, constipation, hair loss, but does have a 9 pound weight gain since last visit -  we discussed about taking the thyroid hormone every day, with water, >30 minutes before breakfast, separated by >4 hours from acid reflux medications, calcium, iron, multivitamins. Pt. is taking it correctly. - will check thyroid tests after she is off biotin for at least 2 weeks: TSH and fT4 - If labs are abnormal, she will need to return for repeat TFTs in 1.5 months - I will see her back in a year but possibly sooner for labs  2.  Family history of diabetes -in her mother -She is interested in best diet to avoid developing diabetes and we discussed about the benefits of a low-fat plant-based diet.  Also gave her references.  Explained the notion of insulin resistance and why the obsession with eating "enough proteins" and  animal proteins usually leads to increased risk of obesity and diabetes. -We will check an HbA1c per her request.  Discussed what the diagnosis of prediabetes and diabetes consists of.  She would not want to start medicines in case she does have 1 of these conditions, but she is ready to start making changes in her diet  3.  Obesity -She gained 9 pounds since last visit -Switching to a more plant-based diet will definitely help. -We will also check her TFTs to make sure she is not hypothyroid  Needs a refill of levothyroxine.  Patient Instructions  Please come back for labs in 2 weeks after stopping Biotin.  Stop Biotin 1 week before thyroid labs. You should not need more than 1000 mcg a day.  Please stop at the lab.  Please continue Synthroid 150 mcg daily.  Take the thyroid hormone every day, with water, at least 30 minutes before breakfast, separated by at least 4 hours from: - acid reflux medications - calcium - iron - multivitamins  Please come back for a follow-up appointment in 1 year.  Read the following books: Dr. Alyssa Grove - Program for Reversing Diabetes Rip Cyd Silence - The [employer] 2 diet Dr. Alden Benjamin - How Not to [facility] over Kn[place]  documentary   Philemon Kingdom, MD PhD [facility]

## 2018-03-10 NOTE — Patient Instructions (Addendum)
Please come back for labs in 2 weeks after stopping Biotin.  Stop Biotin 1 week before thyroid labs. You should not need more than 1000 mcg a day.  Please stop at the lab.  Please continue Synthroid 150 mcg daily.  Take the thyroid hormone every day, with water, at least 30 minutes before breakfast, separated by at least 4 hours from: - acid reflux medications - calcium - iron - multivitamins  Please come back for a follow-up appointment in 1 year.  Read the following books: Dr. Alyssa Grove - Program for Reversing Diabetes Rip Cyd Silence - The [employer] 2 diet Dr. Alden Benjamin - How Not to [facility] over Knives documentary

## 2018-03-24 ENCOUNTER — Other Ambulatory Visit: Payer: 59

## 2018-04-06 ENCOUNTER — Other Ambulatory Visit (INDEPENDENT_AMBULATORY_CARE_PROVIDER_SITE_OTHER): Payer: 59

## 2018-04-06 DIAGNOSIS — E039 Hypothyroidism, unspecified: Secondary | ICD-10-CM

## 2018-04-06 LAB — T4, FREE: FREE T4: 1.28 ng/dL (ref 0.60–1.60)

## 2018-04-06 LAB — TSH: TSH: 0.91 u[IU]/mL (ref 0.35–4.50)

## 2018-05-08 ENCOUNTER — Other Ambulatory Visit: Payer: Self-pay | Admitting: Internal Medicine

## 2018-06-24 ENCOUNTER — Encounter: Payer: Self-pay | Admitting: Family Medicine

## 2018-06-30 ENCOUNTER — Ambulatory Visit (INDEPENDENT_AMBULATORY_CARE_PROVIDER_SITE_OTHER): Payer: 59 | Admitting: Family Medicine

## 2018-06-30 ENCOUNTER — Other Ambulatory Visit: Payer: Self-pay | Admitting: Family Medicine

## 2018-06-30 ENCOUNTER — Encounter: Payer: Self-pay | Admitting: Family Medicine

## 2018-06-30 DIAGNOSIS — M25561 Pain in right knee: Secondary | ICD-10-CM

## 2018-06-30 DIAGNOSIS — R110 Nausea: Secondary | ICD-10-CM

## 2018-06-30 DIAGNOSIS — K219 Gastro-esophageal reflux disease without esophagitis: Secondary | ICD-10-CM

## 2018-06-30 MED ORDER — OMEPRAZOLE 20 MG PO CPDR: 20.0000 mg | DELAYED_RELEASE_CAPSULE | Freq: Every day | ORAL | 3 refills | Status: DC

## 2018-06-30 NOTE — Progress Notes (Signed)
Virtual Visit via Video Note  I connected with [name] on 06/30/18 at  2:15 PM EDT by a video enabled telemedicine application and verified that I am speaking with the correct person using two identifiers.   I discussed the limitations of evaluation and management by telemedicine and the availability of in person appointments. The patient expressed understanding and agreed to proceed.  History of Present Illness: Pt is home and needs refills  Pt has no complaints.     Observations/Objective: No vitals available\ Pt is in NAd   Assessment and Plan:  1. Gastroesophageal reflux disease, esophagitis presence not specified Stable Refill meds  - omeprazole (PRILOSEC) 20 MG capsule; Take 1 capsule (20 mg total) by mouth daily.  Dispense: 90 capsule; Refill: 3  2. Acute pain of right knee Refill tramadol for prn     Follow Up Instructions:    I discussed the assessment and treatment plan with the patient. The patient was provided an opportunity to ask questions and all were answered. The patient agreed with the plan and demonstrated an understanding of the instructions.   The patient was advised to call back or seek an in-person evaluation if the symptoms worsen or if the condition fails to improve as anticipated.  I provided 15 minutes of non-face-to-face time during this encounter.   Ann Held, DO

## 2018-07-03 ENCOUNTER — Encounter: Payer: 59 | Admitting: Family Medicine

## 2018-07-03 ENCOUNTER — Other Ambulatory Visit: Payer: Self-pay | Admitting: Family Medicine

## 2018-07-03 ENCOUNTER — Telehealth: Payer: Self-pay

## 2018-07-03 DIAGNOSIS — M25561 Pain in right knee: Secondary | ICD-10-CM

## 2018-07-03 MED ORDER — PROMETHAZINE HCL 25 MG PO TABS: 25.0000 mg | ORAL_TABLET | Freq: Four times a day (QID) | ORAL | 1 refills | Status: DC | PRN

## 2018-07-03 MED ORDER — TRAMADOL HCL 50 MG PO TABS: 50.0000 mg | ORAL_TABLET | Freq: Three times a day (TID) | ORAL | 1 refills | Status: DC | PRN

## 2018-07-03 NOTE — Telephone Encounter (Signed)
PA initiated via Covermymeds; KEY: [id number]. Awaiting determination.

## 2018-07-03 NOTE — Telephone Encounter (Signed)
Were you sending in tramadol and promethazine?

## 2018-07-03 NOTE — Telephone Encounter (Signed)
PA approved.

## 2018-07-03 NOTE — Telephone Encounter (Signed)
done

## 2018-08-21 ENCOUNTER — Encounter: Payer: Self-pay | Admitting: Family Medicine

## 2018-08-21 DIAGNOSIS — I10 Essential (primary) hypertension: Secondary | ICD-10-CM

## 2018-08-21 MED ORDER — AMLODIPINE BESYLATE 10 MG PO TABS: 10.0000 mg | ORAL_TABLET | Freq: Every day | ORAL | 1 refills | Status: DC

## 2018-09-25 ENCOUNTER — Encounter: Payer: Self-pay | Admitting: Family Medicine

## 2018-09-25 ENCOUNTER — Ambulatory Visit (INDEPENDENT_AMBULATORY_CARE_PROVIDER_SITE_OTHER): Payer: 59 | Admitting: Family Medicine

## 2018-09-25 ENCOUNTER — Encounter

## 2018-09-25 ENCOUNTER — Other Ambulatory Visit: Payer: Self-pay

## 2018-09-25 VITALS — BP 142/99 | HR 84 | Temp 98.5°F | Resp 18 | Ht 66.0 in | Wt 226.8 lb

## 2018-09-25 DIAGNOSIS — M25561 Pain in right knee: Secondary | ICD-10-CM

## 2018-09-25 DIAGNOSIS — Z0000 Encounter for general adult medical examination without abnormal findings: Secondary | ICD-10-CM

## 2018-09-25 DIAGNOSIS — K219 Gastro-esophageal reflux disease without esophagitis: Secondary | ICD-10-CM | POA: Diagnosis not present

## 2018-09-25 DIAGNOSIS — E039 Hypothyroidism, unspecified: Secondary | ICD-10-CM | POA: Diagnosis not present

## 2018-09-25 DIAGNOSIS — I10 Essential (primary) hypertension: Secondary | ICD-10-CM

## 2018-09-25 LAB — LIPID PANEL
Cholesterol: 287 mg/dL — ABNORMAL HIGH (ref 0–200)
HDL: 82.2 mg/dL (ref >39.00)
LDL Cholesterol: 187 mg/dL — ABNORMAL HIGH (ref 0–99)
NonHDL: 204.62
Total CHOL/HDL Ratio: 3
Triglycerides: 86 mg/dL (ref 0.0–149.0)
VLDL: 17.2 mg/dL (ref 0.0–40.0)

## 2018-09-25 LAB — CBC WITH DIFFERENTIAL/PLATELET
Basophils Absolute: 0 10*3/uL (ref 0.0–0.1)
Basophils Relative: 0.6 % (ref 0.0–3.0)
Eosinophils Absolute: 0.1 10*3/uL (ref 0.0–0.7)
Eosinophils Relative: 3.3 % (ref 0.0–5.0)
HCT: 36.7 % (ref 36.0–46.0)
Hemoglobin: 12 g/dL (ref 12.0–15.0)
Lymphocytes Relative: 39.2 % (ref 12.0–46.0)
Lymphs Abs: 1.5 10*3/uL (ref 0.7–4.0)
MCHC: 32.7 g/dL (ref 30.0–36.0)
MCV: 87.8 fl (ref 78.0–100.0)
Monocytes Absolute: 0.3 10*3/uL (ref 0.1–1.0)
Monocytes Relative: 7.4 % (ref 3.0–12.0)
Neutro Abs: 1.9 10*3/uL (ref 1.4–7.7)
Neutrophils Relative %: 49.5 % (ref 43.0–77.0)
Platelets: 270 10*3/uL (ref 150.0–400.0)
RBC: 4.19 Mil/uL (ref 3.87–5.11)
RDW: 15.2 % (ref 11.5–15.5)
WBC: 3.7 10*3/uL — ABNORMAL LOW (ref 4.0–10.5)

## 2018-09-25 LAB — COMPREHENSIVE METABOLIC PANEL
ALT: 10 U/L (ref 0–35)
AST: 14 U/L (ref 0–37)
Albumin: 4.2 g/dL (ref 3.5–5.2)
Alkaline Phosphatase: 85 U/L (ref 39–117)
BUN: 12 mg/dL (ref 6–23)
CO2: 31 mEq/L (ref 19–32)
Calcium: 9.7 mg/dL (ref 8.4–10.5)
Chloride: 103 mEq/L (ref 96–112)
Creatinine, Ser: 0.83 mg/dL (ref 0.40–1.20)
GFR: 89.2 mL/min (ref >60.00)
Glucose, Bld: 77 mg/dL (ref 70–99)
Potassium: 3.8 mEq/L (ref 3.5–5.1)
Sodium: 141 mEq/L (ref 135–145)
Total Bilirubin: 0.6 mg/dL (ref 0.2–1.2)
Total Protein: 7 g/dL (ref 6.0–8.3)

## 2018-09-25 LAB — TSH: TSH: 52.63 u[IU]/mL — ABNORMAL HIGH (ref 0.35–4.50)

## 2018-09-25 MED ORDER — AMLODIPINE BESYLATE 10 MG PO TABS: 10.0000 mg | ORAL_TABLET | Freq: Every day | ORAL | 1 refills | Status: DC

## 2018-09-25 MED ORDER — HYDROCHLOROTHIAZIDE 25 MG PO TABS: 25.0000 mg | ORAL_TABLET | Freq: Every day | ORAL | 3 refills | Status: DC

## 2018-09-25 MED ORDER — OMEPRAZOLE 20 MG PO CPDR
20.0000 mg | DELAYED_RELEASE_CAPSULE | Freq: Every day | ORAL | 3 refills | Status: DC
Start: 2018-09-25 — End: 2019-12-13

## 2018-09-25 MED ORDER — TRAMADOL HCL 50 MG PO TABS: 50.0000 mg | ORAL_TABLET | Freq: Three times a day (TID) | ORAL | 1 refills | Status: DC | PRN

## 2018-09-25 MED ORDER — PROMETHAZINE HCL 25 MG PO TABS: 25.0000 mg | ORAL_TABLET | Freq: Four times a day (QID) | ORAL | 1 refills | Status: DC | PRN

## 2018-09-25 MED ORDER — LEVOTHYROXINE SODIUM 150 MCG PO TABS: 150.0000 ug | ORAL_TABLET | Freq: Every day | ORAL | 3 refills | Status: DC

## 2018-09-25 NOTE — Progress Notes (Signed)
Subjective:     [name] is a 48 y.o. female and is here for a comprehensive physical exam. The patient reports no problems.  Social History   Socioeconomic History  . Marital status: Divorced    Spouse name: Not on file  . Number of children: Not on file  . Years of education: Not on file  . Highest education level: Not on file  Occupational History  . Not on file  Social Needs  . Financial resource strain: Not on file  . Food insecurity    Worry: Not on file    Inability: Not on file  . Transportation needs    Medical: Not on file    Non-medical: Not on file  Tobacco Use  . Smoking status: Never Smoker  . Smokeless tobacco: Never Used  Substance and Sexual Activity  . Alcohol use: No  . Drug use: No  . Sexual activity: Not on file  Lifestyle  . Physical activity    Days per week: Not on file    Minutes per session: Not on file  . Stress: Not on file  Relationships  . Social Herbalist on phone: Not on file    Gets together: Not on file    Attends religious service: Not on file    Active member of club or organization: Not on file    Attends meetings of clubs or organizations: Not on file    Relationship status: Not on file  . Intimate partner violence    Fear of current or ex partner: Not on file    Emotionally abused: Not on file    Physically abused: Not on file    Forced sexual activity: Not on file  Other Topics Concern  . Not on file  Social History Narrative   Exercising---treadmill 2x a week   Health Maintenance  Topic Date Due  . HIV Screening  04/16/2023 (Originally 11/02/1986)  . INFLUENZA VACCINE  10/03/2018  . MAMMOGRAM  01/07/2019  . TETANUS/TDAP  03/13/2021  . PAP SMEAR-Modifier  04/06/2021    The following portions of the patient's history were reviewed and updated as appropriate:  She  has a past medical history of Contact lens/glasses fitting, Hypertension, and Thyroid disease. She does not have any pertinent problems  on file. She  has a past surgical history that includes Laparoscopic gastric banding (10/12/2007); Cesarean section (12/2003); myomectomy; and Abdominal hysterectomy (01/2010). Her family history includes Breast cancer (age of onset: 39) in her mother; Cancer in her maternal aunt; Hypertension in her father, mother, and sister; Thyroid disease in her mother. She  reports that she has never smoked. She has never used smokeless tobacco. She reports that she does not drink alcohol or use drugs. She has a current medication list which includes the following prescription(s): amlodipine, levothyroxine, omeprazole, promethazine, tramadol, and albuterol sulfate. Current Outpatient Medications on File Prior to Visit  Medication Sig Dispense Refill  . Albuterol Sulfate (PROAIR RESPICLICK) 938 (90 Base) MCG/ACT AEPB Inhale 2 puffs into the lungs 4 (four) times daily as needed. (Patient not taking: Reported on 09/25/2018) 1 each 0   No current facility-administered medications on file prior to visit.    She is allergic to diflucan [fluconazole] and penicillins..  Review of Systems Review of Systems  Constitutional: Negative for activity change, appetite change and fatigue.  HENT: Negative for hearing loss, congestion, tinnitus and ear discharge.  dentist q85m Eyes: Negative for visual disturbance (see optho q1y -- vision  corrected to 20/20 with glasses).  Respiratory: Negative for cough, chest tightness and shortness of breath.   Cardiovascular: Negative for chest pain, palpitations and leg swelling.  Gastrointestinal: Negative for abdominal pain, diarrhea, constipation and abdominal distention.  Genitourinary: Negative for urgency, frequency, decreased urine volume and difficulty urinating.  Musculoskeletal: Negative for back pain, arthralgias and gait problem.  Skin: Negative for color change, pallor and rash.  Neurological: Negative for dizziness, light-headedness, numbness and headaches.   Hematological: Negative for adenopathy. Does not bruise/bleed easily.  Psychiatric/Behavioral: Negative for suicidal ideas, confusion, sleep disturbance, self-injury, dysphoric mood, decreased concentration and agitation.       Objective:    BP (!) 142/99 (BP Location: Left Arm, Patient Position: Sitting, Cuff Size: Normal)   Pulse 84   Temp 98.5 F (36.9 C) (Oral)   Resp 18   Ht 5\' 6"  (1.676 m)   Wt 226 lb 12.8 oz (102.9 kg)   SpO2 100%   BMI 36.61 kg/m  General appearance: alert, cooperative, appears stated age and no distress Head: Normocephalic, without obvious abnormality, atraumatic Eyes: conjunctivae/corneas clear. PERRL, EOM's intact. Fundi benign. Ears: normal TM's and external ear canals both ears Nose: Nares normal. Septum midline. Mucosa normal. No drainage or sinus tenderness. Throat: lips, mucosa, and tongue normal; teeth and gums normal Neck: no adenopathy, no carotid bruit, no JVD, supple, symmetrical, trachea midline and thyroid not enlarged, symmetric, no tenderness/mass/nodules Back: symmetric, no curvature. ROM normal. No CVA tenderness. Lungs: clear to auscultation bilaterally Breasts: gyn Heart: regular rate and rhythm, S1, S2 normal, no murmur, click, rub or gallop Abdomen: soft, non-tender; bowel sounds normal; no masses,  no organomegaly Pelvic: deferred Extremities: extremities normal, atraumatic, no cyanosis or edema Pulses: 2+ and symmetric Skin: Skin color, texture, turgor normal. No rashes or lesions Lymph nodes: Cervical, supraclavicular, and axillary nodes normal. Neurologic: Alert and oriented X 3, normal strength and tone. Normal symmetric reflexes. Normal coordination and gait    Assessment:    Healthy female exam.      Plan:  ghm utd Check labs    See After Visit Summary for Counseling Recommendations    1. Essential hypertension Well controlled, no changes to meds. Encouraged heart healthy diet such as the DASH diet and exercise  as tolerated.   - amLODipine (NORVASC) 10 MG tablet; Take 1 tablet (10 mg total) by mouth daily.  Dispense: 90 tablet; Refill: 1 - CBC with Differential/Platelet - Comprehensive metabolic panel - hydrochlorothiazide (HYDRODIURIL) 25 MG tablet; Take 1 tablet (25 mg total) by mouth daily.  Dispense: 90 tablet; Refill: 3  2. Gastroesophageal reflux disease, esophagitis presence not specified stable - omeprazole (PRILOSEC) 20 MG capsule; Take 1 capsule (20 mg total) by mouth daily.  Dispense: 90 capsule; Refill: 3  3. Acute pain of right knee stable - promethazine (PHENERGAN) 25 MG tablet; Take 1 tablet (25 mg total) by mouth every 6 (six) hours as needed for nausea.  Dispense: 30 tablet; Refill: 1 - traMADol (ULTRAM) 50 MG tablet; Take 1 tablet (50 mg total) by mouth every 8 (eight) hours as needed.  Dispense: 30 tablet; Refill: 1  4. Preventative health care See above  - Lipid panel - TSH - CBC with Differential/Platelet - Comprehensive metabolic panel - Ambulatory referral to Gastroenterology  5. Hypothyroidism, unspecified type Check labs  con't meds - TSH

## 2018-09-25 NOTE — Patient Instructions (Signed)

## 2018-12-24 ENCOUNTER — Other Ambulatory Visit: Payer: Self-pay | Admitting: Obstetrics and Gynecology

## 2018-12-24 DIAGNOSIS — Z1231 Encounter for screening mammogram for malignant neoplasm of breast: Secondary | ICD-10-CM

## 2019-01-08 ENCOUNTER — Telehealth: Payer: Self-pay | Admitting: *Deleted

## 2019-01-08 NOTE — Telephone Encounter (Signed)
Copied from [place] [id number]. Topic: General - Inquiry >> Jan 07, 2019 11:20 AM Sheran Luz wrote: Patient calling to schedule lab appointment- no orders in system. She states she was advised at last OV that she would need repeat labs.

## 2019-01-11 ENCOUNTER — Other Ambulatory Visit: Payer: Self-pay | Admitting: Family Medicine

## 2019-01-11 DIAGNOSIS — E039 Hypothyroidism, unspecified: Secondary | ICD-10-CM

## 2019-01-11 DIAGNOSIS — I10 Essential (primary) hypertension: Secondary | ICD-10-CM

## 2019-01-11 NOTE — Telephone Encounter (Signed)
Future lab orders have been placed. Please schedule whenever patient has time.

## 2019-01-11 NOTE — Telephone Encounter (Signed)
Did you want just the TSH checked? Pt was advised for 6 month lab follow up. Please advise.

## 2019-01-11 NOTE — Telephone Encounter (Signed)
Orders per last labs--- she was supposed to be referred back to endo

## 2019-01-12 NOTE — Telephone Encounter (Signed)
Called pt left msg to call back and make lab appt

## 2019-01-18 ENCOUNTER — Other Ambulatory Visit: Payer: 59

## 2019-01-25 ENCOUNTER — Other Ambulatory Visit: Payer: Self-pay

## 2019-01-25 ENCOUNTER — Other Ambulatory Visit (INDEPENDENT_AMBULATORY_CARE_PROVIDER_SITE_OTHER): Payer: 59

## 2019-01-25 DIAGNOSIS — I10 Essential (primary) hypertension: Secondary | ICD-10-CM

## 2019-01-25 DIAGNOSIS — E039 Hypothyroidism, unspecified: Secondary | ICD-10-CM

## 2019-01-25 LAB — COMPREHENSIVE METABOLIC PANEL
ALT: 10 U/L (ref 0–35)
AST: 14 U/L (ref 0–37)
Albumin: 3.8 g/dL (ref 3.5–5.2)
Alkaline Phosphatase: 92 U/L (ref 39–117)
BUN: 20 mg/dL (ref 6–23)
CO2: 29 mEq/L (ref 19–32)
Calcium: 9.9 mg/dL (ref 8.4–10.5)
Chloride: 103 mEq/L (ref 96–112)
Creatinine, Ser: 0.7 mg/dL (ref 0.40–1.20)
GFR: 108.42 mL/min (ref >60.00)
Glucose, Bld: 94 mg/dL (ref 70–99)
Potassium: 3.8 mEq/L (ref 3.5–5.1)
Sodium: 142 mEq/L (ref 135–145)
Total Bilirubin: 0.5 mg/dL (ref 0.2–1.2)
Total Protein: 7 g/dL (ref 6.0–8.3)

## 2019-01-25 LAB — LIPID PANEL
Cholesterol: 220 mg/dL — ABNORMAL HIGH (ref 0–200)
HDL: 71 mg/dL (ref >39.00)
LDL Cholesterol: 136 mg/dL — ABNORMAL HIGH (ref 0–99)
NonHDL: 148.78
Total CHOL/HDL Ratio: 3
Triglycerides: 62 mg/dL (ref 0.0–149.0)
VLDL: 12.4 mg/dL (ref 0.0–40.0)

## 2019-01-25 LAB — TSH: TSH: 0.25 u[IU]/mL — ABNORMAL LOW (ref 0.35–4.50)

## 2019-01-26 ENCOUNTER — Other Ambulatory Visit: Payer: Self-pay | Admitting: Family Medicine

## 2019-01-26 DIAGNOSIS — E039 Hypothyroidism, unspecified: Secondary | ICD-10-CM

## 2019-01-27 ENCOUNTER — Other Ambulatory Visit: Payer: Self-pay

## 2019-01-27 MED ORDER — LEVOTHYROXINE SODIUM 137 MCG PO TABS: 137.0000 ug | ORAL_TABLET | Freq: Every day | ORAL | 2 refills | Status: DC

## 2019-02-11 ENCOUNTER — Ambulatory Visit
Admission: RE | Admit: 2019-02-11 | Discharge: 2019-02-11 | Disposition: A | Discharge status: Home / Self Care | Payer: 59 | Source: Ambulatory Visit | Attending: Obstetrics and Gynecology | Admitting: Obstetrics and Gynecology

## 2019-02-11 ENCOUNTER — Other Ambulatory Visit: Payer: Self-pay

## 2019-02-11 DIAGNOSIS — Z1231 Encounter for screening mammogram for malignant neoplasm of breast: Secondary | ICD-10-CM

## 2019-02-12 ENCOUNTER — Other Ambulatory Visit: Payer: Self-pay | Admitting: Obstetrics and Gynecology

## 2019-02-12 DIAGNOSIS — R928 Other abnormal and inconclusive findings on diagnostic imaging of breast: Secondary | ICD-10-CM

## 2019-02-23 ENCOUNTER — Ambulatory Visit
Admission: RE | Admit: 2019-02-23 | Discharge: 2019-02-23 | Disposition: A | Discharge status: Home / Self Care | Payer: Managed Care, Other (non HMO) | Source: Ambulatory Visit | Attending: Obstetrics and Gynecology | Admitting: Obstetrics and Gynecology

## 2019-02-23 ENCOUNTER — Ambulatory Visit
Admission: RE | Admit: 2019-02-23 | Discharge: 2019-02-23 | Disposition: A | Discharge status: Home / Self Care | Payer: 59 | Source: Ambulatory Visit | Attending: Obstetrics and Gynecology | Admitting: Obstetrics and Gynecology

## 2019-02-23 ENCOUNTER — Other Ambulatory Visit: Payer: Self-pay

## 2019-02-23 ENCOUNTER — Other Ambulatory Visit: Payer: Self-pay | Admitting: Obstetrics and Gynecology

## 2019-02-23 ENCOUNTER — Ambulatory Visit: Payer: 59

## 2019-02-23 DIAGNOSIS — R928 Other abnormal and inconclusive findings on diagnostic imaging of breast: Secondary | ICD-10-CM

## 2019-02-23 DIAGNOSIS — N6320 Unspecified lump in the left breast, unspecified quadrant: Secondary | ICD-10-CM

## 2019-03-09 ENCOUNTER — Other Ambulatory Visit: Payer: Self-pay

## 2019-03-11 ENCOUNTER — Ambulatory Visit (INDEPENDENT_AMBULATORY_CARE_PROVIDER_SITE_OTHER): Payer: 59 | Admitting: Internal Medicine

## 2019-03-11 ENCOUNTER — Encounter: Payer: Self-pay | Admitting: Internal Medicine

## 2019-03-11 ENCOUNTER — Other Ambulatory Visit: Payer: Self-pay

## 2019-03-11 VITALS — BP 118/60 | HR 82 | Ht 66.0 in | Wt 235.0 lb

## 2019-03-11 DIAGNOSIS — E039 Hypothyroidism, unspecified: Secondary | ICD-10-CM | POA: Diagnosis not present

## 2019-03-11 DIAGNOSIS — R7309 Other abnormal glucose: Secondary | ICD-10-CM | POA: Diagnosis not present

## 2019-03-11 LAB — TSH: TSH: 0.48 u[IU]/mL (ref 0.35–4.50)

## 2019-03-11 LAB — T4, FREE: Free T4: 1.4 ng/dL (ref 0.60–1.60)

## 2019-03-11 LAB — HEMOGLOBIN A1C: Hgb A1c MFr Bld: 5.7 % (ref 4.6–6.5)

## 2019-03-11 MED ORDER — LEVOTHYROXINE SODIUM 137 MCG PO TABS: 137.0000 ug | ORAL_TABLET | Freq: Every day | ORAL | 3 refills | Status: DC

## 2019-03-11 NOTE — Progress Notes (Signed)
Patient ID: [name], female   DOB: September 25, 1970, 49 y.o.   MRN: [id number]  .This visit occurred during the SARS-CoV-2 public health emergency.  Safety protocols were in place, including screening questions prior to the visit, additional usage of staff PPE, and extensive cleaning of exam room while observing appropriate contact time as indicated for disinfecting solutions.   HPI  [name] is a 49 y.o.-year-old female, returning for f/u for hypothyroidism. Last visit 1 year ago.  She continues to work from home, which she was to give her before the coronavirus pandemic.  Reviewed history: Pt. has been dx with hypothyroidism in ~2005 >> started on Levothyroxine 112 mcg for "years", then increased up to 200 mcg.  She has a history of noncompliance with her levothyroxine, now taking it every day.  At last visit, she was on 150 mcg daily of LT4 but the TSH was very high when checked by PCP last summer and the LT4 dose was increased to 200 mcg daily at that time, however, patient recalls that at that time she had a vomiting 2/2 lap band too tight >> had it stretched since.  A TSH returned suppressed in 01/2019, after which the dose of levothyroxine was decreased.  She is currently on generic levothyroxine 137 mcg daily (last dose change 01/2019, by PCP): - in am - fasting - at least 30 min from b'fast - no Ca, Fe - + Multivitamins and occasional Prilosec more than 4 hours after LT4 - Stopped Biotin 10,000 mcg daily  Reviewed her TFTs: Lab Results  Component Value Date   TSH 0.25 (L) 01/25/2019   TSH 52.63 (H) 09/25/2018   TSH 0.91 04/06/2018   TSH 3.03 04/15/2017   TSH 1.50 03/14/2017   TSH 0.64 06/18/2016   TSH 0.02 (L) 04/19/2016   TSH 0.04 (L) 03/15/2016   TSH 1.20 03/15/2015   TSH 0.48 11/14/2014   FREET4 1.28 04/06/2018   FREET4 1.29 03/14/2017   FREET4 0.90 06/18/2016   FREET4 2.0 (H) 04/19/2016   FREET4 1.99 (H) 03/15/2016   FREET4 0.99 03/15/2015   FREET4 1.19  11/14/2014   FREET4 0.93 04/27/2014   FREET4 0.3 (L) 01/30/2006   Reviewed the report of her: Thyroid U/S (02/20/2006): Heterogeneous thyroid, no nodules  Pt denies: - feeling nodules in neck - hoarseness - dysphagia - choking - SOB with lying down  She has + FH of thyroid disorders in: mother (nodule). No FH of thyroid cancer. No h/o radiation tx to head or neck.  No seaweed or kelp. No recent contrast studies. No herbal supplements. No Biotin use. No recent steroids use.   She has a history of lap band in 2009.  She lost approximately 100 pounds (from 300 pounds).  ROS: Constitutional: no weight gain/no weight loss, no fatigue, no subjective hyperthermia, no subjective hypothermia Eyes: no blurry vision, no xerophthalmia ENT: no sore throat, + see HPI Cardiovascular: no CP/no SOB/no palpitations/no leg swelling Respiratory: no cough/no SOB/no wheezing Gastrointestinal: no N/no V/no D/no C/no acid reflux Musculoskeletal: no muscle aches/no joint aches Skin: no rashes, no hair loss Neurological: no tremors/no numbness/no tingling/no dizziness  I reviewed pt's medications, allergies, PMH, social hx, family hx, and changes were documented in the history of present illness. Otherwise, unchanged from my initial visit note.   Past Medical History:  Diagnosis Date  . Contact lens/glasses fitting   . Hypertension   . Thyroid disease    Past Surgical History:  Procedure Laterality Date  .  ABDOMINAL HYSTERECTOMY  01/2010   [facility]  . CESAREAN SECTION  12/2003  . LAPAROSCOPIC GASTRIC BANDING  10/12/2007  . myomectomy     2x's. Patient unsure of date of procedure based on history form dated 11/10/2009.   Social History   Social History  . Marital Status: single    Spouse Name: N/A  . Number of Children: 1   Occupational History  . 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 Smoking status: Never Smoker   . Smokeless tobacco: Never Used  . Alcohol Use: No  .  Drug Use: No  . Sexual Activity:    Partners: Male   Social History Narrative   Exercising---treadmill 4x a week   Current Outpatient Medications on File Prior to Visit  Medication Sig Dispense Refill  . Albuterol Sulfate (PROAIR RESPICLICK) 123XX123 (90 Base) MCG/ACT AEPB Inhale 2 puffs into the lungs 4 (four) times daily as needed. (Patient not taking: Reported on 09/25/2018) 1 each 0  . amLODipine (NORVASC) 10 MG tablet Take 1 tablet (10 mg total) by mouth daily. 90 tablet 1  . hydrochlorothiazide (HYDRODIURIL) 25 MG tablet Take 1 tablet (25 mg total) by mouth daily. 90 tablet 3  . levothyroxine (SYNTHROID) 137 MCG tablet Take 1 tablet (137 mcg total) by mouth daily before breakfast. 30 tablet 2  . omeprazole (PRILOSEC) 20 MG capsule Take 1 capsule (20 mg total) by mouth daily. 90 capsule 3  . promethazine (PHENERGAN) 25 MG tablet Take 1 tablet (25 mg total) by mouth every 6 (six) hours as needed for nausea. 30 tablet 1  . traMADol (ULTRAM) 50 MG tablet Take 1 tablet (50 mg total) by mouth every 8 (eight) hours as needed. 30 tablet 1   No current facility-administered medications on file prior to visit.   Allergies  Allergen Reactions  . Diflucan [Fluconazole] Swelling    Blister and swelling in mouth  . Penicillins Rash   Family History  Problem Relation Age of Onset  . Hypertension Mother   . Thyroid disease Mother   . Breast cancer Mother 39  . Hypertension Father   . Hypertension Sister   . Cancer Maternal Aunt        colon   PE: BP 118/60   Pulse 82   Ht 5\' 6"  (1.676 m)   Wt 235 lb (106.6 kg)   SpO2 96%   BMI 37.93 kg/m  Body mass index is 37.93 kg/m. Wt Readings from Last 3 Encounters:  03/11/19 235 lb (106.6 kg)  09/25/18 226 lb 12.8 oz (102.9 kg)  03/10/18 231 lb (104.8 kg)   Constitutional: overweight, in NAD Eyes: PERRLA, EOMI, no exophthalmos ENT: moist mucous membranes, no thyromegaly, no cervical lymphadenopathy Cardiovascular: RRR, No MRG Respiratory:  CTA B Gastrointestinal: abdomen soft, NT, ND, BS+ Musculoskeletal: no deformities, strength intact in all 4 Skin: moist, warm, no rashes Neurological: no tremor with outstretched hands, DTR normal in all 4  ASSESSMENT: 1. Hypothyroidism - poorly controlled - h/o noncompliance with LT4  2.  Elevated HbA1c  PLAN:  1. Patient with longstanding hypothyroidism, on levothyroxine therapy with previous noncompliance with the medication.  When I last saw her, her TFTs are more controlled, however, repeat TSH by PCP in 09/2018 was very high, at 52.6.  At that time, she was on 150 mcg of levothyroxine daily.  The dose was increased to 200 mcg daily by PCP but the subsequent TSH was suppressed so her dose was decreased to 137 mcg  daily in 01/2019.  - Reviewed the report of her last ultrasound: No nodules - latest thyroid labs reviewed with pt >> suppressed at last check: Lab Results  Component Value Date   TSH 0.25 (L) 01/25/2019   - she continues on LT4 137 mcg daily - pt feels good on this dose. - we discussed about taking the thyroid hormone every day, with water, >30 minutes before breakfast, separated by >4 hours from acid reflux medications, calcium, iron, multivitamins. Pt. is taking it correctly. - will check thyroid tests today: TSH and fT4 and will adjust the dose of levothyroxine accordingly - If labs are abnormal, she will need to return for repeat TFTs in 1.5 months - I will see her back in 6 months but most likely sooner for labs  2.  Elevated HbA1c - last HbA1c 5.7% borderline with prediabetes - no increased urination, thirst, or blurry vision - will repeat HbA1c today - discussed about plant-based diet, upon her questioning and I explained why recommend this diet not only for losing weight but for overall health  Patient Instructions  Please stop at the lab.  Please continue Synthroid 137 mcg daily.  Take the thyroid hormone every day, with water, at least 30 minutes before  breakfast, separated by at least 4 hours from: - acid reflux medications - calcium - iron - multivitamins  Please come back for a follow-up appointment in 6 months.  Needs refills - 3 months!  Component     Latest Ref Rng & Units 03/11/2019  TSH     0.35 - 4.50 uIU/mL 0.48  T4,Free(Direct)     0.60 - 1.60 ng/dL 1.40  Hemoglobin A1C     4.6 - 6.5 % 5.7   HbA1c is stable.  TSH is normal, on the lower end of normal.  For now, we will refill the 137 mcg daily without like for her to return for another TSH and free T4 in 1.5 months to ensure stability.   Philemon Kingdom, MD PhD [facility]

## 2019-03-11 NOTE — Patient Instructions (Signed)
Please stop at the lab.  Please continue Synthroid 137 mcg daily.  Take the thyroid hormone every day, with water, at least 30 minutes before breakfast, separated by at least 4 hours from: - acid reflux medications - calcium - iron - multivitamins  Please come back for a follow-up appointment in 6 months.

## 2019-03-29 ENCOUNTER — Ambulatory Visit: Payer: 59 | Admitting: Family Medicine

## 2019-03-30 ENCOUNTER — Other Ambulatory Visit: Payer: Self-pay | Admitting: Family Medicine

## 2019-03-30 DIAGNOSIS — J9801 Acute bronchospasm: Secondary | ICD-10-CM

## 2019-03-30 MED ORDER — PROAIR RESPICLICK 108 (90 BASE) MCG/ACT IN AEPB: 2.0000 | INHALATION_SPRAY | Freq: Four times a day (QID) | RESPIRATORY_TRACT | 0 refills | Status: DC | PRN

## 2019-05-13 ENCOUNTER — Other Ambulatory Visit: Payer: Self-pay | Admitting: Internal Medicine

## 2019-05-13 ENCOUNTER — Other Ambulatory Visit: Payer: Self-pay

## 2019-05-13 ENCOUNTER — Other Ambulatory Visit (INDEPENDENT_AMBULATORY_CARE_PROVIDER_SITE_OTHER): Payer: 59

## 2019-05-13 DIAGNOSIS — E039 Hypothyroidism, unspecified: Secondary | ICD-10-CM

## 2019-05-13 LAB — T4, FREE: Free T4: 1.33 ng/dL (ref 0.60–1.60)

## 2019-05-13 LAB — TSH: TSH: 0.2 u[IU]/mL — ABNORMAL LOW (ref 0.35–4.50)

## 2019-05-13 MED ORDER — LEVOTHYROXINE SODIUM 125 MCG PO TABS: 125.0000 ug | ORAL_TABLET | Freq: Every day | ORAL | 3 refills | Status: DC

## 2019-05-24 ENCOUNTER — Other Ambulatory Visit: Payer: Self-pay | Admitting: Family Medicine

## 2019-05-24 DIAGNOSIS — I10 Essential (primary) hypertension: Secondary | ICD-10-CM

## 2019-05-31 ENCOUNTER — Other Ambulatory Visit: Payer: Self-pay | Admitting: Family Medicine

## 2019-05-31 DIAGNOSIS — M25561 Pain in right knee: Secondary | ICD-10-CM

## 2019-07-02 ENCOUNTER — Other Ambulatory Visit (INDEPENDENT_AMBULATORY_CARE_PROVIDER_SITE_OTHER): Payer: 59

## 2019-07-02 ENCOUNTER — Encounter: Payer: Self-pay | Admitting: Internal Medicine

## 2019-07-02 ENCOUNTER — Other Ambulatory Visit: Payer: Self-pay

## 2019-07-02 DIAGNOSIS — E039 Hypothyroidism, unspecified: Secondary | ICD-10-CM

## 2019-07-02 LAB — T4, FREE: Free T4: 1.09 ng/dL (ref 0.60–1.60)

## 2019-07-02 LAB — TSH: TSH: 0.64 u[IU]/mL (ref 0.35–4.50)

## 2019-07-02 MED ORDER — LEVOTHYROXINE SODIUM 125 MCG PO TABS: 125.0000 ug | ORAL_TABLET | Freq: Every day | ORAL | 3 refills | Status: DC

## 2019-08-24 ENCOUNTER — Other Ambulatory Visit: Payer: Self-pay | Admitting: Family Medicine

## 2019-08-24 DIAGNOSIS — I10 Essential (primary) hypertension: Secondary | ICD-10-CM

## 2019-09-09 ENCOUNTER — Ambulatory Visit: Payer: Managed Care, Other (non HMO) | Admitting: Internal Medicine

## 2019-09-17 ENCOUNTER — Other Ambulatory Visit: Payer: Self-pay | Admitting: Family Medicine

## 2019-09-17 DIAGNOSIS — I10 Essential (primary) hypertension: Secondary | ICD-10-CM

## 2019-09-30 ENCOUNTER — Other Ambulatory Visit: Payer: Self-pay | Admitting: Family Medicine

## 2019-09-30 DIAGNOSIS — I10 Essential (primary) hypertension: Secondary | ICD-10-CM

## 2019-10-02 ENCOUNTER — Other Ambulatory Visit: Payer: Self-pay | Admitting: Family Medicine

## 2019-10-02 DIAGNOSIS — I10 Essential (primary) hypertension: Secondary | ICD-10-CM

## 2019-10-29 ENCOUNTER — Ambulatory Visit (INDEPENDENT_AMBULATORY_CARE_PROVIDER_SITE_OTHER): Payer: No Typology Code available for payment source | Admitting: Internal Medicine

## 2019-10-29 ENCOUNTER — Other Ambulatory Visit: Payer: Self-pay

## 2019-10-29 ENCOUNTER — Encounter: Payer: Self-pay | Admitting: Internal Medicine

## 2019-10-29 VITALS — BP 120/82 | HR 97 | Ht 66.0 in | Wt 245.0 lb

## 2019-10-29 DIAGNOSIS — E669 Obesity, unspecified: Secondary | ICD-10-CM | POA: Diagnosis not present

## 2019-10-29 DIAGNOSIS — E039 Hypothyroidism, unspecified: Secondary | ICD-10-CM

## 2019-10-29 DIAGNOSIS — R7309 Other abnormal glucose: Secondary | ICD-10-CM

## 2019-10-29 LAB — POCT GLYCOSYLATED HEMOGLOBIN (HGB A1C): Hemoglobin A1C: 5.4 % (ref 4.0–5.6)

## 2019-10-29 NOTE — Addendum Note (Signed)
Addended by: Cardell Peach I on: 10/29/2019 02:28 PM   Modules accepted: Orders

## 2019-10-29 NOTE — Patient Instructions (Addendum)
Please continue Levothyroxine 125 mcg daily.  Take the thyroid hormone every day, with water, at least 30 minutes before breakfast, separated by at least 4 hours from: - acid reflux medications - calcium - iron - multivitamins  Please come back for a follow-up appointment in 1 year but for thyroid labs after the Holidays.

## 2019-10-29 NOTE — Progress Notes (Signed)
Patient ID: [name], female   DOB: 10/10/1970, 49 y.o.   MRN: [id number]  This visit occurred during the SARS-CoV-2 public health emergency.  Safety protocols were in place, including screening questions prior to the visit, additional usage of staff PPE, and extensive cleaning of exam room while observing appropriate contact time as indicated for disinfecting solutions.   HPI  [name] is a 49 y.o.-year-old female, returning for f/u for hypothyroidism. Last visit 7.5 months ago.  She is now working from home permanently.  Reviewed history: Pt. has been dx with hypothyroidism in ~2005 >> started on Levothyroxine 112 mcg for "years", then increased up to 200 mcg.  She has a history of noncompliance with her levothyroxine, now taking it every day.  At last visit, she was on 150 mcg daily of LT4 but the TSH was very high when checked by PCP last summer and the LT4 dose was increased to 200 mcg daily at that time, however, patient recalls that at that time she had a vomiting 2/2 lap band too tight >> had it stretched since.  A TSH returned suppressed in 01/2019, after which the dose of levothyroxine was decreased  Pt is on levothyroxine 125 mcg daily, taken: - in am - fasting - at least 30 min from b'fast - no Ca, Fe, + MVI and occasional PPIs more than 4 hours after levothyroxine - not on Biotin now, previously on 10,000 mcg daily.  Reviewed her TFTs: Lab Results  Component Value Date   TSH 0.64 07/02/2019   TSH 0.20 (L) 05/13/2019   TSH 0.48 03/11/2019   TSH 0.25 (L) 01/25/2019   TSH 52.63 (H) 09/25/2018   TSH 0.91 04/06/2018   TSH 3.03 04/15/2017   TSH 1.50 03/14/2017   TSH 0.64 06/18/2016   TSH 0.02 (L) 04/19/2016   FREET4 1.09 07/02/2019   FREET4 1.33 05/13/2019   FREET4 1.40 03/11/2019   FREET4 1.28 04/06/2018   FREET4 1.29 03/14/2017   FREET4 0.90 06/18/2016   FREET4 2.0 (H) 04/19/2016   FREET4 1.99 (H) 03/15/2016   FREET4 0.99 03/15/2015   FREET4 1.19  11/14/2014   Reviewed the report of her thyroid ultrasound: Thyroid U/S (02/20/2006): Heterogeneous thyroid, no nodules  Pt denies: - feeling nodules in neck - hoarseness - dysphagia - choking - SOB with lying down  She has + FH of thyroid disorders in: mother (nodule). No FH of thyroid cancer. No h/o radiation tx to head or neck.  No seaweed or kelp. No recent contrast studies. No herbal supplements. No Biotin use. No recent steroids use.   She has a history of lap band in 2009.  She lost approximately 100 pounds (from 300 pounds).  Latest HbA1c : Lab Results  Component Value Date   HGBA1C 5.7 03/11/2019   HGBA1C 5.7 03/10/2018   She stopped beef and pork in 07/2019. Still eating chicken and also eats tofu.  ROS: Constitutional: + weight gain/no weight loss, no fatigue, no subjective hyperthermia, no subjective hypothermia Eyes: no blurry vision, no xerophthalmia ENT: no sore throat, + see HPI Cardiovascular: no CP/no SOB/no palpitations/no leg swelling Respiratory: no cough/no SOB/no wheezing Gastrointestinal: no N/no V/no D/no C/no acid reflux Musculoskeletal: no muscle aches/no joint aches Skin: no rashes, no hair loss Neurological: no tremors/no numbness/no tingling/no dizziness  I reviewed pt's medications, allergies, PMH, social hx, family hx, and changes were documented in the history of present illness. Otherwise, unchanged from my initial visit note.   Past Medical History:  Diagnosis Date  . Contact lens/glasses fitting   . Hypertension   . Thyroid disease    Past Surgical History:  Procedure Laterality Date  . ABDOMINAL HYSTERECTOMY  01/2010   [facility]  . CESAREAN SECTION  12/2003  . LAPAROSCOPIC GASTRIC BANDING  10/12/2007  . myomectomy     2x's. Patient unsure of date of procedure based on history form dated 11/10/2009.   Social History   Social History  . Marital Status: single    Spouse Name: N/A  . Number of Children: 1   Occupational History  .  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 Smoking status: Never Smoker   . Smokeless tobacco: Never Used  . Alcohol Use: No  . Drug Use: No  . Sexual Activity:    Partners: Male   Social History Narrative   Exercising---treadmill 4x a week   Current Outpatient Medications on File Prior to Visit  Medication Sig Dispense Refill  . Albuterol Sulfate (PROAIR RESPICLICK) 517 (90 Base) MCG/ACT AEPB Inhale 2 puffs into the lungs 4 (four) times daily as needed. 1 each 0  . amLODipine (NORVASC) 10 MG tablet TAKE 1 TABLET (10 MG TOTAL) BY MOUTH DAILY. NEEDS OV/FOLLOW UP 30 tablet 0  . hydrochlorothiazide (HYDRODIURIL) 25 MG tablet Take 1 tablet (25 mg total) by mouth daily. 90 tablet 0  . levothyroxine (SYNTHROID) 125 MCG tablet Take 1 tablet (125 mcg total) by mouth daily before breakfast. 90 tablet 3  . omeprazole (PRILOSEC) 20 MG capsule Take 1 capsule (20 mg total) by mouth daily. 90 capsule 3  . promethazine (PHENERGAN) 25 MG tablet TAKE 1 TABLET BY MOUTH EVERY 6 HOURS AS NEEDED FOR NAUSEA 30 tablet 1  . traMADol (ULTRAM) 50 MG tablet Take 1 tablet (50 mg total) by mouth every 8 (eight) hours as needed. 30 tablet 1   No current facility-administered medications on file prior to visit.   Allergies  Allergen Reactions  . Diflucan [Fluconazole] Swelling    Blister and swelling in mouth  . Penicillins Rash   Family History  Problem Relation Age of Onset  . Hypertension Mother   . Thyroid disease Mother   . Breast cancer Mother 17  . Hypertension Father   . Hypertension Sister   . Cancer Maternal Aunt        colon   PE: BP 120/82   Pulse 97   Ht 5\' 6"  (1.676 m)   Wt 245 lb (111.1 kg)   SpO2 97%   BMI 39.54 kg/m  Body mass index is 39.54 kg/m. Wt Readings from Last 3 Encounters:  10/29/19 245 lb (111.1 kg)  03/11/19 235 lb (106.6 kg)  09/25/18 226 lb 12.8 oz (102.9 kg)   Constitutional: overweight, in NAD Eyes: PERRLA, EOMI, no exophthalmos ENT: moist  mucous membranes, no thyromegaly, no cervical lymphadenopathy Cardiovascular: tachycardia, RR, No MRG Respiratory: CTA B Gastrointestinal: abdomen soft, NT, ND, BS+ Musculoskeletal: no deformities, strength intact in all 4 Skin: moist, warm, no rashes Neurological: no tremor with outstretched hands, DTR normal in all 4  ASSESSMENT: 1. Hypothyroidism - poorly controlled - h/o noncompliance with LT4  2.  Elevated HbA1c  3.  Obesity class II  PLAN:  1. Patient with longstanding hypothyroidism, on levothyroxine therapy with previous noncompliance with the medication.  When I last saw her, her TFTs are more controlled, however, repeat TSH by PCP in 09/2018 was very high, at 52.6.  At that time, she was on 150 mcg  of levothyroxine daily.  The dose was increased to 200 mcg daily by PCP but the subsequent TSH was suppressed so her dose was decreased to 137 mcg daily in 01/2019 and 125 mcg daily 05/2019. -Reviewed the report of her latest thyroid ultrasound: No nodules - latest thyroid labs reviewed with pt >> normal: Lab Results  Component Value Date   TSH 0.64 07/02/2019   - she continues on LT4 125 mcg daily - pt feels good on this dose. - we discussed about taking the thyroid hormone every day, with water, >30 minutes before breakfast, separated by >4 hours from acid reflux medications, calcium, iron, multivitamins. Pt. is taking it correctly. -I will see her back in a year but we can repeat the labs again after the holidays  2.  Elevated HbA1c -Last HbA1c was 5.7%, borderline in the prediabetic range -Discussed about ranges were normal and prediabetic HbA1c levels -At this visit, she denies increased urination, thirst, or blurry vision -Repeated HbA1c today was 5.4% (lower)  3.  Obesity class II -She gained approximately 10 pounds since last visit -At last visit I recommended a plant-based diet >> she cut out pork and beef but she is having difficulties cutting out chicken.  However,  she was able to decrease the frequency of eating poultry and eats more seafood.  She has been eating cheese and bread.  Upon her questioning, we discussed about alternatives to bread and cheese.  Given specific examples.  I also encouraged her to continue to cut down on her chicken.  She does not snack between meals and does not eat excessive amounts of sweets.   Philemon Kingdom, MD PhD [facility] Endocrinology

## 2019-11-09 ENCOUNTER — Encounter: Payer: Self-pay | Admitting: Family Medicine

## 2019-11-09 DIAGNOSIS — I10 Essential (primary) hypertension: Secondary | ICD-10-CM

## 2019-11-09 MED ORDER — AMLODIPINE BESYLATE 10 MG PO TABS: 10.0000 mg | ORAL_TABLET | Freq: Every day | ORAL | 0 refills | Status: DC

## 2019-12-13 ENCOUNTER — Other Ambulatory Visit: Payer: Self-pay | Admitting: Family Medicine

## 2019-12-13 DIAGNOSIS — K219 Gastro-esophageal reflux disease without esophagitis: Secondary | ICD-10-CM

## 2019-12-27 ENCOUNTER — Other Ambulatory Visit: Payer: Self-pay

## 2019-12-27 ENCOUNTER — Ambulatory Visit (INDEPENDENT_AMBULATORY_CARE_PROVIDER_SITE_OTHER): Payer: No Typology Code available for payment source | Admitting: Family Medicine

## 2019-12-27 ENCOUNTER — Encounter: Payer: Self-pay | Admitting: Family Medicine

## 2019-12-27 VITALS — BP 120/86 | HR 88 | Temp 98.3°F | Resp 18 | Ht 66.0 in | Wt 234.4 lb

## 2019-12-27 DIAGNOSIS — Z1159 Encounter for screening for other viral diseases: Secondary | ICD-10-CM

## 2019-12-27 DIAGNOSIS — Z0000 Encounter for general adult medical examination without abnormal findings: Secondary | ICD-10-CM | POA: Diagnosis not present

## 2019-12-27 DIAGNOSIS — I10 Essential (primary) hypertension: Secondary | ICD-10-CM | POA: Diagnosis not present

## 2019-12-27 DIAGNOSIS — M25561 Pain in right knee: Secondary | ICD-10-CM | POA: Diagnosis not present

## 2019-12-27 DIAGNOSIS — Z23 Encounter for immunization: Secondary | ICD-10-CM

## 2019-12-27 DIAGNOSIS — E039 Hypothyroidism, unspecified: Secondary | ICD-10-CM

## 2019-12-27 DIAGNOSIS — Z79899 Other long term (current) drug therapy: Secondary | ICD-10-CM

## 2019-12-27 DIAGNOSIS — Z1211 Encounter for screening for malignant neoplasm of colon: Secondary | ICD-10-CM

## 2019-12-27 MED ORDER — TRAMADOL HCL 50 MG PO TABS: 50.0000 mg | ORAL_TABLET | Freq: Three times a day (TID) | ORAL | 1 refills | Status: DC | PRN

## 2019-12-27 MED ORDER — HYDROCHLOROTHIAZIDE 25 MG PO TABS: 25.0000 mg | ORAL_TABLET | Freq: Every day | ORAL | 0 refills | Status: DC

## 2019-12-27 MED ORDER — AMLODIPINE BESYLATE 10 MG PO TABS: 10.0000 mg | ORAL_TABLET | Freq: Every day | ORAL | 0 refills | Status: DC

## 2019-12-27 NOTE — Patient Instructions (Signed)

## 2019-12-27 NOTE — Progress Notes (Signed)
Subjective:     [name] is a 49 y.o. female and is here for a comprehensive physical exam. The patient reports no problems.  Social History   Socioeconomic History  . Marital status: Divorced    Spouse name: Not on file  . Number of children: Not on file  . Years of education: Not on file  . Highest education level: Not on file  Occupational History  . Not on file  Tobacco Use  . Smoking status: Never Smoker  . Smokeless tobacco: Never Used  Substance and Sexual Activity  . Alcohol use: No  . Drug use: No  . Sexual activity: Not on file  Other Topics Concern  . Not on file  Social History Narrative   Exercising---treadmill 2x a week   Social Determinants of Health   Financial Resource Strain:   . Difficulty of Paying Living Expenses: Not on file  Food Insecurity:   . Worried About Charity fundraiser in the Last Year: Not on file  . Ran Out of Food in the Last Year: Not on file  Transportation Needs:   . Lack of Transportation (Medical): Not on file  . Lack of Transportation (Non-Medical): Not on file  Physical Activity:   . Days of Exercise per Week: Not on file  . Minutes of Exercise per Session: Not on file  Stress:   . Feeling of Stress : Not on file  Social Connections:   . Frequency of Communication with Friends and Family: Not on file  . Frequency of Social Gatherings with Friends and Family: Not on file  . Attends Religious Services: Not on file  . Active Member of Clubs or Organizations: Not on file  . Attends Archivist Meetings: Not on file  . Marital Status: Not on file  Intimate Partner Violence:   . Fear of Current or Ex-Partner: Not on file  . Emotionally Abused: Not on file  . Physically Abused: Not on file  . Sexually Abused: Not on file   Health Maintenance  Topic Date Due  . Hepatitis C Screening  Never done  . INFLUENZA VACCINE  10/03/2019  . HIV Screening  04/16/2023 (Originally 11/02/1986)  . MAMMOGRAM  02/23/2020  .  TETANUS/TDAP  03/13/2021  . PAP SMEAR-Modifier  04/06/2021  . COVID-19 Vaccine  Completed    The following portions of the patient's history were reviewed and updated as appropriate:  She  has a past medical history of Contact lens/glasses fitting, Hypertension, and Thyroid disease. She does not have any pertinent problems on file. She  has a past surgical history that includes Laparoscopic gastric banding (10/12/2007); Cesarean section (12/2003); myomectomy; and Abdominal hysterectomy (01/2010). Her family history includes Breast cancer (age of onset: 35) in her mother; Cancer in her maternal aunt; Hypertension in her father, mother, and sister; Thyroid disease in her mother. She  reports that she has never smoked. She has never used smokeless tobacco. She reports that she does not drink alcohol and does not use drugs. She has a current medication list which includes the following prescription(s): proair respiclick, amlodipine, hydrochlorothiazide, levothyroxine, omeprazole, promethazine, and tramadol. Current Outpatient Medications on File Prior to Visit  Medication Sig Dispense Refill  . Albuterol Sulfate (PROAIR RESPICLICK) 376 (90 Base) MCG/ACT AEPB Inhale 2 puffs into the lungs 4 (four) times daily as needed. 1 each 0  . levothyroxine (SYNTHROID) 125 MCG tablet Take 1 tablet (125 mcg total) by mouth daily before breakfast. 90 tablet 3  .  omeprazole (PRILOSEC) 20 MG capsule TAKE 1 CAPSULE BY MOUTH EVERY DAY 90 capsule 3  . promethazine (PHENERGAN) 25 MG tablet TAKE 1 TABLET BY MOUTH EVERY 6 HOURS AS NEEDED FOR NAUSEA 30 tablet 1   No current facility-administered medications on file prior to visit.   She is allergic to diflucan [fluconazole] and penicillins..  Review of Systems Review of Systems  Constitutional: Negative for activity change, appetite change and fatigue.  HENT: Negative for hearing loss, congestion, tinnitus and ear discharge.  dentist q58m Eyes: Negative for visual  disturbance (see optho q1y -- vision corrected to 20/20 with glasses).  Respiratory: Negative for cough, chest tightness and shortness of breath.   Cardiovascular: Negative for chest pain, palpitations and leg swelling.  Gastrointestinal: Negative for abdominal pain, diarrhea, constipation and abdominal distention.  Genitourinary: Negative for urgency, frequency, decreased urine volume and difficulty urinating.  Musculoskeletal: Negative for back pain, arthralgias and gait problem.  Skin: Negative for color change, pallor and rash.  Neurological: Negative for dizziness, light-headedness, numbness and headaches.  Hematological: Negative for adenopathy. Does not bruise/bleed easily.  Psychiatric/Behavioral: Negative for suicidal ideas, confusion, sleep disturbance, self-injury, dysphoric mood, decreased concentration and agitation.       Objective:    BP 120/86 (BP Location: Right Arm, Patient Position: Sitting, Cuff Size: Large)   Pulse 88   Temp 98.3 F (36.8 C) (Oral)   Resp 18   Ht 5\' 6"  (1.676 m)   Wt 234 lb 6.4 oz (106.3 kg)   SpO2 97%   BMI 37.83 kg/m  General appearance: alert, cooperative, appears stated age and no distress Head: Normocephalic, without obvious abnormality, atraumatic Eyes: negative findings: lids and lashes normal, conjunctivae and sclerae normal and pupils equal, round, reactive to light and accomodation Ears: normal TM's and external ear canals both ears Neck: no adenopathy, no carotid bruit, no JVD, supple, symmetrical, trachea midline and thyroid not enlarged, symmetric, no tenderness/mass/nodules Back: symmetric, no curvature. ROM normal. No CVA tenderness. Lungs: clear to auscultation bilaterally Breasts: gyn Heart: regular rate and rhythm, S1, S2 normal, no murmur, click, rub or gallop Abdomen: soft, non-tender; bowel sounds normal; no masses,  no organomegaly Pelvic: deferred -gyn Extremities: extremities normal, atraumatic, no cyanosis or  edema Pulses: 2+ and symmetric Skin: Skin color, texture, turgor normal. No rashes or lesions Lymph nodes: Cervical, supraclavicular, and axillary nodes normal. Neurologic: Alert and oriented X 3, normal strength and tone. Normal symmetric reflexes. Normal coordination and gait    Assessment:    Healthy female exam.  Plan:    ghm utd  Check labs  See After Visit Summary for Counseling Recommendations    1. Acute pain of right knee Stable---- with prn tramadol Weight loss is helping  - traMADol (ULTRAM) 50 MG tablet; Take 1 tablet (50 mg total) by mouth every 8 (eight) hours as needed.  Dispense: 30 tablet; Refill: 1  2. Essential hypertension Well controlled, no changes to meds. Encouraged heart healthy diet such as the DASH diet and exercise as tolerated.   - amLODipine (NORVASC) 10 MG tablet; Take 1 tablet (10 mg total) by mouth daily. NEEDS OV/FOLLOW UP  Dispense: 90 tablet; Refill: 0 - hydrochlorothiazide (HYDRODIURIL) 25 MG tablet; Take 1 tablet (25 mg total) by mouth daily.  Dispense: 90 tablet; Refill: 0 - CBC with Differential/Platelet - Lipid panel - Comprehensive metabolic panel  3. Need for influenza vaccination   - Flu Vaccine QUAD 36+ mos IM  4. Morbid obesity ([place]) S/p lab band  5. Preventative health care See above  - CBC with Differential/Platelet - Lipid panel - Comprehensive metabolic panel  6. Hypothyroidism, unspecified type Stable Per endo   7. Need for hepatitis C screening test   - Hepatitis C antibody  8. Colon cancer screening   - Ambulatory referral to Gastroenterology  9. High risk medication use   - DRUG MONITORING, PANEL 8 WITH CONFIRMATION, URINE

## 2019-12-28 LAB — LIPID PANEL
Cholesterol: 307 mg/dL — ABNORMAL HIGH (ref <200)
HDL: 83 mg/dL (ref >=50)
LDL Cholesterol (Calc): 198 mg/dL (calc) — ABNORMAL HIGH
Non-HDL Cholesterol (Calc): 224 mg/dL (calc) — ABNORMAL HIGH (ref <130)
Total CHOL/HDL Ratio: 3.7 (calc) (ref <5.0)
Triglycerides: 118 mg/dL (ref <150)

## 2019-12-28 LAB — DRUG MONITORING, PANEL 8 WITH CONFIRMATION, URINE
6 Acetylmorphine: NEGATIVE ng/mL (ref <10)
Alcohol Metabolites: NEGATIVE ng/mL
Amphetamines: NEGATIVE ng/mL (ref <500)
Benzodiazepines: NEGATIVE ng/mL (ref <100)
Buprenorphine, Urine: NEGATIVE ng/mL (ref <5)
Cocaine Metabolite: NEGATIVE ng/mL (ref <150)
Creatinine: >300 mg/dL
MDMA: NEGATIVE ng/mL (ref <500)
Marijuana Metabolite: NEGATIVE ng/mL (ref <20)
Opiates: NEGATIVE ng/mL (ref <100)
Oxidant: NEGATIVE ug/mL
Oxycodone: NEGATIVE ng/mL (ref <100)
pH: 6.5 (ref 4.5–9.0)

## 2019-12-28 LAB — CBC WITH DIFFERENTIAL/PLATELET
Absolute Monocytes: 293 cells/uL (ref 200–950)
Basophils Absolute: 30 cells/uL (ref 0–200)
Basophils Relative: 0.8 %
Eosinophils Absolute: 141 cells/uL (ref 15–500)
Eosinophils Relative: 3.7 %
HCT: 39.3 % (ref 35.0–45.0)
Hemoglobin: 12.7 g/dL (ref 11.7–15.5)
Lymphs Abs: 1516 cells/uL (ref 850–3900)
MCH: 27.9 pg (ref 27.0–33.0)
MCHC: 32.3 g/dL (ref 32.0–36.0)
MCV: 86.4 fL (ref 80.0–100.0)
MPV: 11.1 fL (ref 7.5–12.5)
Monocytes Relative: 7.7 %
Neutro Abs: 1820 cells/uL (ref 1500–7800)
Neutrophils Relative %: 47.9 %
Platelets: 258 10*3/uL (ref 140–400)
RBC: 4.55 10*6/uL (ref 3.80–5.10)
RDW: 13.3 % (ref 11.0–15.0)
Total Lymphocyte: 39.9 %
WBC: 3.8 10*3/uL (ref 3.8–10.8)

## 2019-12-28 LAB — COMPREHENSIVE METABOLIC PANEL
AG Ratio: 1.5 (calc) (ref 1.0–2.5)
ALT: 12 U/L (ref 6–29)
AST: 17 U/L (ref 10–35)
Albumin: 4.4 g/dL (ref 3.6–5.1)
Alkaline phosphatase (APISO): 90 U/L (ref 31–125)
BUN: 20 mg/dL (ref 7–25)
CO2: 28 mmol/L (ref 20–32)
Calcium: 10.1 mg/dL (ref 8.6–10.2)
Chloride: 100 mmol/L (ref 98–110)
Creat: 0.95 mg/dL (ref 0.50–1.10)
Globulin: 3 g/dL (calc) (ref 1.9–3.7)
Glucose, Bld: 91 mg/dL (ref 65–99)
Potassium: 3.4 mmol/L — ABNORMAL LOW (ref 3.5–5.3)
Sodium: 142 mmol/L (ref 135–146)
Total Bilirubin: 0.5 mg/dL (ref 0.2–1.2)
Total Protein: 7.4 g/dL (ref 6.1–8.1)

## 2019-12-28 LAB — DM TEMPLATE

## 2019-12-28 LAB — HEPATITIS C ANTIBODY
Hepatitis C Ab: NONREACTIVE
SIGNAL TO CUT-OFF: 0.01 (ref <1.00)

## 2019-12-29 ENCOUNTER — Other Ambulatory Visit: Payer: Self-pay | Admitting: Family Medicine

## 2019-12-29 ENCOUNTER — Encounter: Payer: Self-pay | Admitting: Family Medicine

## 2019-12-29 DIAGNOSIS — E785 Hyperlipidemia, unspecified: Secondary | ICD-10-CM

## 2020-03-17 ENCOUNTER — Other Ambulatory Visit: Payer: No Typology Code available for payment source

## 2020-03-22 ENCOUNTER — Other Ambulatory Visit: Payer: Self-pay | Admitting: Obstetrics and Gynecology

## 2020-03-22 DIAGNOSIS — Z1231 Encounter for screening mammogram for malignant neoplasm of breast: Secondary | ICD-10-CM

## 2020-04-02 ENCOUNTER — Other Ambulatory Visit: Payer: Self-pay | Admitting: Family Medicine

## 2020-04-02 DIAGNOSIS — I10 Essential (primary) hypertension: Secondary | ICD-10-CM

## 2020-04-04 ENCOUNTER — Other Ambulatory Visit: Payer: Self-pay

## 2020-04-04 ENCOUNTER — Other Ambulatory Visit (INDEPENDENT_AMBULATORY_CARE_PROVIDER_SITE_OTHER): Payer: No Typology Code available for payment source

## 2020-04-04 ENCOUNTER — Encounter: Payer: Self-pay | Admitting: Internal Medicine

## 2020-04-04 DIAGNOSIS — E039 Hypothyroidism, unspecified: Secondary | ICD-10-CM | POA: Diagnosis not present

## 2020-04-04 LAB — T4, FREE: Free T4: 0.8 ng/dL (ref 0.60–1.60)

## 2020-04-04 LAB — TSH: TSH: 20.09 u[IU]/mL — ABNORMAL HIGH (ref 0.35–4.50)

## 2020-04-05 ENCOUNTER — Other Ambulatory Visit: Payer: Self-pay | Admitting: Internal Medicine

## 2020-04-05 DIAGNOSIS — E039 Hypothyroidism, unspecified: Secondary | ICD-10-CM

## 2020-04-06 ENCOUNTER — Other Ambulatory Visit: Payer: Self-pay | Admitting: Internal Medicine

## 2020-04-06 MED ORDER — LEVOTHYROXINE SODIUM 125 MCG PO TABS: 125.0000 ug | ORAL_TABLET | Freq: Every day | ORAL | 3 refills | Status: DC

## 2020-05-02 ENCOUNTER — Other Ambulatory Visit: Payer: Self-pay | Admitting: Family Medicine

## 2020-05-02 DIAGNOSIS — I10 Essential (primary) hypertension: Secondary | ICD-10-CM

## 2020-05-09 ENCOUNTER — Ambulatory Visit: Payer: No Typology Code available for payment source

## 2020-05-19 ENCOUNTER — Inpatient Hospital Stay: Admission: RE | Admit: 2020-05-19 | Payer: No Typology Code available for payment source | Source: Ambulatory Visit

## 2020-05-31 ENCOUNTER — Other Ambulatory Visit (INDEPENDENT_AMBULATORY_CARE_PROVIDER_SITE_OTHER): Payer: No Typology Code available for payment source

## 2020-05-31 ENCOUNTER — Encounter: Payer: Self-pay | Admitting: Internal Medicine

## 2020-05-31 ENCOUNTER — Other Ambulatory Visit: Payer: Self-pay

## 2020-05-31 DIAGNOSIS — E039 Hypothyroidism, unspecified: Secondary | ICD-10-CM | POA: Diagnosis not present

## 2020-05-31 LAB — TSH: TSH: 26.17 u[IU]/mL — ABNORMAL HIGH (ref 0.35–4.50)

## 2020-05-31 LAB — T4, FREE: Free T4: 0.77 ng/dL (ref 0.60–1.60)

## 2020-06-01 ENCOUNTER — Encounter: Payer: Self-pay | Admitting: Internal Medicine

## 2020-06-01 ENCOUNTER — Telehealth (INDEPENDENT_AMBULATORY_CARE_PROVIDER_SITE_OTHER): Payer: No Typology Code available for payment source | Admitting: Internal Medicine

## 2020-06-01 DIAGNOSIS — E039 Hypothyroidism, unspecified: Secondary | ICD-10-CM | POA: Diagnosis not present

## 2020-06-01 DIAGNOSIS — R7309 Other abnormal glucose: Secondary | ICD-10-CM | POA: Diagnosis not present

## 2020-06-01 DIAGNOSIS — E669 Obesity, unspecified: Secondary | ICD-10-CM | POA: Diagnosis not present

## 2020-06-01 MED ORDER — LEVOTHYROXINE SODIUM 150 MCG PO TABS: 150.0000 ug | ORAL_TABLET | Freq: Every day | ORAL | 3 refills | Status: DC

## 2020-06-01 NOTE — Telephone Encounter (Signed)
Pt scheduled for 4 today for follow up.

## 2020-06-01 NOTE — Progress Notes (Signed)
Patient ID: [name], female   DOB: 1970-03-22, 50 y.o.   MRN: [id number]  Patient location: Home My location: Office Persons participating in the virtual visit: patient, provider  Referring Provider: Ann Held, DO  I connected with the patient on 06/01/20 at 3:46 PM EDT by a video enabled telemedicine application and verified that I am speaking with the correct person.   I discussed the limitations of evaluation and management by telemedicine and the availability of in person appointments. The patient expressed understanding and agreed to proceed.   Details of the encounter are shown below.  HPI  [name] is a 50 y.o.-year-old female, returning for f/u for hypothyroidism. Last visit 7 mo ago.  Interim hx: We scheduled this appointment emergently after a TSH returning still very high. At this visit, she mentions a 30 pound weight loss since our last visit, and she has no complaints.  Reviewed history: Pt. has been dx with hypothyroidism in ~2005 >> started on Levothyroxine 112 mcg for "years", then increased up to 200 mcg.  She has a history of noncompliance with her levothyroxine, now taking it every day.  She was then on 150 mcg daily of LT4 but the TSH was very high when checked by PCP last summer and the LT4 dose was increased to 200 mcg daily at that time, however, patient recalls that at that time she had day vomiting 2/2 lap band too tight >> had it stretched since.  A TSH returned suppressed in 01/2019, after which the dose of levothyroxine was decreased  Pt is on levothyroxine 125 mcg daily, taken: - first thing in am - fasting - at least 30 min from b'fast - no Ca, Fe, MVI, + PPIs more than 4 hours after levothyroxine - not on Biotin now  Reviewed her TFTs: Lab Results  Component Value Date   TSH 26.17 (H) 05/31/2020   TSH 20.09 (H) 04/04/2020   TSH 0.64 07/02/2019   TSH 0.20 (L) 05/13/2019   TSH 0.48 03/11/2019   TSH 0.25 (L) 01/25/2019    TSH 52.63 (H) 09/25/2018   TSH 0.91 04/06/2018   TSH 3.03 04/15/2017   TSH 1.50 03/14/2017   FREET4 0.77 05/31/2020   FREET4 0.80 04/04/2020   FREET4 1.09 07/02/2019   FREET4 1.33 05/13/2019   FREET4 1.40 03/11/2019   FREET4 1.28 04/06/2018   FREET4 1.29 03/14/2017   FREET4 0.90 06/18/2016   FREET4 2.0 (H) 04/19/2016   FREET4 1.99 (H) 03/15/2016   Reviewed the report of her thyroid ultrasound: Thyroid U/S (02/20/2006): Heterogeneous thyroid, no nodules  Pt denies: - feeling nodules in neck - hoarseness - dysphagia - choking - SOB with lying down  She has + FH of thyroid disorders in: mother (nodule). No FH of thyroid cancer. No h/o radiation tx to head or neck.  No seaweed or kelp. No recent contrast studies. No herbal supplements. No Biotin use. No recent steroids use.   She has a history of lap band in 2009.  She lost approximately 100 pounds (from 300 pounds), then plateaued, but since then, she continues to lose weight.  Latest HbA1c : Lab Results  Component Value Date   HGBA1C 5.4 10/29/2019   HGBA1C 5.7 03/11/2019   HGBA1C 5.7 03/10/2018   She stopped beef and pork in 07/2019. Still eating chicken and also eats tofu.  ROS: Constitutional: no weight gain/no weight loss, no fatigue, no subjective hyperthermia, no subjective hypothermia Eyes: no blurry vision, no xerophthalmia ENT:  no sore throat, + see HPI Cardiovascular: no CP/no SOB/no palpitations/no leg swelling Respiratory: no cough/no SOB/no wheezing Gastrointestinal: no N/no V/no D/no C/no acid reflux Musculoskeletal: no muscle aches/no joint aches Skin: no rashes, no hair loss Neurological: no tremors/no numbness/no tingling/no dizziness  I reviewed pt's medications, allergies, PMH, social hx, family hx, and changes were documented in the history of present illness. Otherwise, unchanged from my initial visit note.   Past Medical History:  Diagnosis Date  . Contact lens/glasses fitting   .  Hypertension   . Thyroid disease    Past Surgical History:  Procedure Laterality Date  . ABDOMINAL HYSTERECTOMY  01/2010   [facility]  . CESAREAN SECTION  12/2003  . LAPAROSCOPIC GASTRIC BANDING  10/12/2007  . myomectomy     2x's. Patient unsure of date of procedure based on history form dated 11/10/2009.   Social History   Social History  . Marital Status: single    Spouse Name: N/A  . Number of Children: 1   Occupational History  . 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 Smoking status: Never Smoker   . Smokeless tobacco: Never Used  . Alcohol Use: No  . Drug Use: No  . Sexual Activity:    Partners: Male   Social History Narrative   Exercising---treadmill 4x a week   Current Outpatient Medications on File Prior to Visit  Medication Sig Dispense Refill  . Albuterol Sulfate (PROAIR RESPICLICK) 086 (90 Base) MCG/ACT AEPB Inhale 2 puffs into the lungs 4 (four) times daily as needed. 1 each 0  . amLODipine (NORVASC) 10 MG tablet TAKE 1 TABLET (10 MG TOTAL) BY MOUTH DAILY. NEEDS OV/FOLLOW UP 90 tablet 0  . hydrochlorothiazide (HYDRODIURIL) 25 MG tablet TAKE 1 TABLET BY MOUTH EVERY DAY 90 tablet 0  . levothyroxine (SYNTHROID) 125 MCG tablet Take 1 tablet (125 mcg total) by mouth daily before breakfast. 45 tablet 3  . omeprazole (PRILOSEC) 20 MG capsule TAKE 1 CAPSULE BY MOUTH EVERY DAY 90 capsule 3  . promethazine (PHENERGAN) 25 MG tablet TAKE 1 TABLET BY MOUTH EVERY 6 HOURS AS NEEDED FOR NAUSEA 30 tablet 1  . traMADol (ULTRAM) 50 MG tablet Take 1 tablet (50 mg total) by mouth every 8 (eight) hours as needed. 30 tablet 1   No current facility-administered medications on file prior to visit.   Allergies  Allergen Reactions  . Diflucan [Fluconazole] Swelling    Blister and swelling in mouth  . Penicillins Rash   Family History  Problem Relation Age of Onset  . Hypertension Mother   . Thyroid disease Mother   . Breast cancer Mother 89  . Hypertension Father   .  Hypertension Sister   . Cancer Maternal Aunt        colon   PE: wt 212 lbs There were no vitals taken for this visit. There is no height or weight on file to calculate BMI. Wt Readings from Last 3 Encounters:  12/27/19 234 lb 6.4 oz (106.3 kg)  10/29/19 245 lb (111.1 kg)  03/11/19 235 lb (106.6 kg)   Constitutional:  in NAD  The physical exam was not performed (virtual visit).  ASSESSMENT: 1. Hypothyroidism - poorly controlled - h/o noncompliance with LT4  2.  Elevated HbA1c  3.  Obesity class II  PLAN:  1. Patient with longstanding hypothyroidism, on levothyroxine therapy with previous noncompliance with the medication. A TSH obtained by PCP in 09/2018 was very high, at 52.6.  At that  time, she was on 150 mcg of levothyroxine daily.  The dose was increased to 200 mcg daily by PCP but the subsequent TSH was suppressed so her dose was decreased to 137 mcg daily in 01/2019 and 125 mcg daily 05/2019.  At last visit, a TSH was elevated but she was taking the levothyroxine later in the morning and we moved it first thing in the morning.  However, her TSH returned even higher afterwards: Lab Results  Component Value Date   TSH 26.17 (H) 05/31/2020  - we discussed about taking the thyroid hormone every day, with water, >30 minutes before breakfast, separated by >4 hours from acid reflux medications, calcium, iron, multivitamins. Pt. is taking it correctly. -Therefore, I advised her to increase her LT4 dose to 150 mcg daily and we will recheck her TFTs in 1.5 months.  2.  Elevated HbA1c -At last visit, HbA1c was better, at 5.4%. -She denies increased urination, thirst, or blurry vision -We will repeat her HbA1c at next visit, in 10/2020  3.  Obesity class II -Before last visit, she gained approximately 10 pounds -I previously recommended a plant-based diet-at last visit, she cut out work and beef but she was still having chicken, but decreasing this, also and eating more seafood.  She  was still eating cheese and bread and we discussed about healthier alternatives.  She was not snacking between meals.  -Since last visit, she lost more than 30 pounds!  Orders Placed This Encounter  Procedures  . T4, free  . TSH   Philemon Kingdom, MD PhD [facility] Endocrinology

## 2020-06-01 NOTE — Patient Instructions (Signed)
Please continue Levothyroxine 125 mcg daily.  Take the thyroid hormone every day, with water, at least 30 minutes before breakfast, separated by at least 4 hours from: - acid reflux medications - calcium - iron - multivitamins  Please come back for a follow-up appointment in 5 months.

## 2020-06-22 ENCOUNTER — Other Ambulatory Visit (INDEPENDENT_AMBULATORY_CARE_PROVIDER_SITE_OTHER): Payer: No Typology Code available for payment source

## 2020-06-22 ENCOUNTER — Other Ambulatory Visit: Payer: Self-pay

## 2020-06-22 ENCOUNTER — Other Ambulatory Visit: Payer: Self-pay | Admitting: Internal Medicine

## 2020-06-22 DIAGNOSIS — E039 Hypothyroidism, unspecified: Secondary | ICD-10-CM

## 2020-06-22 LAB — TSH: TSH: 11.46 u[IU]/mL — ABNORMAL HIGH (ref 0.35–4.50)

## 2020-06-22 LAB — T4, FREE: Free T4: 0.91 ng/dL (ref 0.60–1.60)

## 2020-06-22 MED ORDER — LEVOTHYROXINE SODIUM 175 MCG PO TABS: 175.0000 ug | ORAL_TABLET | Freq: Every day | ORAL | 3 refills | Status: DC

## 2020-07-12 ENCOUNTER — Other Ambulatory Visit: Payer: Self-pay | Admitting: Family Medicine

## 2020-07-12 ENCOUNTER — Other Ambulatory Visit: Payer: Self-pay

## 2020-07-12 ENCOUNTER — Ambulatory Visit
Admission: RE | Admit: 2020-07-12 | Discharge: 2020-07-12 | Disposition: A | Discharge status: Home / Self Care | Payer: No Typology Code available for payment source | Source: Ambulatory Visit | Attending: Obstetrics and Gynecology | Admitting: Obstetrics and Gynecology

## 2020-07-12 DIAGNOSIS — Z1231 Encounter for screening mammogram for malignant neoplasm of breast: Secondary | ICD-10-CM

## 2020-07-12 DIAGNOSIS — I10 Essential (primary) hypertension: Secondary | ICD-10-CM

## 2020-08-04 ENCOUNTER — Other Ambulatory Visit: Payer: Self-pay | Admitting: Family Medicine

## 2020-08-04 DIAGNOSIS — I10 Essential (primary) hypertension: Secondary | ICD-10-CM

## 2020-08-14 ENCOUNTER — Other Ambulatory Visit (INDEPENDENT_AMBULATORY_CARE_PROVIDER_SITE_OTHER): Payer: No Typology Code available for payment source

## 2020-08-14 ENCOUNTER — Other Ambulatory Visit: Payer: Self-pay

## 2020-08-14 DIAGNOSIS — E039 Hypothyroidism, unspecified: Secondary | ICD-10-CM

## 2020-08-14 LAB — T4, FREE: Free T4: 1.04 ng/dL (ref 0.60–1.60)

## 2020-08-14 LAB — TSH: TSH: 4.9 u[IU]/mL — ABNORMAL HIGH (ref 0.35–4.50)

## 2020-08-15 ENCOUNTER — Other Ambulatory Visit: Payer: Self-pay | Admitting: Internal Medicine

## 2020-08-15 DIAGNOSIS — E039 Hypothyroidism, unspecified: Secondary | ICD-10-CM

## 2020-10-03 ENCOUNTER — Other Ambulatory Visit: Payer: Self-pay | Admitting: Family Medicine

## 2020-10-03 DIAGNOSIS — I10 Essential (primary) hypertension: Secondary | ICD-10-CM

## 2020-10-27 ENCOUNTER — Encounter: Payer: Self-pay | Admitting: Family Medicine

## 2020-10-27 DIAGNOSIS — I10 Essential (primary) hypertension: Secondary | ICD-10-CM

## 2020-10-30 ENCOUNTER — Other Ambulatory Visit: Payer: Self-pay | Admitting: Family Medicine

## 2020-10-30 DIAGNOSIS — I10 Essential (primary) hypertension: Secondary | ICD-10-CM

## 2020-10-30 MED ORDER — HYDROCHLOROTHIAZIDE 25 MG PO TABS: 25.0000 mg | ORAL_TABLET | Freq: Every day | ORAL | 0 refills | Status: DC

## 2020-10-31 ENCOUNTER — Other Ambulatory Visit: Payer: Self-pay

## 2020-10-31 ENCOUNTER — Ambulatory Visit (INDEPENDENT_AMBULATORY_CARE_PROVIDER_SITE_OTHER): Payer: No Typology Code available for payment source | Admitting: Internal Medicine

## 2020-10-31 ENCOUNTER — Encounter: Payer: Self-pay | Admitting: Internal Medicine

## 2020-10-31 VITALS — BP 120/78 | HR 96 | Ht 66.0 in | Wt 221.0 lb

## 2020-10-31 DIAGNOSIS — R7309 Other abnormal glucose: Secondary | ICD-10-CM | POA: Diagnosis not present

## 2020-10-31 DIAGNOSIS — E039 Hypothyroidism, unspecified: Secondary | ICD-10-CM

## 2020-10-31 DIAGNOSIS — E669 Obesity, unspecified: Secondary | ICD-10-CM | POA: Diagnosis not present

## 2020-10-31 LAB — POCT GLYCOSYLATED HEMOGLOBIN (HGB A1C): Hemoglobin A1C: 5.3 % (ref 4.0–5.6)

## 2020-10-31 LAB — T4, FREE: Free T4: 0.83 ng/dL (ref 0.60–1.60)

## 2020-10-31 LAB — TSH: TSH: 3.38 u[IU]/mL (ref 0.35–5.50)

## 2020-10-31 NOTE — Progress Notes (Signed)
Patient ID: [name], female   DOB: 21-Nov-1970, 50 y.o.   MRN: [id number]  This visit occurred during the SARS-CoV-2 public health emergency.  Safety protocols were in place, including screening questions prior to the visit, additional usage of staff PPE, and extensive cleaning of exam room while observing appropriate contact time as indicated for disinfecting solutions.   HPI  [name] is a 50 y.o.-year-old female, returning for f/u for hypothyroidism and history of elevated HbA1c. Last visit 5 months ago (virtual).  Interim hx: No increased urination, blurry vision, nausea, chest pain. She had some weight gain as she relaxed her diet since last visit  Reviewed and addended history: Pt. has been dx with hypothyroidism in ~2005 >> started on Levothyroxine 112 mcg for "years", then increased up to 200 mcg.  She has a history of noncompliance with her levothyroxine, now taking it every day.  She was then on 150 mcg daily of LT4 but the TSH was very high when checked by PCP last summer and the LT4 dose was increased to 200 mcg daily at that time, however, patient recalls that at that time she had day vomiting 2/2 lap band too tight >> had it stretched since.  A TSH returned suppressed in 01/2019, after which the dose of levothyroxine was decreased.  However, we then need to increase the dose again as TSH increased.  Pt is on levothyroxine 175 mcg daily, taken: - first thing in am - fasting - at least 30 min from b'fast or has a brunch - no Ca, Fe, MVI, + PPIs more than 4 hours after levothyroxine - not on Biotin now  Reviewed her TFTs: Lab Results  Component Value Date   TSH 4.90 (H) 08/14/2020   TSH 11.46 (H) 06/22/2020   TSH 26.17 (H) 05/31/2020   TSH 20.09 (H) 04/04/2020   TSH 0.64 07/02/2019   TSH 0.20 (L) 05/13/2019   TSH 0.48 03/11/2019   TSH 0.25 (L) 01/25/2019   TSH 52.63 (H) 09/25/2018   TSH 0.91 04/06/2018   FREET4 1.04 08/14/2020   FREET4 0.91 06/22/2020    FREET4 0.77 05/31/2020   FREET4 0.80 04/04/2020   FREET4 1.09 07/02/2019   FREET4 1.33 05/13/2019   FREET4 1.40 03/11/2019   FREET4 1.28 04/06/2018   FREET4 1.29 03/14/2017   FREET4 0.90 06/18/2016   Reviewed the report of her thyroid ultrasound: Thyroid U/S (02/20/2006): Heterogeneous thyroid, no nodules  Pt denies: - feeling nodules in neck - hoarseness - dysphagia - choking - SOB with lying down  She has + FH of thyroid disorders in: mother (nodule). No FH of thyroid cancer. No h/o radiation tx to head or neck.  No herbal supplements. No Biotin use. No recent steroids use.   She has a history of lap band in 2009.  She lost approximately 100 pounds (from 300 pounds), then plateaued, but since then, she restarted losing weight.  Latest HbA1c : Lab Results  Component Value Date   HGBA1C 5.4 10/29/2019   HGBA1C 5.7 03/11/2019   HGBA1C 5.7 03/10/2018   She stopped beef and pork in 07/2019.  ROS: Constitutional: + weight gain/no weight loss, no fatigue, no subjective hyperthermia, no subjective hypothermia Eyes: no blurry vision, no xerophthalmia ENT: no sore throat, + see HPI Cardiovascular: no CP/no SOB/no palpitations/no leg swelling Respiratory: no cough/no SOB/no wheezing Gastrointestinal: no N/no V/no D/no C/no acid reflux Musculoskeletal: no muscle aches/no joint aches Skin: no rashes, no hair loss Neurological: no tremors/no numbness/no tingling/no  dizziness  I reviewed pt's medications, allergies, PMH, social hx, family hx, and changes were documented in the history of present illness. Otherwise, unchanged from my initial visit note.   Past Medical History:  Diagnosis Date   Contact lens/glasses fitting    Hypertension    Thyroid disease    Past Surgical History:  Procedure Laterality Date   ABDOMINAL HYSTERECTOMY  01/2010   [facility]ah   BREAST BIOPSY Left 12/22/220   FIBROCYSTIC CHANGES INCLUDING APOCRINE METAPLASIA   CESAREAN SECTION  12/2003    LAPAROSCOPIC GASTRIC BANDING  10/12/2007   myomectomy     2x's. Patient unsure of date of procedure based on history form dated 11/10/2009.   Social History   Social History   Marital Status: single    Spouse Name: N/A   Number of Children: 1   Occupational History   Customer svc supervisor   Social History Main Topics   Smoking status: Never Smoker    Smokeless tobacco: Never Used   Alcohol Use: No   Drug Use: No   Sexual Activity:    Partners: Male   Social History Narrative   Exercising---treadmill 4x a week   Current Outpatient Medications on File Prior to Visit  Medication Sig Dispense Refill   Albuterol Sulfate (PROAIR RESPICLICK) 123XX123 (90 Base) MCG/ACT AEPB Inhale 2 puffs into the lungs 4 (four) times daily as needed. 1 each 0   amLODipine (NORVASC) 10 MG tablet TAKE 1 TABLET (10 MG TOTAL) BY MOUTH DAILY. NEEDS OV/FOLLOW UP 90 tablet 0   hydrochlorothiazide (HYDRODIURIL) 25 MG tablet Take 1 tablet (25 mg total) by mouth daily. 90 tablet 0   levothyroxine (SYNTHROID) 175 MCG tablet TAKE 1 TABLET BY MOUTH EVERY DAY 90 tablet 0   omeprazole (PRILOSEC) 20 MG capsule TAKE 1 CAPSULE BY MOUTH EVERY DAY 90 capsule 3   promethazine (PHENERGAN) 25 MG tablet TAKE 1 TABLET BY MOUTH EVERY 6 HOURS AS NEEDED FOR NAUSEA 30 tablet 1   traMADol (ULTRAM) 50 MG tablet Take 1 tablet (50 mg total) by mouth every 8 (eight) hours as needed. 30 tablet 1   No current facility-administered medications on file prior to visit.   Allergies  Allergen Reactions   Diflucan [Fluconazole] Swelling    Blister and swelling in mouth   Penicillins Rash   Family History  Problem Relation Age of Onset   Hypertension Mother    Thyroid disease Mother    Breast cancer Mother 13   Hypertension Father    Hypertension Sister    Cancer Maternal Aunt        colon   PE:  BP 120/78 (BP Location: Right Arm, Patient Position: Sitting, Cuff Size: Normal)   Pulse 96   Ht '5\' 6"'$  (1.676 m)   Wt 221 lb (100.2 kg)    SpO2 97%   BMI 35.67 kg/m  Body mass index is 35.67 kg/m. Wt Readings from Last 3 Encounters:  10/31/20 221 lb (100.2 kg)  12/27/19 234 lb 6.4 oz (106.3 kg)  10/29/19 245 lb (111.1 kg)   Constitutional: overweight, in NAD Eyes: PERRLA, EOMI, no exophthalmos ENT: moist mucous membranes, no thyromegaly, no cervical lymphadenopathy Cardiovascular: tachycardia, RR, No MRG Respiratory: CTA B Gastrointestinal: abdomen soft, NT, ND, BS+ Musculoskeletal: no deformities, strength intact in all 4 Skin: moist, warm, no rashes Neurological: no tremor with outstretched hands, DTR normal in all 4  ASSESSMENT: 1. Hypothyroidism - poorly controlled - h/o noncompliance with LT4  2.  Elevated HbA1c  3.  Obesity class II  PLAN:  1. Patient with longstanding hypothyroidism, on levothyroxine therapy with previous noncompliance with medication - A TSH obtained by PCP in 09/2018 was very high, at 52.6.  At that time, she was on 150 mcg of levothyroxine daily.  The dose was increased to 200 mcg daily by PCP but subsequent TSH was suppressed so her dose was decreased to 137 mcg daily in 01/2019 and 125 mcg daily 05/2019.  After this, TSH was still elevated but she was taking the levothyroxine later in the morning and we moved it first thing in the morning.  However, TSH returned higher afterwards so we have been increasing the dose of levothyroxine gradually.  Latest TSH was better, but still above target: Lab Results  Component Value Date   TSH 4.90 (H) 08/14/2020  - she continues on LT4 175 mcg daily (increased 06/2020) - pt feels good on this dose. - we discussed about taking the thyroid hormone every day, with water, >30 minutes before breakfast, separated by >4 hours from acid reflux medications, calcium, iron, multivitamins. Pt. is taking it correctly. - will check thyroid tests today: TSH and fT4 - If labs are abnormal, she will need to return for repeat TFTs in 1.5 months  2.  Elevated  HbA1c -Reviewed latest HbA1c and this was normal, at 5.4%. -No increased thirst, urination, or blurry vision -We repeated an HbA1c level today: lower: 5.3%  3.  Obesity class II -She continues on a modified plant-based diet, including seafood and chicken.  Before last visit, she was also eating cheese and bread and we discussed about healthier alternatives. - Before last visit, she lost more than 30 pounds (reported weight: 212 pounds - virtual appt).  Previously gained approximately 10 pounds. -Since our last visit she gained 9 pounds after she relaxed her diet  Needs 90 day refills.  Component     Latest Ref Rng & Units 10/31/2020  TSH     0.35 - 5.50 uIU/mL 3.38  T4,Free(Direct)     0.60 - 1.60 ng/dL 0.83  Hemoglobin A1C     4.0 - 5.6 % 5.3  TFTs are normal >> we will continue current dose of levothyroxine and repeat the thyroid tests in 3 months.  Philemon Kingdom, MD PhD [facility]

## 2020-10-31 NOTE — Patient Instructions (Signed)
Please continue Levothyroxine 175 mcg daily.  Take the thyroid hormone every day, with water, at least 30 minutes before breakfast, separated by at least 4 hours from: - acid reflux medications - calcium - iron - multivitamins  Please stop at the lab.  Please come back for a follow-up appointment in 6 months, but sooner for labs.

## 2020-11-01 MED ORDER — LEVOTHYROXINE SODIUM 175 MCG PO TABS: 175.0000 ug | ORAL_TABLET | Freq: Every day | ORAL | 3 refills | Status: DC

## 2020-11-02 ENCOUNTER — Other Ambulatory Visit: Payer: Self-pay

## 2020-11-02 ENCOUNTER — Ambulatory Visit (INDEPENDENT_AMBULATORY_CARE_PROVIDER_SITE_OTHER): Payer: No Typology Code available for payment source | Admitting: Family Medicine

## 2020-11-02 ENCOUNTER — Encounter: Payer: Self-pay | Admitting: Family Medicine

## 2020-11-02 VITALS — BP 124/86 | HR 76 | Temp 98.9°F | Resp 18 | Ht 65.0 in | Wt 218.2 lb

## 2020-11-02 DIAGNOSIS — Z1211 Encounter for screening for malignant neoplasm of colon: Secondary | ICD-10-CM

## 2020-11-02 DIAGNOSIS — I10 Essential (primary) hypertension: Secondary | ICD-10-CM

## 2020-11-02 DIAGNOSIS — K219 Gastro-esophageal reflux disease without esophagitis: Secondary | ICD-10-CM | POA: Diagnosis not present

## 2020-11-02 DIAGNOSIS — M1712 Unilateral primary osteoarthritis, left knee: Secondary | ICD-10-CM | POA: Diagnosis not present

## 2020-11-02 DIAGNOSIS — M25561 Pain in right knee: Secondary | ICD-10-CM

## 2020-11-02 DIAGNOSIS — E039 Hypothyroidism, unspecified: Secondary | ICD-10-CM

## 2020-11-02 MED ORDER — HYDROCHLOROTHIAZIDE 25 MG PO TABS: 25.0000 mg | ORAL_TABLET | Freq: Every day | ORAL | 1 refills | Status: DC

## 2020-11-02 MED ORDER — OMEPRAZOLE 20 MG PO CPDR: DELAYED_RELEASE_CAPSULE | ORAL | 3 refills | Status: DC

## 2020-11-02 MED ORDER — TRAMADOL HCL 50 MG PO TABS: 50.0000 mg | ORAL_TABLET | Freq: Three times a day (TID) | ORAL | 1 refills | Status: DC | PRN

## 2020-11-02 MED ORDER — AMLODIPINE BESYLATE 10 MG PO TABS: 10.0000 mg | ORAL_TABLET | Freq: Every day | ORAL | 1 refills | Status: DC

## 2020-11-02 NOTE — Assessment & Plan Note (Signed)
Well controlled, no changes to meds. Encouraged heart healthy diet such as the DASH diet and exercise as tolerated.  °

## 2020-11-02 NOTE — Assessment & Plan Note (Signed)
Encourage heart healthy diet such as MIND or DASH diet, increase exercise, avoid trans fats, simple carbohydrates and processed foods, consider a krill or fish or flaxseed oil cap daily.  °

## 2020-11-02 NOTE — Assessment & Plan Note (Signed)
Check tsh 

## 2020-11-02 NOTE — Patient Instructions (Signed)

## 2020-11-02 NOTE — Progress Notes (Signed)
Subjective:   By signing my name below, I, Shehryar Baig, attest that this documentation has been prepared under the direction and in the presence of Dr. Roma Schanz, DO. 11/02/2020    Patient ID: [name], female    DOB: [birth year], 50 y.o.   MRN: [id number]  Chief Complaint  Patient presents with   Hypertension   Follow-up    Hypertension Pertinent negatives include no blurred vision, chest pain, headaches, malaise/fatigue, palpitations or shortness of breath.  Patient is in today for a office visit. She complains of left knee pain that worsens while working out or walking long distance. She takes 50 mg tramadol prn to manage her pain and finds relief. She has tried steroid injections in the past but reports having mild improvement to her pain. She has also tried OTC tylenol to manage her symptoms and found slight improvement. She is willing to see a orthopedist specialist to manage her pain.  She is requesting a refill for 10 mg amlodipine daily PO, 25 mg hydrochlorothiazide daily PO, 50 mg tramadol PRN, 20 mg Prilosec daily PO.  Her blood pressure is doing well during this visit. She continues taking 10 mg amlodipine daily PO, 25 mg hydrochlorothiazide daily PO and reports no new issues while taking them.   BP Readings from Last 3 Encounters:  11/02/20 124/86  10/31/20 120/78  12/27/19 120/86   Pulse Readings from Last 3 Encounters:  11/02/20 76  10/31/20 96  12/27/19 88   She is getting her lab work during this visit. She has gotten her thyroid and a1c levels checked recently with her endocrinologist. She reports her last a1c is 5.3.  She is not interested in getting a flu vaccine during this visit. She reports having 3 pfizer Covid-19 vaccines at this time.    Past Medical History:  Diagnosis Date   Contact lens/glasses fitting    Hypertension    Thyroid disease     Past Surgical History:  Procedure Laterality Date   ABDOMINAL HYSTERECTOMY  01/2010    Tah   BREAST BIOPSY Left 12/22/220   FIBROCYSTIC CHANGES INCLUDING APOCRINE METAPLASIA   CESAREAN SECTION  12/2003   LAPAROSCOPIC GASTRIC BANDING  10/12/2007   myomectomy     2x's. Patient unsure of date of procedure based on history form dated 11/10/2009.    Family History  Problem Relation Age of Onset   Hypertension Mother    Thyroid disease Mother    Breast cancer Mother 39   Hypertension Father    Hypertension Sister    Cancer Maternal Aunt        colon    Social History   Socioeconomic History   Marital status: Divorced    Spouse name: Not on file   Number of children: Not on file   Years of education: Not on file   Highest education level: Not on file  Occupational History   Not on file  Tobacco Use   Smoking status: Never   Smokeless tobacco: Never  Substance and Sexual Activity   Alcohol use: No   Drug use: No   Sexual activity: Not on file  Other Topics Concern   Not on file  Social History Narrative   Exercising---treadmill 2x a week   Social Determinants of Health   Financial Resource Strain: Not on file  Food Insecurity: Not on file  Transportation Needs: Not on file  Physical Activity: Not on file  Stress: Not on file  Social Connections: Not on file  Intimate Partner Violence: Not on file    Outpatient Medications Prior to Visit  Medication Sig Dispense Refill   Albuterol Sulfate (PROAIR RESPICLICK) 123XX123 (90 Base) MCG/ACT AEPB Inhale 2 puffs into the lungs 4 (four) times daily as needed. 1 each 0   levothyroxine (SYNTHROID) 175 MCG tablet Take 1 tablet (175 mcg total) by mouth daily. 90 tablet 3   promethazine (PHENERGAN) 25 MG tablet TAKE 1 TABLET BY MOUTH EVERY 6 HOURS AS NEEDED FOR NAUSEA 30 tablet 1   amLODipine (NORVASC) 10 MG tablet TAKE 1 TABLET (10 MG TOTAL) BY MOUTH DAILY. NEEDS OV/FOLLOW UP 30 tablet 0   hydrochlorothiazide (HYDRODIURIL) 25 MG tablet Take 1 tablet (25 mg total) by mouth daily. 90 tablet 0   omeprazole (PRILOSEC) 20  MG capsule TAKE 1 CAPSULE BY MOUTH EVERY DAY 90 capsule 3   traMADol (ULTRAM) 50 MG tablet Take 1 tablet (50 mg total) by mouth every 8 (eight) hours as needed. 30 tablet 1   No facility-administered medications prior to visit.    Allergies  Allergen Reactions   Diflucan [Fluconazole] Swelling    Blister and swelling in mouth   Penicillins Rash    Review of Systems  Constitutional:  Negative for fever and malaise/fatigue.  HENT:  Negative for congestion.   Eyes:  Negative for blurred vision.  Respiratory:  Negative for shortness of breath.   Cardiovascular:  Negative for chest pain, palpitations and leg swelling.  Gastrointestinal:  Negative for abdominal pain, blood in stool and nausea.  Genitourinary:  Negative for dysuria and frequency.  Musculoskeletal:  Positive for joint pain (Left knee pain). Negative for falls.  Skin:  Negative for rash.  Neurological:  Negative for dizziness, loss of consciousness and headaches.  Endo/Heme/Allergies:  Negative for environmental allergies.  Psychiatric/Behavioral:  Negative for depression. The patient is not nervous/anxious.       Objective:    Physical Exam Vitals and nursing note reviewed.  Constitutional:      Appearance: She is well-developed.  HENT:     Head: Normocephalic and atraumatic.  Eyes:     Conjunctiva/sclera: Conjunctivae normal.  Neck:     Thyroid: No thyromegaly.     Vascular: No carotid bruit or JVD.  Cardiovascular:     Rate and Rhythm: Normal rate and regular rhythm.     Heart sounds: Normal heart sounds. No murmur heard. Pulmonary:     Effort: Pulmonary effort is normal. No respiratory distress.     Breath sounds: Normal breath sounds. No wheezing or rales.  Chest:     Chest wall: No tenderness.  Musculoskeletal:     Cervical back: Normal range of motion and neck supple.  Neurological:     Mental Status: She is alert and oriented to person, place, and time.    BP 124/86 (BP Location: Right Arm,  Patient Position: Sitting, Cuff Size: Normal)   Pulse 76   Temp 98.9 F (37.2 C) (Oral)   Resp 18   Ht '5\' 5"'$  (1.651 m)   Wt 218 lb 3.2 oz (99 kg)   SpO2 97%   BMI 36.31 kg/m  Wt Readings from Last 3 Encounters:  11/02/20 218 lb 3.2 oz (99 kg)  10/31/20 221 lb (100.2 kg)  12/27/19 234 lb 6.4 oz (106.3 kg)    Diabetic Foot Exam - Simple   No data filed    Lab Results  Component Value Date   WBC 3.8 12/27/2019   HGB 12.7 12/27/2019   HCT 39.3  12/27/2019   PLT 258 12/27/2019   GLUCOSE 91 12/27/2019   CHOL 307 (H) 12/27/2019   TRIG 118 12/27/2019   HDL 83 12/27/2019   LDLDIRECT 116.6 03/14/2011   LDLCALC 198 (H) 12/27/2019   ALT 12 12/27/2019   AST 17 12/27/2019   NA 142 12/27/2019   K 3.4 (L) 12/27/2019   CL 100 12/27/2019   CREATININE 0.95 12/27/2019   BUN 20 12/27/2019   CO2 28 12/27/2019   TSH 3.38 10/31/2020   HGBA1C 5.3 10/31/2020    Lab Results  Component Value Date   TSH 3.38 10/31/2020   Lab Results  Component Value Date   WBC 3.8 12/27/2019   HGB 12.7 12/27/2019   HCT 39.3 12/27/2019   MCV 86.4 12/27/2019   PLT 258 12/27/2019   Lab Results  Component Value Date   NA 142 12/27/2019   K 3.4 (L) 12/27/2019   CO2 28 12/27/2019   GLUCOSE 91 12/27/2019   BUN 20 12/27/2019   CREATININE 0.95 12/27/2019   BILITOT 0.5 12/27/2019   ALKPHOS 92 01/25/2019   AST 17 12/27/2019   ALT 12 12/27/2019   PROT 7.4 12/27/2019   ALBUMIN 3.8 01/25/2019   CALCIUM 10.1 12/27/2019   GFR 108.42 01/25/2019   Lab Results  Component Value Date   CHOL 307 (H) 12/27/2019   Lab Results  Component Value Date   HDL 83 12/27/2019   Lab Results  Component Value Date   LDLCALC 198 (H) 12/27/2019   Lab Results  Component Value Date   TRIG 118 12/27/2019   Lab Results  Component Value Date   CHOLHDL 3.7 12/27/2019   Lab Results  Component Value Date   HGBA1C 5.3 10/31/2020       Assessment & Plan:   Problem List Items Addressed This Visit        Unprioritized   Essential hypertension    Well controlled, no changes to meds. Encouraged heart healthy diet such as the DASH diet and exercise as tolerated.       Relevant Medications   amLODipine (NORVASC) 10 MG tablet   hydrochlorothiazide (HYDRODIURIL) 25 MG tablet   Hypothyroidism    Check tsh      Other Visit Diagnoses     Primary hypertension    -  Primary   Relevant Medications   amLODipine (NORVASC) 10 MG tablet   hydrochlorothiazide (HYDRODIURIL) 25 MG tablet   Other Relevant Orders   Lipid panel   Comprehensive metabolic panel   Gastroesophageal reflux disease       Relevant Medications   omeprazole (PRILOSEC) 20 MG capsule   Acute pain of right knee       Relevant Medications   traMADol (ULTRAM) 50 MG tablet   Primary osteoarthritis of left knee       Relevant Medications   traMADol (ULTRAM) 50 MG tablet   Other Relevant Orders   Ambulatory referral to Orthopedic Surgery   Colon cancer screening       Relevant Orders   Ambulatory referral to Gastroenterology        Meds ordered this encounter  Medications   amLODipine (NORVASC) 10 MG tablet    Sig: Take 1 tablet (10 mg total) by mouth daily. NEEDS OV/FOLLOW UP    Dispense:  90 tablet    Refill:  1   hydrochlorothiazide (HYDRODIURIL) 25 MG tablet    Sig: Take 1 tablet (25 mg total) by mouth daily.    Dispense:  90 tablet  Refill:  1   omeprazole (PRILOSEC) 20 MG capsule    Sig: TAKE 1 CAPSULE BY MOUTH EVERY DAY    Dispense:  90 capsule    Refill:  3   traMADol (ULTRAM) 50 MG tablet    Sig: Take 1 tablet (50 mg total) by mouth every 8 (eight) hours as needed.    Dispense:  30 tablet    Refill:  1    This request is for a new prescription for a controlled substance as required by Federal/State law.Meda Coffee, Dr. Roma Schanz, DO, personally preformed the services described in this documentation.  All medical record entries made by the scribe were at my direction and in my presence.  I have  reviewed the chart and discharge instructions (if applicable) and agree that the record reflects my personal performance and is accurate and complete. 11/02/2020   I,Shehryar Baig,acting as a scribe for Ann Held, DO.,have documented all relevant documentation on the behalf of Ann Held, DO,as directed by  Ann Held, DO while in the presence of Ann Held, DO.   Ann Held, DO

## 2020-11-03 LAB — COMPREHENSIVE METABOLIC PANEL
ALT: 9 U/L (ref 0–35)
AST: 14 U/L (ref 0–37)
Albumin: 4.2 g/dL (ref 3.5–5.2)
Alkaline Phosphatase: 91 U/L (ref 39–117)
BUN: 14 mg/dL (ref 6–23)
CO2: 31 mEq/L (ref 19–32)
Calcium: 10 mg/dL (ref 8.4–10.5)
Chloride: 102 mEq/L (ref 96–112)
Creatinine, Ser: 0.78 mg/dL (ref 0.40–1.20)
GFR: 89.45 mL/min (ref >60.00)
Glucose, Bld: 77 mg/dL (ref 70–99)
Potassium: 3.3 mEq/L — ABNORMAL LOW (ref 3.5–5.1)
Sodium: 141 mEq/L (ref 135–145)
Total Bilirubin: 0.5 mg/dL (ref 0.2–1.2)
Total Protein: 7.2 g/dL (ref 6.0–8.3)

## 2020-11-03 LAB — LIPID PANEL
Cholesterol: 254 mg/dL — ABNORMAL HIGH (ref 0–200)
HDL: 77.8 mg/dL (ref >39.00)
LDL Cholesterol: 155 mg/dL — ABNORMAL HIGH (ref 0–99)
NonHDL: 176.25
Total CHOL/HDL Ratio: 3
Triglycerides: 105 mg/dL (ref 0.0–149.0)
VLDL: 21 mg/dL (ref 0.0–40.0)

## 2020-11-29 ENCOUNTER — Encounter: Payer: Self-pay | Admitting: Family Medicine

## 2020-11-30 ENCOUNTER — Other Ambulatory Visit: Payer: Self-pay | Admitting: Family Medicine

## 2020-11-30 DIAGNOSIS — E876 Hypokalemia: Secondary | ICD-10-CM

## 2020-11-30 MED ORDER — POTASSIUM CHLORIDE CRYS ER 20 MEQ PO TBCR
20.0000 meq | EXTENDED_RELEASE_TABLET | Freq: Every day | ORAL | 3 refills | Status: DC
Start: 2020-11-30 — End: 2021-04-02

## 2020-12-08 ENCOUNTER — Encounter: Payer: Self-pay | Admitting: Family Medicine

## 2020-12-08 DIAGNOSIS — J9801 Acute bronchospasm: Secondary | ICD-10-CM

## 2020-12-08 MED ORDER — PROAIR RESPICLICK 108 (90 BASE) MCG/ACT IN AEPB
2.0000 | INHALATION_SPRAY | Freq: Four times a day (QID) | RESPIRATORY_TRACT | 2 refills | Status: DC | PRN
Start: 2020-12-08 — End: 2020-12-13

## 2020-12-12 ENCOUNTER — Telehealth: Payer: Self-pay | Admitting: *Deleted

## 2020-12-12 NOTE — Telephone Encounter (Signed)
Insurance will not cover ProAir Respiclick.  It will cover regular ProAir.  Ok to change to [employer] with same directions?

## 2020-12-13 MED ORDER — ALBUTEROL SULFATE HFA 108 (90 BASE) MCG/ACT IN AERS: 2.0000 | INHALATION_SPRAY | RESPIRATORY_TRACT | 2 refills | Status: DC | PRN

## 2020-12-13 NOTE — Telephone Encounter (Signed)
Rx sent in for Proair

## 2020-12-15 ENCOUNTER — Other Ambulatory Visit: Payer: Self-pay | Admitting: Family Medicine

## 2020-12-15 MED ORDER — ALBUTEROL SULFATE HFA 108 (90 BASE) MCG/ACT IN AERS: 2.0000 | INHALATION_SPRAY | Freq: Four times a day (QID) | RESPIRATORY_TRACT | 5 refills | Status: AC | PRN

## 2020-12-28 ENCOUNTER — Encounter: Payer: No Typology Code available for payment source | Admitting: Family Medicine

## 2021-03-09 ENCOUNTER — Other Ambulatory Visit: Payer: Self-pay | Admitting: Family Medicine

## 2021-03-09 DIAGNOSIS — M25561 Pain in right knee: Secondary | ICD-10-CM

## 2021-03-09 NOTE — Telephone Encounter (Signed)
Requesting:tramadol 50mg   Contract: 12/27/2019 UDS: 12/27/2019 Last Visit: 11/02/2020 Next Visit: 05/08/2021 Last Refill: 11/02/2020 #30 and 1RF  Please Advise

## 2021-03-22 ENCOUNTER — Telehealth: Payer: Self-pay

## 2021-03-22 NOTE — Telephone Encounter (Signed)
PA initiated via Covermymeds; KEY:  [id number]. PA approved. Effective 03/22/21 to 09/18/2021.

## 2021-04-01 ENCOUNTER — Other Ambulatory Visit: Payer: Self-pay | Admitting: Family Medicine

## 2021-04-01 DIAGNOSIS — E876 Hypokalemia: Secondary | ICD-10-CM

## 2021-05-03 ENCOUNTER — Ambulatory Visit: Payer: No Typology Code available for payment source | Admitting: Internal Medicine

## 2021-05-08 ENCOUNTER — Ambulatory Visit: Payer: No Typology Code available for payment source | Admitting: Family Medicine

## 2021-05-10 ENCOUNTER — Encounter: Payer: Self-pay | Admitting: Family Medicine

## 2021-05-10 ENCOUNTER — Ambulatory Visit (INDEPENDENT_AMBULATORY_CARE_PROVIDER_SITE_OTHER): Payer: No Typology Code available for payment source | Admitting: Family Medicine

## 2021-05-10 VITALS — BP 118/80 | HR 89 | Temp 98.9°F | Resp 18 | Ht 65.0 in | Wt 212.4 lb

## 2021-05-10 DIAGNOSIS — E039 Hypothyroidism, unspecified: Secondary | ICD-10-CM

## 2021-05-10 DIAGNOSIS — E785 Hyperlipidemia, unspecified: Secondary | ICD-10-CM

## 2021-05-10 DIAGNOSIS — M25561 Pain in right knee: Secondary | ICD-10-CM | POA: Diagnosis not present

## 2021-05-10 DIAGNOSIS — K219 Gastro-esophageal reflux disease without esophagitis: Secondary | ICD-10-CM | POA: Diagnosis not present

## 2021-05-10 DIAGNOSIS — I10 Essential (primary) hypertension: Secondary | ICD-10-CM

## 2021-05-10 LAB — LIPID PANEL
Cholesterol: 222 mg/dL — ABNORMAL HIGH (ref 0–200)
HDL: 73.5 mg/dL (ref >39.00)
LDL Cholesterol: 134 mg/dL — ABNORMAL HIGH (ref 0–99)
NonHDL: 148.98
Total CHOL/HDL Ratio: 3
Triglycerides: 76 mg/dL (ref 0.0–149.0)
VLDL: 15.2 mg/dL (ref 0.0–40.0)

## 2021-05-10 LAB — COMPREHENSIVE METABOLIC PANEL
ALT: 9 U/L (ref 0–35)
AST: 12 U/L (ref 0–37)
Albumin: 4.2 g/dL (ref 3.5–5.2)
Alkaline Phosphatase: 121 U/L — ABNORMAL HIGH (ref 39–117)
BUN: 14 mg/dL (ref 6–23)
CO2: 32 mEq/L (ref 19–32)
Calcium: 9.9 mg/dL (ref 8.4–10.5)
Chloride: 102 mEq/L (ref 96–112)
Creatinine, Ser: 0.72 mg/dL (ref 0.40–1.20)
GFR: 98.11 mL/min (ref >60.00)
Glucose, Bld: 82 mg/dL (ref 70–99)
Potassium: 4 mEq/L (ref 3.5–5.1)
Sodium: 142 mEq/L (ref 135–145)
Total Bilirubin: 0.5 mg/dL (ref 0.2–1.2)
Total Protein: 6.9 g/dL (ref 6.0–8.3)

## 2021-05-10 LAB — CBC WITH DIFFERENTIAL/PLATELET
Basophils Absolute: 0 10*3/uL (ref 0.0–0.1)
Basophils Relative: 0.4 % (ref 0.0–3.0)
Eosinophils Absolute: 0.1 10*3/uL (ref 0.0–0.7)
Eosinophils Relative: 3 % (ref 0.0–5.0)
HCT: 37.1 % (ref 36.0–46.0)
Hemoglobin: 11.7 g/dL — ABNORMAL LOW (ref 12.0–15.0)
Lymphocytes Relative: 37.3 % (ref 12.0–46.0)
Lymphs Abs: 1.6 10*3/uL (ref 0.7–4.0)
MCHC: 31.5 g/dL (ref 30.0–36.0)
MCV: 86.6 fl (ref 78.0–100.0)
Monocytes Absolute: 0.4 10*3/uL (ref 0.1–1.0)
Monocytes Relative: 9.2 % (ref 3.0–12.0)
Neutro Abs: 2.1 10*3/uL (ref 1.4–7.7)
Neutrophils Relative %: 50.1 % (ref 43.0–77.0)
Platelets: 255 10*3/uL (ref 150.0–400.0)
RBC: 4.29 Mil/uL (ref 3.87–5.11)
RDW: 14.9 % (ref 11.5–15.5)
WBC: 4.2 10*3/uL (ref 4.0–10.5)

## 2021-05-10 LAB — TSH: TSH: 0.79 u[IU]/mL (ref 0.35–5.50)

## 2021-05-10 MED ORDER — OMEPRAZOLE 20 MG PO CPDR: DELAYED_RELEASE_CAPSULE | ORAL | 3 refills | Status: DC

## 2021-05-10 MED ORDER — HYDROCHLOROTHIAZIDE 25 MG PO TABS: 25.0000 mg | ORAL_TABLET | Freq: Every day | ORAL | 1 refills | Status: DC

## 2021-05-10 MED ORDER — AMLODIPINE BESYLATE 10 MG PO TABS: 10.0000 mg | ORAL_TABLET | Freq: Every day | ORAL | 1 refills | Status: DC

## 2021-05-10 MED ORDER — TRAMADOL HCL 50 MG PO TABS: 50.0000 mg | ORAL_TABLET | Freq: Three times a day (TID) | ORAL | 2 refills | Status: DC | PRN

## 2021-05-10 NOTE — Assessment & Plan Note (Signed)
Well controlled, no changes to meds. Encouraged heart healthy diet such as the DASH diet and exercise as tolerated.  °

## 2021-05-10 NOTE — Patient Instructions (Signed)
DASH Eating Plan °DASH stands for Dietary Approaches to Stop Hypertension. The DASH eating plan is a healthy eating plan that has been shown to: °Reduce high blood pressure (hypertension). °Reduce your risk for type 2 diabetes, heart disease, and stroke. °Help with weight loss. °What are tips for following this plan? °Reading food labels °Check food labels for the amount of salt (sodium) per serving. Choose foods with less than 5 percent of the Daily Value of sodium. Generally, foods with less than 300 milligrams (mg) of sodium per serving fit into this eating plan. °To find whole grains, look for the word "whole" as the first word in the ingredient list. °Shopping °Buy products labeled as "low-sodium" or "no salt added." °Buy fresh foods. Avoid canned foods and pre-made or frozen meals. °Cooking °Avoid adding salt when cooking. Use salt-free seasonings or herbs instead of table salt or sea salt. Check with your health care provider or pharmacist before using salt substitutes. °Do not fry foods. Cook foods using healthy methods such as baking, boiling, grilling, roasting, and broiling instead. °Cook with heart-healthy oils, such as olive, canola, avocado, soybean, or sunflower oil. °Meal planning ° °Eat a balanced diet that includes: °4 or more servings of fruits and 4 or more servings of vegetables each day. Try to fill one-half of your plate with fruits and vegetables. °6-8 servings of whole grains each day. °Less than 6 oz (170 g) of lean meat, poultry, or fish each day. A 3-oz (85-g) serving of meat is about the same size as a deck of cards. One egg equals 1 oz (28 g). °2-3 servings of low-fat dairy each day. One serving is 1 cup (237 mL). °1 serving of nuts, seeds, or beans 5 times each week. °2-3 servings of heart-healthy fats. Healthy fats called omega-3 fatty acids are found in foods such as walnuts, flaxseeds, fortified milks, and eggs. These fats are also found in cold-water fish, such as sardines, salmon,  and mackerel. °Limit how much you eat of: °Canned or prepackaged foods. °Food that is high in trans fat, such as some fried foods. °Food that is high in saturated fat, such as fatty meat. °Desserts and other sweets, sugary drinks, and other foods with added sugar. °Full-fat dairy products. °Do not salt foods before eating. °Do not eat more than 4 egg yolks a week. °Try to eat at least 2 vegetarian meals a week. °Eat more home-cooked food and less restaurant, buffet, and fast food. °Lifestyle °When eating at a restaurant, ask that your food be prepared with less salt or no salt, if possible. °If you drink alcohol: °Limit how much you use to: °0-1 drink a day for women who are not pregnant. °0-2 drinks a day for men. °Be aware of how much alcohol is in your drink. In the [place] one drink equals one 12 oz bottle of beer (355 mL), one 5 oz glass of wine (148 mL), or one 1½ oz glass of hard liquor (44 mL). °General information °Avoid eating more than 2,300 mg of salt a day. If you have hypertension, you may need to reduce your sodium intake to 1,500 mg a day. °Work with your health care provider to maintain a healthy body weight or to lose weight. Ask what an ideal weight is for you. °Get at least 30 minutes of exercise that causes your heart to beat faster (aerobic exercise) most days of the week. Activities may include walking, swimming, or biking. °Work with your health care provider or dietitian to   adjust your eating plan to your individual calorie needs. °What foods should I eat? °Fruits °All fresh, dried, or frozen fruit. Canned fruit in natural juice (without added sugar). °Vegetables °Fresh or frozen vegetables (raw, steamed, roasted, or grilled). Low-sodium or reduced-sodium tomato and vegetable juice. Low-sodium or reduced-sodium tomato sauce and tomato paste. Low-sodium or reduced-sodium canned vegetables. °Grains °Whole-grain or whole-wheat bread. Whole-grain or whole-wheat pasta. Brown rice. Oatmeal. Quinoa.  Bulgur. Whole-grain and low-sodium cereals. Pita bread. Low-fat, low-sodium crackers. Whole-wheat flour tortillas. °Meats and other proteins °Skinless chicken or turkey. Ground chicken or turkey. Pork with fat trimmed off. Fish and seafood. Egg whites. Dried beans, peas, or lentils. Unsalted nuts, nut butters, and seeds. Unsalted canned beans. Lean cuts of beef with fat trimmed off. Low-sodium, lean precooked or cured meat, such as sausages or meat loaves. °Dairy °Low-fat (1%) or fat-free (skim) milk. Reduced-fat, low-fat, or fat-free cheeses. Nonfat, low-sodium ricotta or cottage cheese. Low-fat or nonfat yogurt. Low-fat, low-sodium cheese. °Fats and oils °Soft margarine without trans fats. Vegetable oil. Reduced-fat, low-fat, or light mayonnaise and salad dressings (reduced-sodium). Canola, safflower, olive, avocado, soybean, and sunflower oils. Avocado. °Seasonings and condiments °Herbs. Spices. Seasoning mixes without salt. °Other foods °Unsalted popcorn and pretzels. Fat-free sweets. °The items listed above may not be a complete list of foods and beverages you can eat. Contact a dietitian for more information. °What foods should I avoid? °Fruits °Canned fruit in a light or heavy syrup. Fried fruit. Fruit in cream or butter sauce. °Vegetables °Creamed or fried vegetables. Vegetables in a cheese sauce. Regular canned vegetables (not low-sodium or reduced-sodium). Regular canned tomato sauce and paste (not low-sodium or reduced-sodium). Regular tomato and vegetable juice (not low-sodium or reduced-sodium). Pickles. Olives. °Grains °Baked goods made with fat, such as croissants, muffins, or some breads. Dry pasta or rice meal packs. °Meats and other proteins °Fatty cuts of meat. Ribs. Fried meat. Bacon. Bologna, salami, and other precooked or cured meats, such as sausages or meat loaves. Fat from the back of a pig (fatback). Bratwurst. Salted nuts and seeds. Canned beans with added salt. Canned or smoked fish.  Whole eggs or egg yolks. Chicken or turkey with skin. °Dairy °Whole or 2% milk, cream, and half-and-half. Whole or full-fat cream cheese. Whole-fat or sweetened yogurt. Full-fat cheese. Nondairy creamers. Whipped toppings. Processed cheese and cheese spreads. °Fats and oils °Butter. Stick margarine. Lard. Shortening. Ghee. Bacon fat. Tropical oils, such as coconut, palm kernel, or palm oil. °Seasonings and condiments °Onion salt, garlic salt, seasoned salt, table salt, and sea salt. Worcestershire sauce. Tartar sauce. Barbecue sauce. Teriyaki sauce. Soy sauce, including reduced-sodium. Steak sauce. Canned and packaged gravies. Fish sauce. Oyster sauce. Cocktail sauce. Store-bought horseradish. Ketchup. Mustard. Meat flavorings and tenderizers. Bouillon cubes. Hot sauces. Pre-made or packaged marinades. Pre-made or packaged taco seasonings. Relishes. Regular salad dressings. °Other foods °Salted popcorn and pretzels. °The items listed above may not be a complete list of foods and beverages you should avoid. Contact a dietitian for more information. °Where to find more information °National Heart, Lung, and Blood Institute: www.nhlbi.nih.gov °American Heart Association: www.heart.org °Academy of Nutrition and Dietetics: www.eatright.org °National Kidney Foundation: www.kidney.org °Summary °The DASH eating plan is a healthy eating plan that has been shown to reduce high blood pressure (hypertension). It may also reduce your risk for type 2 diabetes, heart disease, and stroke. °When on the DASH eating plan, aim to eat more fresh fruits and vegetables, whole grains, lean proteins, low-fat dairy, and heart-healthy fats. °With the DASH   eating plan, you should limit salt (sodium) intake to 2,300 mg a day. If you have hypertension, you may need to reduce your sodium intake to 1,500 mg a day. °Work with your health care provider or dietitian to adjust your eating plan to your individual calorie needs. °This information is not  intended to replace advice given to you by your health care provider. Make sure you discuss any questions you have with your health care provider. °Document Revised: 01/22/2019 Document Reviewed: 01/22/2019 °Elsevier Patient Education © 2022 [employer]. ° °

## 2021-05-10 NOTE — Progress Notes (Signed)
Subjective:   By signing my name below, I, Shehryar Baig, attest that this documentation has been prepared under the direction and in the presence of Ann Held, DO. 05/10/2021    Patient ID: [name], female    DOB: 1970-10-28, 51 y.o.   MRN: [id number]  Chief Complaint  Patient presents with   Hypertension   Hypothyroidism    Hypertension Pertinent negatives include no blurred vision, chest pain, headaches, malaise/fatigue, palpitations or shortness of breath.  Patient is in today for a office visit.   She is requesting a refill on 10 mg amlodipine daily PO, 20 mg Prilosec daily PO, 50 mg tramadol PRN.  Her knee pain has improved during this visit. She reports receiving an injection in both knees on September, 2022 that helped her pain. She continues taking Tramadol PRN and finds mild relief.  Her blood pressure is doing well during this visit. She continues taking 10 mg amlodipine daily PO, 25 mg hydrochlorothiazide daily PO and reports no new issues while taking them. She is requesting to stop taking her 25 mg hydrochlorothiazide. She was prescribed it when her blood pressure was elevated and since then she lost weight and reduced her blood pressure.  BP Readings from Last 3 Encounters:  05/10/21 118/80  11/02/20 124/86  10/31/20 120/78   Pulse Readings from Last 3 Encounters:  05/10/21 89  11/02/20 76  10/31/20 96   She continues exercising regularly by using a stationary bike or walking outside her neighborhood.  Wt Readings from Last 3 Encounters:  05/10/21 212 lb 6.4 oz (96.3 kg)  11/02/20 218 lb 3.2 oz (99 kg)  10/31/20 221 lb (100.2 kg)   She was not notified by her GI specialist of a colonoscopy. She has not set up an appointment but is interested in doing so.  She is due for a tetanus vaccine and is not interested in receiving it at this time. She is willing to receive it at another time. She has 3 pfizer Covid-19 vaccines at this time.    Past  Medical History:  Diagnosis Date   Contact lens/glasses fitting    Hypertension    Thyroid disease     Past Surgical History:  Procedure Laterality Date   ABDOMINAL HYSTERECTOMY  01/2010   [facility]   BREAST BIOPSY Left 12/22/220   FIBROCYSTIC CHANGES INCLUDING APOCRINE METAPLASIA   CESAREAN SECTION  12/2003   LAPAROSCOPIC GASTRIC BANDING  10/12/2007   myomectomy     2x's. Patient unsure of date of procedure based on history form dated 11/10/2009.    Family History  Problem Relation Age of Onset   Hypertension Mother    Thyroid disease Mother    Breast cancer Mother 40   Hypertension Father    Hypertension Sister    Cancer Maternal Aunt        colon    Social History   Socioeconomic History   Marital status: Divorced    Spouse name: Not on file   Number of children: Not on file   Years of education: Not on file   Highest education level: Not on file  Occupational History   Not on file  Tobacco Use   Smoking status: Never   Smokeless tobacco: Never  Substance and Sexual Activity   Alcohol use: No   Drug use: No   Sexual activity: Not on file  Other Topics Concern   Not on file  Social History Narrative   Exercising---treadmill 2x a week  Social Determinants of Health   Financial Resource Strain: Not on file  Food Insecurity: Not on file  Transportation Needs: Not on file  Physical Activity: Not on file  Stress: Not on file  Social Connections: Not on file  Intimate Partner Violence: Not on file    Outpatient Medications Prior to Visit  Medication Sig Dispense Refill   albuterol (VENTOLIN HFA) 108 (90 Base) MCG/ACT inhaler Inhale 2 puffs into the lungs every 6 (six) hours as needed for wheezing or shortness of breath. 8 g 5   levothyroxine (SYNTHROID) 175 MCG tablet Take 1 tablet (175 mcg total) by mouth daily. 90 tablet 3   promethazine (PHENERGAN) 25 MG tablet TAKE 1 TABLET BY MOUTH EVERY 6 HOURS AS NEEDED FOR NAUSEA 30 tablet 1   amLODipine (NORVASC) 10 MG  tablet Take 1 tablet (10 mg total) by mouth daily. NEEDS OV/FOLLOW UP 90 tablet 1   hydrochlorothiazide (HYDRODIURIL) 25 MG tablet Take 1 tablet (25 mg total) by mouth daily. 90 tablet 1   omeprazole (PRILOSEC) 20 MG capsule TAKE 1 CAPSULE BY MOUTH EVERY DAY 90 capsule 3   potassium chloride SA (KLOR-CON M20) 20 MEQ tablet TAKE 1 TABLET BY MOUTH EVERY DAY 90 tablet 1   traMADol (ULTRAM) 50 MG tablet TAKE 1 TABLET BY MOUTH EVERY 8 HOURS AS NEEDED. 28 tablet 2   No facility-administered medications prior to visit.    Allergies  Allergen Reactions   Diflucan [Fluconazole] Swelling    Blister and swelling in mouth   Penicillins Rash    Review of Systems  Constitutional:  Negative for fever and malaise/fatigue.  HENT:  Negative for congestion.   Eyes:  Negative for blurred vision.  Respiratory:  Negative for cough and shortness of breath.   Cardiovascular:  Negative for chest pain, palpitations and leg swelling.  Gastrointestinal:  Negative for vomiting.  Musculoskeletal:  Negative for back pain.  Skin:  Negative for rash.  Neurological:  Negative for loss of consciousness and headaches.      Objective:    Physical Exam Vitals and nursing note reviewed.  Constitutional:      General: She is not in acute distress.    Appearance: Normal appearance. She is well-developed. She is not ill-appearing.  HENT:     Head: Normocephalic and atraumatic.     Right Ear: External ear normal.     Left Ear: External ear normal.  Eyes:     Extraocular Movements: Extraocular movements intact.     Conjunctiva/sclera: Conjunctivae normal.     Pupils: Pupils are equal, round, and reactive to light.  Neck:     Thyroid: No thyromegaly.     Vascular: No carotid bruit or JVD.  Cardiovascular:     Rate and Rhythm: Normal rate and regular rhythm.     Heart sounds: Normal heart sounds. No murmur heard.   No gallop.  Pulmonary:     Effort: Pulmonary effort is normal. No respiratory distress.      Breath sounds: Normal breath sounds. No wheezing or rales.  Chest:     Chest wall: No tenderness.  Musculoskeletal:     Cervical back: Normal range of motion and neck supple.  Skin:    General: Skin is warm and dry.  Neurological:     Mental Status: She is alert and oriented to person, place, and time.  Psychiatric:        Judgment: Judgment normal.    BP 118/80 (BP Location: Right Arm, Patient Position: Sitting,  Cuff Size: Normal)    Pulse 89    Temp 98.9 F (37.2 C) (Oral)    Resp 18    Ht '5\' 5"'$  (1.651 m)    Wt 212 lb 6.4 oz (96.3 kg)    SpO2 98%    BMI 35.35 kg/m  Wt Readings from Last 3 Encounters:  05/10/21 212 lb 6.4 oz (96.3 kg)  11/02/20 218 lb 3.2 oz (99 kg)  10/31/20 221 lb (100.2 kg)    Diabetic Foot Exam - Simple   No data filed    Lab Results  Component Value Date   WBC 3.8 12/27/2019   HGB 12.7 12/27/2019   HCT 39.3 12/27/2019   PLT 258 12/27/2019   GLUCOSE 77 11/02/2020   CHOL 254 (H) 11/02/2020   TRIG 105.0 11/02/2020   HDL 77.80 11/02/2020   LDLDIRECT 116.6 03/14/2011   LDLCALC 155 (H) 11/02/2020   ALT 9 11/02/2020   AST 14 11/02/2020   NA 141 11/02/2020   K 3.3 (L) 11/02/2020   CL 102 11/02/2020   CREATININE 0.78 11/02/2020   BUN 14 11/02/2020   CO2 31 11/02/2020   TSH 3.38 10/31/2020   HGBA1C 5.3 10/31/2020    Lab Results  Component Value Date   TSH 3.38 10/31/2020   Lab Results  Component Value Date   WBC 3.8 12/27/2019   HGB 12.7 12/27/2019   HCT 39.3 12/27/2019   MCV 86.4 12/27/2019   PLT 258 12/27/2019   Lab Results  Component Value Date   NA 141 11/02/2020   K 3.3 (L) 11/02/2020   CO2 31 11/02/2020   GLUCOSE 77 11/02/2020   BUN 14 11/02/2020   CREATININE 0.78 11/02/2020   BILITOT 0.5 11/02/2020   ALKPHOS 91 11/02/2020   AST 14 11/02/2020   ALT 9 11/02/2020   PROT 7.2 11/02/2020   ALBUMIN 4.2 11/02/2020   CALCIUM 10.0 11/02/2020   GFR 89.45 11/02/2020   Lab Results  Component Value Date   CHOL 254 (H) 11/02/2020    Lab Results  Component Value Date   HDL 77.80 11/02/2020   Lab Results  Component Value Date   LDLCALC 155 (H) 11/02/2020   Lab Results  Component Value Date   TRIG 105.0 11/02/2020   Lab Results  Component Value Date   CHOLHDL 3 11/02/2020   Lab Results  Component Value Date   HGBA1C 5.3 10/31/2020       Assessment & Plan:   Problem List Items Addressed This Visit       Unprioritized   Essential hypertension    Well controlled, no changes to meds. Encouraged heart healthy diet such as the DASH diet and exercise as tolerated.       Relevant Medications   amLODipine (NORVASC) 10 MG tablet   Other Relevant Orders   Comprehensive metabolic panel   CBC with Differential/Platelet   Lipid panel   TSH   Hyperlipidemia    Encourage heart healthy diet such as MIND or DASH diet, increase exercise, avoid trans fats, simple carbohydrates and processed foods, consider a krill or fish or flaxseed oil cap daily.       Relevant Medications   amLODipine (NORVASC) 10 MG tablet   Hypothyroidism    Check labs       Relevant Orders   Comprehensive metabolic panel   CBC with Differential/Platelet   Lipid panel   TSH   Other Visit Diagnoses     Primary hypertension    -  Primary  Relevant Medications   amLODipine (NORVASC) 10 MG tablet   Gastroesophageal reflux disease       Relevant Medications   omeprazole (PRILOSEC) 20 MG capsule   Acute pain of right knee       Relevant Medications   traMADol (ULTRAM) 50 MG tablet        Meds ordered this encounter  Medications   amLODipine (NORVASC) 10 MG tablet    Sig: Take 1 tablet (10 mg total) by mouth daily. NEEDS OV/FOLLOW UP    Dispense:  90 tablet    Refill:  1   DISCONTD: hydrochlorothiazide (HYDRODIURIL) 25 MG tablet    Sig: Take 1 tablet (25 mg total) by mouth daily.    Dispense:  90 tablet    Refill:  1   omeprazole (PRILOSEC) 20 MG capsule    Sig: TAKE 1 CAPSULE BY MOUTH EVERY DAY    Dispense:  90  capsule    Refill:  3   traMADol (ULTRAM) 50 MG tablet    Sig: Take 1 tablet (50 mg total) by mouth every 8 (eight) hours as needed.    Dispense:  28 tablet    Refill:  2    Not to exceed 4 additional fills before 05/01/2021 DX Code Needed  .    IAnn Held, DO, personally preformed the services described in this documentation.  All medical record entries made by the scribe were at my direction and in my presence.  I have reviewed the chart and discharge instructions (if applicable) and agree that the record reflects my personal performance and is accurate and complete. 05/10/2021   I,Shehryar Baig,acting as a scribe for Ann Held, DO.,have documented all relevant documentation on the behalf of Ann Held, DO,as directed by  Ann Held, DO while in the presence of Ann Held, DO.   Ann Held, DO

## 2021-05-10 NOTE — Assessment & Plan Note (Signed)
Check labs 

## 2021-05-10 NOTE — Assessment & Plan Note (Signed)
Encourage heart healthy diet such as MIND or DASH diet, increase exercise, avoid trans fats, simple carbohydrates and processed foods, consider a krill or fish or flaxseed oil cap daily.  °

## 2021-06-04 ENCOUNTER — Encounter: Payer: Self-pay | Admitting: Internal Medicine

## 2021-06-04 ENCOUNTER — Ambulatory Visit (INDEPENDENT_AMBULATORY_CARE_PROVIDER_SITE_OTHER): Payer: No Typology Code available for payment source | Admitting: Internal Medicine

## 2021-06-04 VITALS — BP 130/88 | HR 82 | Ht 65.0 in | Wt 223.2 lb

## 2021-06-04 DIAGNOSIS — R7309 Other abnormal glucose: Secondary | ICD-10-CM | POA: Diagnosis not present

## 2021-06-04 DIAGNOSIS — E039 Hypothyroidism, unspecified: Secondary | ICD-10-CM | POA: Diagnosis not present

## 2021-06-04 DIAGNOSIS — E669 Obesity, unspecified: Secondary | ICD-10-CM | POA: Diagnosis not present

## 2021-06-04 LAB — POCT GLYCOSYLATED HEMOGLOBIN (HGB A1C): Hemoglobin A1C: 5.6 % (ref 4.0–5.6)

## 2021-06-04 NOTE — Progress Notes (Signed)
Patient ID: [name], female   DOB: 27-May-1970, 51 y.o.   MRN: [id number] ? ?This visit occurred during the SARS-CoV-2 public health emergency.  Safety protocols were in place, including screening questions prior to the visit, additional usage of staff PPE, and extensive cleaning of exam room while observing appropriate contact time as indicated for disinfecting solutions.  ? ?HPI  ?[name] is a 51 y.o.-year-old female, returning for f/u for hypothyroidism and history of elevated HbA1c. Last visit 7 months ago. ? ?Interim hx: ?No increased urination, blurry vision, nausea, chest pain. ?She lost weight since last visit: 9 pounds, but then gained 11 lbs back. She reintroduced beef in her diet since last OV. ? ?Reviewed and addended history: ?Pt. has been dx with hypothyroidism in ~2005 >> started on Levothyroxine 112 mcg for "years", then increased up to 200 mcg.  ?She has a history of noncompliance with her levothyroxine, now taking it every day. ? ?She was then on 150 mcg daily of LT4 but the TSH was very high when checked by PCP last summer and the LT4 dose was increased to 200 mcg daily at that time, however, patient recalls that at that time she had day vomiting 2/2 lap band too tight >> had it stretched since.  A TSH returned suppressed in 01/2019, after which the dose of levothyroxine was decreased.  However, we then need to increase the dose again as TSH increased. ? ?Pt is on levothyroxine 175 mcg daily, taken: ?- first thing in am ?- fasting ?- at least 30 min from b'fast or has a brunch ?- no Ca, Fe, MVI, + PPIs more than 4 hours after levothyroxine ?- not on Biotin now ? ?Reviewed her TFTs: ?Lab Results  ?Component Value Date  ? TSH 0.79 05/10/2021  ? TSH 3.38 10/31/2020  ? TSH 4.90 (H) 08/14/2020  ? TSH 11.46 (H) 06/22/2020  ? TSH 26.17 (H) 05/31/2020  ? TSH 20.09 (H) 04/04/2020  ? TSH 0.64 07/02/2019  ? TSH 0.20 (L) 05/13/2019  ? TSH 0.48 03/11/2019  ? TSH 0.25 (L) 01/25/2019  ? FREET4  0.83 10/31/2020  ? FREET4 1.04 08/14/2020  ? FREET4 0.91 06/22/2020  ? FREET4 0.77 05/31/2020  ? FREET4 0.80 04/04/2020  ? FREET4 1.09 07/02/2019  ? FREET4 1.33 05/13/2019  ? FREET4 1.40 03/11/2019  ? FREET4 1.28 04/06/2018  ? FREET4 1.29 03/14/2017  ? ?Reviewed the report of her thyroid ultrasound: ?Thyroid U/S (02/20/2006): Heterogeneous thyroid, no nodules ? ?Pt denies: ?- feeling nodules in neck ?- hoarseness ?- dysphagia ?- choking ?- SOB with lying down ? ?She has + FH of thyroid disorders in: mother (nodule). No FH of thyroid cancer. No h/o radiation tx to head or neck. ?No herbal supplements. No Biotin use. No recent steroids use.  ? ?She has a history of lap band in 2009.  She lost approximately 100 pounds (from 300 pounds), then plateaued.  She has had more fluctuating weights recently. ? ?Reviewed HbA1c levels: ?Lab Results  ?Component Value Date  ? HGBA1C 5.3 10/31/2020  ? HGBA1C 5.4 10/29/2019  ? HGBA1C 5.7 03/11/2019  ? HGBA1C 5.7 03/10/2018  ? ?Lab Results  ?Component Value Date  ? BUN 14 05/10/2021  ? ?Lab Results  ?Component Value Date  ? CREATININE 0.72 05/10/2021  ? ?+ HL: ?Lab Results  ?Component Value Date  ? CHOL 222 (H) 05/10/2021  ? HDL 73.50 05/10/2021  ? LDLCALC 134 (H) 05/10/2021  ? LDLDIRECT 116.6 03/14/2011  ? TRIG 76.0  05/10/2021  ? CHOLHDL 3 05/10/2021  ? ?Last eye exam: Spring 2022: No DR reportedly. ? ?She stopped beef and pork in 07/2019 - restarted eating beef since. ? ?ROS: ? + see HPI ? ?I reviewed pt's medications, allergies, PMH, social hx, family hx, and changes were documented in the history of present illness. Otherwise, unchanged from my initial visit note. ? ? ?Past Medical History:  ?Diagnosis Date  ? Contact lens/glasses fitting   ? Hypertension   ? Thyroid disease   ? ?Past Surgical History:  ?Procedure Laterality Date  ? ABDOMINAL HYSTERECTOMY  01/2010  ? [place]  ? BREAST BIOPSY Left 12/22/220  ? FIBROCYSTIC CHANGES INCLUDING APOCRINE METAPLASIA  ? CESAREAN SECTION   12/2003  ? LAPAROSCOPIC GASTRIC BANDING  10/12/2007  ? myomectomy    ? 2x's. Patient unsure of date of procedure based on history form dated 11/10/2009.  ? ?Social History  ? ?Social History  ? Marital Status: single  ?  Spouse Name: N/A  ? Number of Children: 1  ? 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 ?Social History Main Topics  ? Smoking status: Never Smoker   ? Smokeless tobacco: Never Used  ? Alcohol Use: No  ? Drug Use: No  ? Sexual Activity:  ?  Partners: Male  ? ?Social History Narrative  ? Exercising---treadmill 4x a week  ? ?Current Outpatient Medications on File Prior to Visit  ?Medication Sig Dispense Refill  ? albuterol (VENTOLIN HFA) 108 (90 Base) MCG/ACT inhaler Inhale 2 puffs into the lungs every 6 (six) hours as needed for wheezing or shortness of breath. 8 g 5  ? amLODipine (NORVASC) 10 MG tablet Take 1 tablet (10 mg total) by mouth daily. NEEDS OV/FOLLOW UP 90 tablet 1  ? levothyroxine (SYNTHROID) 175 MCG tablet Take 1 tablet (175 mcg total) by mouth daily. 90 tablet 3  ? omeprazole (PRILOSEC) 20 MG capsule TAKE 1 CAPSULE BY MOUTH EVERY DAY 90 capsule 3  ? promethazine (PHENERGAN) 25 MG tablet TAKE 1 TABLET BY MOUTH EVERY 6 HOURS AS NEEDED FOR NAUSEA 30 tablet 1  ? traMADol (ULTRAM) 50 MG tablet Take 1 tablet (50 mg total) by mouth every 8 (eight) hours as needed. 28 tablet 2  ? ?No current facility-administered medications on file prior to visit.  ? ?Allergies  ?Allergen Reactions  ? Diflucan [Fluconazole] Swelling  ?  Blister and swelling in mouth  ? Penicillins Rash  ? ?Family History  ?Problem Relation Age of Onset  ? Hypertension Mother   ? Thyroid disease Mother   ? Breast cancer Mother 19  ? Hypertension Father   ? Hypertension Sister   ? Cancer Maternal Aunt   ?     colon  ? ?PE:  ?BP 130/88 (BP Location: Left Arm, Patient Position: Sitting, Cuff Size: Normal)   Pulse 82   Ht '5\' 5"'$  (1.651 m)   Wt 223 lb 3.2 oz (101.2 kg)   SpO2 99%   BMI 37.14 kg/m?   ?Wt Readings from Last  3 Encounters:  ?06/04/21 223 lb 3.2 oz (101.2 kg)  ?05/10/21 212 lb 6.4 oz (96.3 kg)  ?11/02/20 218 lb 3.2 oz (99 kg)  ? ?Constitutional: overweight, in NAD ?Eyes: PERRLA, EOMI, no exophthalmos ?ENT: moist mucous membranes, no thyromegaly, no cervical lymphadenopathy ?Cardiovascular: RRR, No MRG ?Respiratory: CTA B ?Musculoskeletal: no deformities, strength intact in all 4 ?Skin: moist, warm, no rashes ?Neurological: no tremor with outstretched hands, DTR normal in all 4 ? ?ASSESSMENT: ?1.  Hypothyroidism ?- poorly controlled ?- h/o noncompliance with LT4 ? ?2.  Elevated HbA1c ? ?3.  Obesity class II ? ?PLAN:  ?1. Patient with longstanding hypothyroidism, on levothyroxine therapy, with previous noncompliance with levothyroxine. ?- A TSH obtained by PCP in 09/2018 was very high, at 52.6.  At that time, she was on 150 mcg of levothyroxine daily.  The dose was increased to 200 mcg daily by PCP but subsequent TSH was suppressed so her dose was decreased to 137 mcg daily in 01/2019 and 125 mcg daily 05/2019.  After this, TSH was still elevated but she was taking the levothyroxine later in the morning and we moved it first thing in the morning.  However, TSH returned higher afterwards so we have been increasing the dose of levothyroxine gradually.  ?- latest thyroid labs reviewed with pt. >> normal: ?Lab Results  ?Component Value Date  ? TSH 0.79 05/10/2021  ?- she continues on LT4 175 mcg daily ?- pt feels good on this dose. ?- we discussed about taking the thyroid hormone every day, with water, >30 minutes before breakfast, separated by >4 hours from acid reflux medications, calcium, iron, multivitamins. Pt. is taking it correctly. ?-She will need a LT4 refill in 10/2021. ? ?2.  Elevated HbA1c ?-Reviewed latest HbA1c which was lower, at last visit, at 5.3%, excellent ?-at today's visit, HbA1c is: 5.6% (higher, but still at goal) ?-She denies increased thirst, urination, blurry vision ?-Discussed about improving diet ?-We  will recheck at next visit, in 6 months ? ?3.  Obesity class II ?-At last visit, she was on a modified plant-based diet, which included seafood and chicken.  She was previously also eating cheese and bread and w

## 2021-06-04 NOTE — Patient Instructions (Signed)
Please continue Levothyroxine 175 mcg daily. ? ?Take the thyroid hormone every day, with water, at least 30 minutes before breakfast, separated by at least 4 hours from: ?- acid reflux medications ?- calcium ?- iron ?- multivitamins ? ?Please come back for a follow-up appointment in 6 months. ?

## 2021-06-11 ENCOUNTER — Telehealth: Payer: Self-pay | Admitting: *Deleted

## 2021-06-11 NOTE — Telephone Encounter (Signed)
We received fax from [facility] ? ?"We spoke with your patient about asthma care and noticed your patient has multiple rescue inhaler fills without filling a controller medication at [facility] in the last 180 days. ? ?We are reaching out on behalf our your patient to determine if it is appropriate to start a daily asthma controller therapy.  Please send in new prescription for controller therapy if appropriate. " ? ?

## 2021-06-12 NOTE — Telephone Encounter (Signed)
Patient states that she does not use her rescue inhaler a lot, she only uses when needed.  She declined appointment for controller medication at this time.   ?

## 2021-06-19 ENCOUNTER — Other Ambulatory Visit: Payer: Self-pay

## 2021-06-19 ENCOUNTER — Ambulatory Visit (AMBULATORY_SURGERY_CENTER): Payer: No Typology Code available for payment source | Admitting: *Deleted

## 2021-06-19 VITALS — Ht 66.0 in | Wt 215.0 lb

## 2021-06-19 DIAGNOSIS — Z1211 Encounter for screening for malignant neoplasm of colon: Secondary | ICD-10-CM

## 2021-06-19 MED ORDER — SUTAB 1479-225-188 MG PO TABS: ORAL_TABLET | ORAL | 0 refills | Status: DC

## 2021-06-19 MED ORDER — NA SULFATE-K SULFATE-MG SULF 17.5-3.13-1.6 GM/177ML PO SOLN: 1.0000 | Freq: Once | ORAL | 0 refills | Status: AC

## 2021-06-19 NOTE — Progress Notes (Signed)
No egg or soy allergy known to patient  ?No issues known to pt with past sedation with any surgeries or procedures ?Patient denies ever being told they had issues or difficulty with intubation  ?No FH of Malignant Hyperthermia ?Pt is not on diet pills ?Pt is not on  home 02  ?Pt is not on blood thinners  ?Pt denies issues with constipation  ?No A fib or A flutter ? ?SUPREP Coupon to pt in PV today , Code to Pharmacy and  NO PA's for preps discussed with pt In PV today  ?Discussed with pt there will be an out-of-pocket cost for prep and that varies from $0 to 70 +  dollars - pt verbalized understanding  ?Pt instructed to use Singlecare.com or GoodRx for a price reduction on prep  ? ?PV completed over the phone. Pt verified name, DOB, address and insurance during PV today.  ?Pt mailed instruction packet with copy of consent form to read and not return, and instructions.  ?Pt encouraged to call with questions or issues.  ?If pt has My chart, procedure instructions sent via My Chart   ? ?Pt.requested tablets because of lap band,was expressed concerns about liquid ,sutabs and suprep sent and pt. Will decide,made pt.aware that there may be a cost for the tabs,verbalized understanding. ?

## 2021-06-22 ENCOUNTER — Encounter: Payer: Self-pay | Admitting: Gastroenterology

## 2021-07-03 ENCOUNTER — Ambulatory Visit (AMBULATORY_SURGERY_CENTER): Payer: No Typology Code available for payment source | Admitting: Gastroenterology

## 2021-07-03 ENCOUNTER — Encounter: Payer: Self-pay | Admitting: Gastroenterology

## 2021-07-03 VITALS — BP 114/77 | HR 85 | Temp 97.1°F | Resp 22 | Ht 66.0 in | Wt 215.0 lb

## 2021-07-03 DIAGNOSIS — D122 Benign neoplasm of ascending colon: Secondary | ICD-10-CM

## 2021-07-03 DIAGNOSIS — K635 Polyp of colon: Secondary | ICD-10-CM

## 2021-07-03 DIAGNOSIS — Z1211 Encounter for screening for malignant neoplasm of colon: Secondary | ICD-10-CM | POA: Diagnosis present

## 2021-07-03 MED ORDER — SODIUM CHLORIDE 0.9 % IV SOLN: 500.0000 mL | INTRAVENOUS | Status: DC

## 2021-07-03 NOTE — Progress Notes (Signed)
Pt's states no medical or surgical changes since previsit or office visit. 

## 2021-07-03 NOTE — Progress Notes (Signed)
[facility] History and Physical ? ? ?Primary Care Physician:  Ann Held, DO ? ? ?Reason for Procedure:  Colorectal cancer screening ? ?Plan:    Screening colonoscopy with possible interventions as needed ? ? ? ? ?HPI: [name] is a very pleasant 51 y.o. female here for screening colonoscopy. ?Denies any nausea, vomiting, abdominal pain, melena or bright red blood per rectum ? ?The risks and benefits as well as alternatives of endoscopic procedure(s) have been discussed and reviewed. All questions answered. The patient agrees to proceed. ? ? ? ?Past Medical History:  ?Diagnosis Date  ? Allergy   ? SEASONAL,POLLEN,GRASS  ? Arthritis   ? KNEES,LEFT  ? Contact lens/glasses fitting   ? GERD (gastroesophageal reflux disease)   ? Hypertension   ? Thyroid disease   ? ? ?Past Surgical History:  ?Procedure Laterality Date  ? ABDOMINAL HYSTERECTOMY  01/2010  ? [place]  ? BREAST BIOPSY Left 12/22/220  ? FIBROCYSTIC CHANGES INCLUDING APOCRINE METAPLASIA  ? CESAREAN SECTION  12/2003  ? LAPAROSCOPIC GASTRIC BANDING  10/12/2007  ? myomectomy    ? 2x's. Patient unsure of date of procedure based on history form dated 11/10/2009.  ? ? ?Prior to Admission medications   ?Medication Sig Start Date End Date Taking? Authorizing Provider  ?amLODipine (NORVASC) 10 MG tablet Take 1 tablet (10 mg total) by mouth daily. NEEDS OV/FOLLOW UP 05/10/21  Yes Ann Held, DO  ?levothyroxine (SYNTHROID) 175 MCG tablet Take 1 tablet (175 mcg total) by mouth daily. 11/01/20  Yes Philemon Kingdom, MD  ?omeprazole (PRILOSEC) 20 MG capsule TAKE 1 CAPSULE BY MOUTH EVERY DAY 05/10/21  Yes Roma Schanz R, DO  ?albuterol (VENTOLIN HFA) 108 (90 Base) MCG/ACT inhaler Inhale 2 puffs into the lungs every 6 (six) hours as needed for wheezing or shortness of breath. ?Patient not taking: Reported on 06/19/2021 12/15/20   Carollee Herter, Kendrick Fries R, DO  ?promethazine (PHENERGAN) 25 MG tablet TAKE 1 TABLET BY MOUTH EVERY 6 HOURS AS  NEEDED FOR NAUSEA 05/31/19   Carollee Herter, Alferd Apa, DO  ?traMADol (ULTRAM) 50 MG tablet Take 1 tablet (50 mg total) by mouth every 8 (eight) hours as needed. 05/10/21   Ann Held, DO  ? ? ?Current Outpatient Medications  ?Medication Sig Dispense Refill  ? amLODipine (NORVASC) 10 MG tablet Take 1 tablet (10 mg total) by mouth daily. NEEDS OV/FOLLOW UP 90 tablet 1  ? levothyroxine (SYNTHROID) 175 MCG tablet Take 1 tablet (175 mcg total) by mouth daily. 90 tablet 3  ? omeprazole (PRILOSEC) 20 MG capsule TAKE 1 CAPSULE BY MOUTH EVERY DAY 90 capsule 3  ? albuterol (VENTOLIN HFA) 108 (90 Base) MCG/ACT inhaler Inhale 2 puffs into the lungs every 6 (six) hours as needed for wheezing or shortness of breath. (Patient not taking: Reported on 06/19/2021) 8 g 5  ? promethazine (PHENERGAN) 25 MG tablet TAKE 1 TABLET BY MOUTH EVERY 6 HOURS AS NEEDED FOR NAUSEA 30 tablet 1  ? traMADol (ULTRAM) 50 MG tablet Take 1 tablet (50 mg total) by mouth every 8 (eight) hours as needed. 28 tablet 2  ? ?Current Facility-Administered Medications  ?Medication Dose Route Frequency Provider Last Rate Last Admin  ? 0.9 %  sodium chloride infusion  500 mL Intravenous Continuous Verlie Liotta, Venia Minks, MD      ? ? ?Allergies as of 07/03/2021 - Review Complete 07/03/2021  ?Allergen Reaction Noted  ? Diflucan [fluconazole] Swelling 10/18/2011  ? Penicillins Rash 04/04/2006  ? ? ?  Family History  ?Problem Relation Age of Onset  ? Hypertension Mother   ? Thyroid disease Mother   ? Breast cancer Mother 57  ? Hypertension Father   ? Hypertension Sister   ? Colon cancer Maternal Aunt   ? Cancer Maternal Aunt   ?     colon  ? Crohn's disease Neg Hx   ? Esophageal cancer Neg Hx   ? Rectal cancer Neg Hx   ? Stomach cancer Neg Hx   ? ? ?Social History  ? ?Socioeconomic History  ? Marital status: Divorced  ?  Spouse name: Not on file  ? Number of children: Not on file  ? Years of education: Not on file  ? Highest education level: Not on file  ?Occupational  History  ? Not on file  ?Tobacco Use  ? Smoking status: Never  ?  Passive exposure: Never  ? Smokeless tobacco: Never  ?Vaping Use  ? Vaping Use: Never used  ?Substance and Sexual Activity  ? Alcohol use: No  ? Drug use: No  ? Sexual activity: Not on file  ?Other Topics Concern  ? Not on file  ?Social History Narrative  ? Exercising---treadmill 2x a week  ? ?Social Determinants of Health  ? ?Financial Resource Strain: Not on file  ?Food Insecurity: Not on file  ?Transportation Needs: Not on file  ?Physical Activity: Not on file  ?Stress: Not on file  ?Social Connections: Not on file  ?Intimate Partner Violence: Not on file  ? ? ?Review of Systems: ? ?All other review of systems negative except as mentioned in the HPI. ? ?Physical Exam: ?Vital signs in last 24 hours: ?BP 119/79   Pulse (!) 102   Temp (!) 97.1 ?F (36.2 ?C) (Temporal)   Ht '5\' 6"'$  (1.676 m)   Wt 215 lb (97.5 kg)   SpO2 100%   BMI 34.70 kg/m?  ?General:   Alert, NAD ?Lungs:  Clear .   ?Heart:  Regular rate and rhythm ?Abdomen:  Soft, nontender and nondistended. ?Neuro/Psych:  Alert and cooperative. Normal mood and affect. A and O x 3 ? ?Reviewed labs, radiology imaging, old records and pertinent past GI work up ? ?Patient is appropriate for planned procedure(s) and anesthesia in an ambulatory setting ? ? ?K. Denzil Magnuson , MD ?[phone]  ? ? ?  ?

## 2021-07-03 NOTE — Progress Notes (Signed)
Called to room to assist during endoscopic procedure.  Patient ID and intended procedure confirmed with present staff. Received instructions for my participation in the procedure from the performing physician.  

## 2021-07-03 NOTE — Op Note (Signed)
[facility] ?Patient Name: [name] ?Procedure Date: 07/03/2021 10:45 AM ?MRN: [id number] ?Endoscopist: Mauri Pole , MD ?Age: 51 ?Referring MD:  ?Date of Birth: 19-Mar-1970 ?Gender: Female ?Account #: [id number] ?Procedure:                Colonoscopy ?Indications:              Screening for colorectal malignant neoplasm ?Medicines:                Monitored Anesthesia Care ?Procedure:                Pre-Anesthesia Assessment: ?                          - Prior to the procedure, a History and Physical  ?                          was performed, and patient medications and  ?                          allergies were reviewed. The patient's tolerance of  ?                          previous anesthesia was also reviewed. The risks  ?                          and benefits of the procedure and the sedation  ?                          options and risks were discussed with the patient.  ?                          All questions were answered, and informed consent  ?                          was obtained. Prior Anticoagulants: The patient has  ?                          taken no previous anticoagulant or antiplatelet  ?                          agents. ASA Grade Assessment: II - A patient with  ?                          mild systemic disease. After reviewing the risks  ?                          and benefits, the patient was deemed in  ?                          satisfactory condition to undergo the procedure. ?                          After obtaining informed consent, the colonoscope  ?  was passed under direct vision. Throughout the  ?                          procedure, the patient's blood pressure, pulse, and  ?                          oxygen saturations were monitored continuously. The  ?                          Olympus [id number]-[id number] (#[id number]) Colonoscope was  ?                          introduced through the anus and advanced to the the  ?                          cecum,  identified by appendiceal orifice and  ?                          ileocecal valve. The colonoscopy was somewhat  ?                          difficult due to the patient's oxygen desaturation.  ?                          Successful completion of the procedure was aided by  ?                          managing the patient's medical instability. The  ?                          patient tolerated the procedure well. ?Scope In: 11:16:22 AM ?Scope Out: 11:31:25 AM ?Scope Withdrawal Time: 0 hours 12 minutes 25 seconds  ?Total Procedure Duration: 0 hours 15 minutes 3 seconds  ?Findings:                 The perianal and digital rectal examinations were  ?                          normal. ?                          A 2 mm polyp was found in the ascending colon. The  ?                          polyp was sessile. The polyp was removed with a  ?                          cold biopsy forceps. Resection and retrieval were  ?                          complete. ?                          A few small-mouthed diverticula were found in the  ?  sigmoid colon. ?                          Non-bleeding external and internal hemorrhoids were  ?                          found during retroflexion. The hemorrhoids were  ?                          medium-sized. ?Complications:            No immediate complications. ?Estimated Blood Loss:     Estimated blood loss was minimal. ?Impression:               - One 2 mm polyp in the ascending colon, removed  ?                          with a cold biopsy forceps. Resected and retrieved. ?                          - Diverticulosis in the sigmoid colon. ?                          - Non-bleeding external and internal hemorrhoids. ?Recommendation:           - Patient has a contact number available for  ?                          emergencies. The signs and symptoms of potential  ?                          delayed complications were discussed with the  ?                          patient.  Return to normal activities tomorrow.  ?                          Written discharge instructions were provided to the  ?                          patient. ?                          - Resume previous diet. ?                          - Continue present medications. ?                          - Await pathology results. ?                          - Repeat colonoscopy in 5-10 years for surveillance  ?                          based on pathology results. ?Mauri Pole, MD ?07/03/2021 11:38:13 AM ?This report has been signed electronically. ?

## 2021-07-03 NOTE — Patient Instructions (Signed)
Handout on polyps given. ° °YOU HAD AN ENDOSCOPIC PROCEDURE TODAY AT THE [facility]:   Refer to the procedure report that was given to you for any specific questions about what was found during the examination.  If the procedure report does not answer your questions, please call your gastroenterologist to clarify.  If you requested that your care partner not be given the details of your procedure findings, then the procedure report has been included in a sealed envelope for you to review at your convenience later. ° °YOU SHOULD EXPECT: Some feelings of bloating in the abdomen. Passage of more gas than usual.  Walking can help get rid of the air that was put into your GI tract during the procedure and reduce the bloating. If you had a lower endoscopy (such as a colonoscopy or flexible sigmoidoscopy) you may notice spotting of blood in your stool or on the toilet paper. If you underwent a bowel prep for your procedure, you may not have a normal bowel movement for a few days. ° °Please Note:  You might notice some irritation and congestion in your nose or some drainage.  This is from the oxygen used during your procedure.  There is no need for concern and it should clear up in a day or so. ° °SYMPTOMS TO REPORT IMMEDIATELY: ° °Following lower endoscopy (colonoscopy or flexible sigmoidoscopy): ° Excessive amounts of blood in the stool ° Significant tenderness or worsening of abdominal pains ° Swelling of the abdomen that is new, acute ° Fever of 100°F or higher ° °For urgent or emergent issues, a gastroenterologist can be reached at any hour by calling [phone]. °Do not use MyChart messaging for urgent concerns.  ° ° °DIET:  We do recommend a small meal at first, but then you may proceed to your regular diet.  Drink plenty of fluids but you should avoid alcoholic beverages for 24 hours. ° °ACTIVITY:  You should plan to take it easy for the rest of today and you should NOT DRIVE or use heavy machinery  until tomorrow (because of the sedation medicines used during the test).   ° °FOLLOW UP: °Our staff will call the number listed on your records 48-72 hours following your procedure to check on you and address any questions or concerns that you may have regarding the information given to you following your procedure. If we do not reach you, we will leave a message.  We will attempt to reach you two times.  During this call, we will ask if you have developed any symptoms of COVID 19. If you develop any symptoms (ie: fever, flu-like symptoms, shortness of breath, cough etc.) before then, please call [phone].  If you test positive for Covid 19 in the 2 weeks post procedure, please call and report this information to us.   ° °If any biopsies were taken you will be contacted by phone or by letter within the next 1-3 weeks.  Please call us at [phone] if you have not heard about the biopsies in 3 weeks.  ° ° °SIGNATURES/CONFIDENTIALITY: °You and/or your care partner have signed paperwork which will be entered into your electronic medical record.  These signatures attest to the fact that that the information above on your After Visit Summary has been reviewed and is understood.  Full responsibility of the confidentiality of this discharge information lies with you and/or your care-partner.  °

## 2021-07-03 NOTE — Progress Notes (Signed)
Report to pacu rn; vss. ?

## 2021-07-05 ENCOUNTER — Telehealth: Payer: Self-pay

## 2021-07-05 NOTE — Telephone Encounter (Signed)
?  Follow up Call- ? ? ?  07/03/2021  ? 10:44 AM  ?Call back number  ?Post procedure Call Back phone  # [phone]  ?Permission to leave phone message Yes  ?  ? ?Patient questions: ? ?Do you have a fever, pain , or abdominal swelling? No. ?Pain Score  0 * ? ?Have you tolerated food without any problems? Yes.   ? ?Have you been able to return to your normal activities? Yes.   ? ?Do you have any questions about your discharge instructions: ?Diet   No. ?Medications  No. ?Follow up visit  No. ? ?Do you have questions or concerns about your Care? No. ? ?Actions: ?* If pain score is 4 or above: ?No action needed, pain <4. ? ? ?

## 2021-07-10 ENCOUNTER — Other Ambulatory Visit: Payer: Self-pay | Admitting: Obstetrics and Gynecology

## 2021-07-10 DIAGNOSIS — Z1231 Encounter for screening mammogram for malignant neoplasm of breast: Secondary | ICD-10-CM

## 2021-07-13 ENCOUNTER — Ambulatory Visit
Admission: RE | Admit: 2021-07-13 | Discharge: 2021-07-13 | Disposition: A | Discharge status: Home / Self Care | Payer: No Typology Code available for payment source | Source: Ambulatory Visit | Attending: Obstetrics and Gynecology | Admitting: Obstetrics and Gynecology

## 2021-07-13 ENCOUNTER — Encounter: Payer: Self-pay | Admitting: Gastroenterology

## 2021-07-13 DIAGNOSIS — Z1231 Encounter for screening mammogram for malignant neoplasm of breast: Secondary | ICD-10-CM

## 2021-08-11 ENCOUNTER — Encounter: Payer: Self-pay | Admitting: Family Medicine

## 2021-08-13 ENCOUNTER — Other Ambulatory Visit: Payer: Self-pay | Admitting: Family Medicine

## 2021-08-13 DIAGNOSIS — T753XXA Motion sickness, initial encounter: Secondary | ICD-10-CM

## 2021-08-13 MED ORDER — SCOPOLAMINE 1 MG/3DAYS TD PT72: 1.0000 | MEDICATED_PATCH | TRANSDERMAL | 12 refills | Status: DC

## 2021-08-16 ENCOUNTER — Other Ambulatory Visit: Payer: Self-pay | Admitting: Family Medicine

## 2021-08-16 ENCOUNTER — Encounter: Payer: Self-pay | Admitting: Family Medicine

## 2021-08-16 DIAGNOSIS — I10 Essential (primary) hypertension: Secondary | ICD-10-CM

## 2021-08-16 DIAGNOSIS — E876 Hypokalemia: Secondary | ICD-10-CM

## 2021-08-16 MED ORDER — HYDROCHLOROTHIAZIDE 25 MG PO TABS: 25.0000 mg | ORAL_TABLET | Freq: Every day | ORAL | 3 refills | Status: DC

## 2021-08-16 MED ORDER — POTASSIUM CHLORIDE CRYS ER 20 MEQ PO TBCR: 20.0000 meq | EXTENDED_RELEASE_TABLET | Freq: Every day | ORAL | 3 refills | Status: DC

## 2021-11-05 ENCOUNTER — Other Ambulatory Visit: Payer: Self-pay | Admitting: Internal Medicine

## 2021-11-05 DIAGNOSIS — E039 Hypothyroidism, unspecified: Secondary | ICD-10-CM

## 2021-11-22 ENCOUNTER — Encounter: Payer: No Typology Code available for payment source | Admitting: Family Medicine

## 2021-12-04 ENCOUNTER — Ambulatory Visit: Payer: No Typology Code available for payment source | Admitting: Internal Medicine

## 2021-12-19 ENCOUNTER — Ambulatory Visit (INDEPENDENT_AMBULATORY_CARE_PROVIDER_SITE_OTHER): Payer: No Typology Code available for payment source | Admitting: Internal Medicine

## 2021-12-19 ENCOUNTER — Encounter: Payer: Self-pay | Admitting: Internal Medicine

## 2021-12-19 VITALS — BP 120/80 | HR 75 | Ht 66.0 in | Wt 217.0 lb

## 2021-12-19 DIAGNOSIS — E039 Hypothyroidism, unspecified: Secondary | ICD-10-CM

## 2021-12-19 DIAGNOSIS — R7309 Other abnormal glucose: Secondary | ICD-10-CM | POA: Diagnosis not present

## 2021-12-19 DIAGNOSIS — E66812 Obesity, class 2: Secondary | ICD-10-CM

## 2021-12-19 DIAGNOSIS — E669 Obesity, unspecified: Secondary | ICD-10-CM | POA: Diagnosis not present

## 2021-12-19 LAB — T4, FREE: Free T4: 0.69 ng/dL (ref 0.60–1.60)

## 2021-12-19 LAB — TSH: TSH: 17.09 u[IU]/mL — ABNORMAL HIGH (ref 0.35–5.50)

## 2021-12-19 LAB — HEMOGLOBIN A1C: Hgb A1c MFr Bld: 5.9 % (ref 4.6–6.5)

## 2021-12-19 NOTE — Patient Instructions (Signed)
Please continue Levothyroxine 175 mcg daily.  Take the thyroid hormone every day, with water, at least 30 minutes before breakfast, separated by at least 4 hours from: - acid reflux medications - calcium - iron - multivitamins  Please stop at the lab.  Please come back for a follow-up appointment in 6 months.

## 2021-12-19 NOTE — Progress Notes (Signed)
Patient ID: [name], female   DOB: 09-03-1970, 51 y.o.   MRN: [id number]  HPI  [name] is a 51 y.o.-year-old female, returning for f/u for hypothyroidism and history of elevated HbA1c. Last visit 6 months ago.  Interim hx: No increased urination, blurry vision, nausea, chest pain.  Reviewed history: Pt. has been dx with hypothyroidism in ~2005 >> started on Levothyroxine 112 mcg for "years", then increased up to 200 mcg.  She has a history of noncompliance with her levothyroxine, now taking it every day.  She was then on 150 mcg daily of LT4 but the TSH was very high when checked by PCP last summer and the LT4 dose was increased to 200 mcg daily at that time, however, patient recalls that at that time she had day vomiting 2/2 lap band too tight >> had it stretched since.  A TSH returned suppressed in 01/2019, after which the dose of levothyroxine was decreased.  However, we then need to increase the dose again as TSH increased.  Pt is on levothyroxine 175 mcg daily, taken: - first thing in am - fasting - at least 30 min from b'fast or has a brunch - no Ca, Fe, MVI, + PPIs more than 4 hours after levothyroxine - not on Biotin now  Reviewed her TFTs: Lab Results  Component Value Date   TSH 0.79 05/10/2021   TSH 3.38 10/31/2020   TSH 4.90 (H) 08/14/2020   TSH 11.46 (H) 06/22/2020   TSH 26.17 (H) 05/31/2020   TSH 20.09 (H) 04/04/2020   TSH 0.64 07/02/2019   TSH 0.20 (L) 05/13/2019   TSH 0.48 03/11/2019   TSH 0.25 (L) 01/25/2019   FREET4 0.83 10/31/2020   FREET4 1.04 08/14/2020   FREET4 0.91 06/22/2020   FREET4 0.77 05/31/2020   FREET4 0.80 04/04/2020   FREET4 1.09 07/02/2019   FREET4 1.33 05/13/2019   FREET4 1.40 03/11/2019   FREET4 1.28 04/06/2018   FREET4 1.29 03/14/2017   Reviewed the report of her thyroid ultrasound: Thyroid U/S (02/20/2006): Heterogeneous thyroid, no nodules  Pt denies: - feeling nodules in neck - hoarseness - dysphagia -  choking  She has + FH of thyroid disorders in: mother (nodule). No FH of thyroid cancer. No h/o radiation tx to head or neck. No herbal supplements. No Biotin use. No recent steroids use.   She has a history of lap band in 2009.  She lost approximately 100 pounds (from 300 pounds), then plateaued.  She has had more fluctuating weights recently.  Reviewed HbA1c levels: Lab Results  Component Value Date   HGBA1C 5.6 06/04/2021   HGBA1C 5.3 10/31/2020   HGBA1C 5.4 10/29/2019   HGBA1C 5.7 03/11/2019   HGBA1C 5.7 03/10/2018   Lab Results  Component Value Date   BUN 14 05/10/2021   Lab Results  Component Value Date   CREATININE 0.72 05/10/2021   + HL: Lab Results  Component Value Date   CHOL 222 (H) 05/10/2021   HDL 73.50 05/10/2021   LDLCALC 134 (H) 05/10/2021   LDLDIRECT 116.6 03/14/2011   TRIG 76.0 05/10/2021   CHOLHDL 3 05/10/2021   Last eye exam: 12/2021: No DR reportedly.  She stopped beef and pork in 07/2019 - restarted eating beef since.  ROS:  + see HPI  I reviewed pt's medications, allergies, PMH, social hx, family hx, and changes were documented in the history of present illness. Otherwise, unchanged from my initial visit note.   Past Medical History:  Diagnosis Date  Allergy    SEASONAL,POLLEN,GRASS   Arthritis    KNEES,LEFT   Contact lens/glasses fitting    GERD (gastroesophageal reflux disease)    Hypertension    Thyroid disease    Past Surgical History:  Procedure Laterality Date   ABDOMINAL HYSTERECTOMY  01/2010   [facility]ah   BREAST BIOPSY Left 12/22/220   FIBROCYSTIC CHANGES INCLUDING APOCRINE METAPLASIA   CESAREAN SECTION  12/2003   LAPAROSCOPIC GASTRIC BANDING  10/12/2007   myomectomy     2x's. Patient unsure of date of procedure based on history form dated 11/10/2009.   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 (Patient not taking: Reported on 06/19/2021) 8 g 5   amLODipine (NORVASC) 10 MG tablet Take 1 tablet (10 mg total) by mouth daily. NEEDS OV/FOLLOW UP 90 tablet 1   hydrochlorothiazide (HYDRODIURIL) 25 MG tablet Take 1 tablet (25 mg total) by mouth daily. 90 tablet 3   levothyroxine (SYNTHROID) 175 MCG tablet TAKE 1 TABLET BY MOUTH EVERY DAY 90 tablet 3   omeprazole (PRILOSEC) 20 MG capsule TAKE 1 CAPSULE BY MOUTH EVERY DAY 90 capsule 3   potassium chloride SA (KLOR-CON M20) 20 MEQ tablet Take 1 tablet (20 mEq total) by mouth daily. 90 tablet 3   promethazine (PHENERGAN) 25 MG tablet TAKE 1 TABLET BY MOUTH EVERY 6 HOURS AS NEEDED FOR NAUSEA 30 tablet 1   scopolamine (TRANSDERM-SCOP) 1 MG/3DAYS Place 1 patch (1.5 mg total) onto the skin every 3 (three) days. 10 patch 12   traMADol (ULTRAM) 50 MG tablet Take 1 tablet (50 mg total) by mouth every 8 (eight) hours as needed. 28 tablet 2   No current facility-administered medications on file prior to visit.   Allergies  Allergen Reactions   Diflucan [Fluconazole] Swelling    Blister and swelling in mouth   Penicillins Rash   Family History  Problem Relation Age of Onset   Hypertension Mother    Thyroid disease Mother    Breast cancer Mother 32   Hypertension Father    Hypertension Sister    Colon cancer Maternal Aunt    Cancer Maternal Aunt        colon   Crohn's disease Neg Hx    Esophageal cancer Neg Hx    Rectal cancer Neg Hx    Stomach cancer Neg Hx    PE:  BP 120/80 (BP Location:  Right Arm, Patient Position: Sitting, Cuff Size: Normal)   Pulse 75   Ht '5\' 6"'$  (1.676 m)   Wt 217 lb (98.4 kg)   SpO2 98%   BMI 35.02 kg/m   Wt Readings from Last 3 Encounters:  12/19/21 217 lb (98.4 kg)  07/03/21 215 lb (97.5 kg)  06/19/21 215 lb (97.5 kg)   Constitutional: overweight, in NAD Eyes:  EOMI, no exophthalmos ENT:  no neck masses, no cervical lymphadenopathy Cardiovascular: RRR, No MRG Respiratory: CTA B Musculoskeletal: no deformities Skin:no rashes Neurological: no tremor with outstretched hands  ASSESSMENT: 1. Hypothyroidism - poorly controlled - h/o noncompliance with LT4  2.  Elevated HbA1c  3.  Obesity class II  PLAN:  1. Patient with longstanding hypothyroidism, on levothyroxine therapy, with previous noncompliance with levothyroxine - A TSH obtained by PCP in 09/2018 was very high, at 52.6.  At that time, she was on 150 mcg of levothyroxine daily.  The dose was increased to 200 mcg daily by PCP but subsequent TSH was suppressed so her dose was decreased to 137 mcg daily in 01/2019 and 125 mcg daily 05/2019.  After this, TSH was still elevated but she was taking the levothyroxine later in the morning and we moved it first thing in the morning.  However, TSH returned higher afterwards so we have been increasing the dose of levothyroxine gradually.  - latest thyroid labs reviewed with pt. >> normal: Lab Results  Component Value Date   TSH 0.79 05/10/2021  - she continues on LT4 175 mcg daily - pt feels good on this dose. - we discussed about taking the thyroid hormone every day, with water, >30 minutes before breakfast, separated by >4 hours from acid reflux medications, calcium, iron, multivitamins. Pt. is taking it correctly. - will check thyroid tests today: TSH and fT4 - If labs are abnormal, she will need to return for repeat TFTs in 1.5 months  2.  Elevated HbA1c -Reviewed latest HbA1c which was higher, at 5.6%, but still at goal - will rechek this  today -No increased thirst, urination, blurry vision -At last visit we discussed about improving diet -We will recheck this at next visit, in 6 months  3.  Obesity class II -She was previously on a modified plant-based diet, which included seafood and chicken.  She later reintroduced meat. -At last visit, she gained 11 pounds back. -at this visit, weight is approx. Stable: only gained 2 lbs   Component     Latest Ref Rng 12/19/2021  TSH     0.35 - 5.50 uIU/mL 17.09 (H)   T4,Free(Direct)     0.60 - 1.60 ng/dL 0.69   Hemoglobin A1C     4.6 - 6.5 % 5.9    TSH is quite high.  This is very unusual especially since on the same dose, at last visit, her TSH was normal.  I would suggest to continue the same dose of levothyroxine, make sure she is not skipping doses, and repeat the tests in 1.5 months. HbA1c is higher, in the prediabetic range. We will recommend regular exercise and working on her diet.  Philemon Kingdom, MD PhD [facility] Endocrinology

## 2022-02-14 ENCOUNTER — Other Ambulatory Visit: Payer: Self-pay | Admitting: Family Medicine

## 2022-02-14 ENCOUNTER — Encounter: Payer: Self-pay | Admitting: Family Medicine

## 2022-02-14 DIAGNOSIS — I10 Essential (primary) hypertension: Secondary | ICD-10-CM

## 2022-02-14 MED ORDER — AMLODIPINE BESYLATE 10 MG PO TABS: 10.0000 mg | ORAL_TABLET | Freq: Every day | ORAL | 0 refills | Status: DC

## 2022-03-22 ENCOUNTER — Encounter: Payer: No Typology Code available for payment source | Admitting: Family Medicine

## 2022-04-26 ENCOUNTER — Other Ambulatory Visit (INDEPENDENT_AMBULATORY_CARE_PROVIDER_SITE_OTHER): Payer: No Typology Code available for payment source

## 2022-04-26 DIAGNOSIS — E039 Hypothyroidism, unspecified: Secondary | ICD-10-CM

## 2022-04-26 LAB — TSH: TSH: 3.39 u[IU]/mL (ref 0.35–5.50)

## 2022-04-26 LAB — T4, FREE: Free T4: 0.86 ng/dL (ref 0.60–1.60)

## 2022-05-13 ENCOUNTER — Other Ambulatory Visit: Payer: Self-pay | Admitting: Family Medicine

## 2022-05-13 DIAGNOSIS — I10 Essential (primary) hypertension: Secondary | ICD-10-CM

## 2022-06-04 ENCOUNTER — Ambulatory Visit (INDEPENDENT_AMBULATORY_CARE_PROVIDER_SITE_OTHER): Payer: No Typology Code available for payment source | Admitting: Family Medicine

## 2022-06-04 ENCOUNTER — Encounter: Payer: Self-pay | Admitting: Family Medicine

## 2022-06-04 VITALS — BP 108/80 | HR 80 | Temp 98.6°F | Resp 18 | Ht 66.0 in | Wt 202.4 lb

## 2022-06-04 DIAGNOSIS — Z23 Encounter for immunization: Secondary | ICD-10-CM

## 2022-06-04 DIAGNOSIS — Z0000 Encounter for general adult medical examination without abnormal findings: Secondary | ICD-10-CM | POA: Insufficient documentation

## 2022-06-04 DIAGNOSIS — K219 Gastro-esophageal reflux disease without esophagitis: Secondary | ICD-10-CM | POA: Diagnosis not present

## 2022-06-04 DIAGNOSIS — I10 Essential (primary) hypertension: Secondary | ICD-10-CM | POA: Diagnosis not present

## 2022-06-04 DIAGNOSIS — E039 Hypothyroidism, unspecified: Secondary | ICD-10-CM

## 2022-06-04 DIAGNOSIS — E785 Hyperlipidemia, unspecified: Secondary | ICD-10-CM

## 2022-06-04 DIAGNOSIS — M25561 Pain in right knee: Secondary | ICD-10-CM

## 2022-06-04 DIAGNOSIS — E876 Hypokalemia: Secondary | ICD-10-CM | POA: Diagnosis not present

## 2022-06-04 LAB — COMPREHENSIVE METABOLIC PANEL
ALT: 10 U/L (ref 0–35)
AST: 13 U/L (ref 0–37)
Albumin: 4.3 g/dL (ref 3.5–5.2)
Alkaline Phosphatase: 108 U/L (ref 39–117)
BUN: 14 mg/dL (ref 6–23)
CO2: 30 mEq/L (ref 19–32)
Calcium: 10 mg/dL (ref 8.4–10.5)
Chloride: 103 mEq/L (ref 96–112)
Creatinine, Ser: 0.79 mg/dL (ref 0.40–1.20)
GFR: 87.12 mL/min (ref >60.00)
Glucose, Bld: 87 mg/dL (ref 70–99)
Potassium: 3.8 mEq/L (ref 3.5–5.1)
Sodium: 140 mEq/L (ref 135–145)
Total Bilirubin: 0.5 mg/dL (ref 0.2–1.2)
Total Protein: 7.2 g/dL (ref 6.0–8.3)

## 2022-06-04 LAB — CBC WITH DIFFERENTIAL/PLATELET
Basophils Absolute: 0 10*3/uL (ref 0.0–0.1)
Basophils Relative: 0.3 % (ref 0.0–3.0)
Eosinophils Absolute: 0.1 10*3/uL (ref 0.0–0.7)
Eosinophils Relative: 2 % (ref 0.0–5.0)
HCT: 37.5 % (ref 36.0–46.0)
Hemoglobin: 12 g/dL (ref 12.0–15.0)
Lymphocytes Relative: 41.3 % (ref 12.0–46.0)
Lymphs Abs: 2 10*3/uL (ref 0.7–4.0)
MCHC: 32 g/dL (ref 30.0–36.0)
MCV: 86 fl (ref 78.0–100.0)
Monocytes Absolute: 0.3 10*3/uL (ref 0.1–1.0)
Monocytes Relative: 7.1 % (ref 3.0–12.0)
Neutro Abs: 2.4 10*3/uL (ref 1.4–7.7)
Neutrophils Relative %: 49.3 % (ref 43.0–77.0)
Platelets: 290 10*3/uL (ref 150.0–400.0)
RBC: 4.36 Mil/uL (ref 3.87–5.11)
RDW: 15.5 % (ref 11.5–15.5)
WBC: 4.8 10*3/uL (ref 4.0–10.5)

## 2022-06-04 LAB — LIPID PANEL
Cholesterol: 266 mg/dL — ABNORMAL HIGH (ref 0–200)
HDL: 74.5 mg/dL (ref >39.00)
LDL Cholesterol: 170 mg/dL — ABNORMAL HIGH (ref 0–99)
NonHDL: 191.37
Total CHOL/HDL Ratio: 4
Triglycerides: 108 mg/dL (ref 0.0–149.0)
VLDL: 21.6 mg/dL (ref 0.0–40.0)

## 2022-06-04 LAB — TSH: TSH: 5.93 u[IU]/mL — ABNORMAL HIGH (ref 0.35–5.50)

## 2022-06-04 MED ORDER — POTASSIUM CHLORIDE CRYS ER 20 MEQ PO TBCR: 20.0000 meq | EXTENDED_RELEASE_TABLET | Freq: Every day | ORAL | 3 refills | Status: DC

## 2022-06-04 MED ORDER — HYDROCHLOROTHIAZIDE 25 MG PO TABS: 25.0000 mg | ORAL_TABLET | Freq: Every day | ORAL | 3 refills | Status: DC

## 2022-06-04 MED ORDER — AMLODIPINE BESYLATE 10 MG PO TABS: 10.0000 mg | ORAL_TABLET | Freq: Every day | ORAL | 1 refills | Status: DC

## 2022-06-04 MED ORDER — TRAMADOL HCL 50 MG PO TABS: 50.0000 mg | ORAL_TABLET | Freq: Three times a day (TID) | ORAL | 2 refills | Status: AC | PRN

## 2022-06-04 MED ORDER — OMEPRAZOLE 20 MG PO CPDR: DELAYED_RELEASE_CAPSULE | ORAL | 3 refills | Status: DC

## 2022-06-04 NOTE — Assessment & Plan Note (Signed)
Ghm utd Check labs  See AVS  Health Maintenance  Topic Date Due   Zoster Vaccines- Shingrix (1 of 2) Never done   COVID-19 Vaccine (4 - 2023-24 season) 11/02/2021   HIV Screening  04/16/2023 (Originally 11/02/1986)   PAP SMEAR-Modifier  03/06/2026 (Originally 04/06/2021)   MAMMOGRAM  07/14/2022   INFLUENZA VACCINE  10/03/2022   COLONOSCOPY (Pts 45-21yrs Insurance coverage will need to be confirmed)  07/04/2031   DTaP/Tdap/Td (3 - Td or Tdap) 06/03/2032   Hepatitis C Screening  Completed   HPV VACCINES  Aged Out

## 2022-06-04 NOTE — Progress Notes (Signed)
Subjective:   By signing my name below, I, Shehryar Baig, attest that this documentation has been prepared under the direction and in the presence of Ann Held, DO. 06/04/2022   Patient ID: [name], female    DOB: 07-18-1970, 52 y.o.   MRN: [id number]  Chief Complaint  Patient presents with   Annual Exam    Pt states fasting     HPI Patient is in today for a comprehensive physical exam.   She denies any recent pain/problems with her knee. She still occasionally take her Tramadol for her knee. Pain is present mainly during exercise. She uses the treadmill 3-4x a week.  Her blood blood pressure is doing well during this visit.  BP Readings from Last 3 Encounters:  06/04/22 108/80  12/19/21 120/80  07/03/21 114/77   Pulse Readings from Last 3 Encounters:  06/04/22 80  12/19/21 75  07/03/21 85   She denies fever, new moles, congestion, sinus pain, sore throat, chest pain, palpitations, cough, shortness of breath, wheezing, nausea, vomiting, abdominal pain, diarrhea, constipation, dysuria, frequency, hematuria, new muscle pain, new joint pain, or headaches at this time.  She denies having any grandchildren. She also denies any change in family history.She has been working for [employer] from home for the past 30 yrs. She is still single. She denies new surgery's recently.  She is eligible for the shingles vaccine but needs to think about receiving it. She will be receiving her tetanus vaccine today.   Mammogram last completed 07/13/2021. Results are normal. Repeat in 1 year.  Pap smear Colonoscopy last completed 07/03/2021. Results showed: - One 2 mm polyp in the ascending colon, removed with a cold biopsy forceps. Resected and retrieved. - Diverticulosis in the sigmoid colon. - Non- bleeding external and internal hemorrhoids. Repeat in 5-10 years.  She continues to see her eye doctor and obgyn regularly.    Past Medical History:  Diagnosis Date   Allergy     SEASONAL,POLLEN,GRASS   Arthritis    KNEES,LEFT   Contact lens/glasses fitting    GERD (gastroesophageal reflux disease)    Hypertension    Thyroid disease     Past Surgical History:  Procedure Laterality Date   ABDOMINAL HYSTERECTOMY  01/2010   Tah   BREAST BIOPSY Left 12/22/220   FIBROCYSTIC CHANGES INCLUDING APOCRINE METAPLASIA   CESAREAN SECTION  12/2003   LAPAROSCOPIC GASTRIC BANDING  10/12/2007   myomectomy     2x's. Patient unsure of date of procedure based on history form dated 11/10/2009.    Family History  Problem Relation Age of Onset   Hypertension Mother    Thyroid disease Mother    Breast cancer Mother 8   Hypertension Father    Hypertension Sister    Colon cancer Maternal Aunt    Cancer Maternal Aunt        colon   Crohn's disease Neg Hx    Esophageal cancer Neg Hx    Rectal cancer Neg Hx    Stomach cancer Neg Hx     Social History   Socioeconomic History   Marital status: Divorced    Spouse name: Not on file   Number of children: Not on file   Years of education: Not on file   Highest education level: Not on file  Occupational History   Occupation: [employer]--[employer]  Tobacco Use   Smoking status: Never    Passive exposure: Never   Smokeless tobacco: Never  Vaping Use   Vaping  Use: Never used  Substance and Sexual Activity   Alcohol use: No   Drug use: No   Sexual activity: Not on file  Other Topics Concern   Not on file  Social History Narrative   Exercising---treadmill 3-4 a week   Social Determinants of Health   Financial Resource Strain: Not on file  Food Insecurity: Not on file  Transportation Needs: Not on file  Physical Activity: Not on file  Stress: Not on file  Social Connections: Not on file  Intimate Partner Violence: Not on file    Outpatient Medications Prior to Visit  Medication Sig Dispense Refill   levothyroxine (SYNTHROID) 175 MCG tablet TAKE 1 TABLET BY MOUTH EVERY DAY 90 tablet 3   promethazine (PHENERGAN) 25 MG  tablet TAKE 1 TABLET BY MOUTH EVERY 6 HOURS AS NEEDED FOR NAUSEA 30 tablet 1   amLODipine (NORVASC) 10 MG tablet TAKE 1 TABLET BY MOUTH EVERY DAY 90 tablet 0   hydrochlorothiazide (HYDRODIURIL) 25 MG tablet Take 1 tablet (25 mg total) by mouth daily. 90 tablet 3   omeprazole (PRILOSEC) 20 MG capsule TAKE 1 CAPSULE BY MOUTH EVERY DAY 90 capsule 3   potassium chloride SA (KLOR-CON M20) 20 MEQ tablet Take 1 tablet (20 mEq total) by mouth daily. 90 tablet 3   scopolamine (TRANSDERM-SCOP) 1 MG/3DAYS Place 1 patch (1.5 mg total) onto the skin every 3 (three) days. 10 patch 12   traMADol (ULTRAM) 50 MG tablet Take 1 tablet (50 mg total) by mouth every 8 (eight) hours as needed. 28 tablet 2   albuterol (VENTOLIN HFA) 108 (90 Base) MCG/ACT inhaler Inhale 2 puffs into the lungs every 6 (six) hours as needed for wheezing or shortness of breath. (Patient not taking: Reported on 06/19/2021) 8 g 5   No facility-administered medications prior to visit.    Allergies  Allergen Reactions   Diflucan [Fluconazole] Swelling    Blister and swelling in mouth   Penicillins Rash    Review of Systems  Constitutional:  Negative for fever and malaise/fatigue.  HENT:  Negative for congestion, sinus pain and sore throat.   Eyes:  Negative for blurred vision.  Respiratory:  Negative for cough, shortness of breath and wheezing.   Cardiovascular:  Negative for chest pain, palpitations and leg swelling.  Gastrointestinal:  Negative for abdominal pain, blood in stool, constipation, diarrhea, nausea and vomiting.  Genitourinary:  Negative for dysuria, frequency and hematuria.  Musculoskeletal:  Negative for falls.       (-)new muscle pain (-)new joint pain  Skin:  Negative for rash.       (-)New moles  Neurological:  Negative for dizziness, loss of consciousness and headaches.  Endo/Heme/Allergies:  Negative for environmental allergies.  Psychiatric/Behavioral:  Negative for depression. The patient is not  nervous/anxious.        Objective:    Physical Exam Vitals and nursing note reviewed.  Constitutional:      General: She is not in acute distress.    Appearance: Normal appearance. She is not ill-appearing.  HENT:     Head: Normocephalic and atraumatic.     Right Ear: Tympanic membrane, ear canal and external ear normal.     Left Ear: Tympanic membrane, ear canal and external ear normal.     Mouth/Throat:     Mouth: Mucous membranes are moist.     Pharynx: Oropharynx is clear. No oropharyngeal exudate or posterior oropharyngeal erythema.  Eyes:     Extraocular Movements: Extraocular movements intact.  Pupils: Pupils are equal, round, and reactive to light.  Cardiovascular:     Rate and Rhythm: Normal rate and regular rhythm.     Heart sounds: Normal heart sounds. No murmur heard. Pulmonary:     Effort: Pulmonary effort is normal. No respiratory distress.     Breath sounds: Normal breath sounds. No wheezing or rales.  Abdominal:     General: There is no distension.     Palpations: Abdomen is soft.     Tenderness: There is no abdominal tenderness. There is no guarding.  Musculoskeletal:     Cervical back: Normal range of motion and neck supple.  Skin:    General: Skin is warm.  Neurological:     General: No focal deficit present.     Mental Status: She is alert and oriented to person, place, and time.  Psychiatric:        Mood and Affect: Mood normal.        Behavior: Behavior normal.     BP 108/80 (BP Location: Left Arm, Patient Position: Sitting, Cuff Size: Large)   Pulse 80   Temp 98.6 F (37 C) (Oral)   Resp 18   Ht 5\' 6"  (1.676 m)   Wt 202 lb 6.4 oz (91.8 kg)   SpO2 97%   BMI 32.67 kg/m  Wt Readings from Last 3 Encounters:  06/04/22 202 lb 6.4 oz (91.8 kg)  12/19/21 217 lb (98.4 kg)  07/03/21 215 lb (97.5 kg)       Assessment & Plan:  Preventative health care Assessment & Plan: Ghm utd Check labs  See AVS  Health Maintenance  Topic Date Due    Zoster Vaccines- Shingrix (1 of 2) Never done   COVID-19 Vaccine (4 - 2023-24 season) 11/02/2021   HIV Screening  04/16/2023 (Originally 11/02/1986)   PAP SMEAR-Modifier  03/06/2026 (Originally 04/06/2021)   MAMMOGRAM  07/14/2022   INFLUENZA VACCINE  10/03/2022   COLONOSCOPY (Pts 45-33yrs Insurance coverage will need to be confirmed)  07/04/2031   DTaP/Tdap/Td (3 - Td or Tdap) 06/03/2032   Hepatitis C Screening  Completed   HPV VACCINES  Aged Out     Orders: -     CBC with Differential/Platelet -     Comprehensive metabolic panel -     Lipid panel -     TSH  Essential hypertension Assessment & Plan: Well controlled, no changes to meds. Encouraged heart healthy diet such as the DASH diet and exercise as tolerated.    Orders: -     amLODIPine Besylate; Take 1 tablet (10 mg total) by mouth daily.  Dispense: 90 tablet; Refill: 1 -     hydroCHLOROthiazide; Take 1 tablet (25 mg total) by mouth daily.  Dispense: 90 tablet; Refill: 3 -     CBC with Differential/Platelet -     Comprehensive metabolic panel -     Lipid panel -     TSH  Gastroesophageal reflux disease, unspecified whether esophagitis present -     Omeprazole; TAKE 1 CAPSULE BY MOUTH EVERY DAY  Dispense: 90 capsule; Refill: 3  Hypokalemia Assessment & Plan: Check labs   Orders: -     Potassium Chloride Crys ER; Take 1 tablet (20 mEq total) by mouth daily.  Dispense: 90 tablet; Refill: 3 -     Comprehensive metabolic panel  Acute pain of right knee -     traMADol HCl; Take 1 tablet (50 mg total) by mouth every 8 (eight) hours as needed.  Dispense:  28 tablet; Refill: 2  Hypothyroidism, unspecified type Assessment & Plan: Check labs  Lab Results  Component Value Date   TSH 3.39 04/26/2022     Orders: -     TSH  Hyperlipidemia, unspecified hyperlipidemia type Assessment & Plan: Lab Results  Component Value Date   CHOL 222 (H) 05/10/2021   HDL 73.50 05/10/2021   LDLCALC 134 (H) 05/10/2021   LDLDIRECT 116.6  03/14/2011   TRIG 76.0 05/10/2021   CHOLHDL 3 05/10/2021     Orders: -     Lipid panel  Need for tetanus booster -     Tdap vaccine greater than or equal to 7yo IM    I, Ann Held, DO, personally preformed the services described in this documentation.  All medical record entries made by the scribe were at my direction and in my presence.  I have reviewed the chart and discharge instructions (if applicable) and agree that the record reflects my personal performance and is accurate and complete. 06/04/2022   I,Shehryar Baig,acting as a scribe for Ann Held, DO.,have documented all relevant documentation on the behalf of Ann Held, DO,as directed by  Ann Held, DO while in the presence of Ann Held, DO.   Ann Held, DO

## 2022-06-04 NOTE — Assessment & Plan Note (Signed)
Well controlled, no changes to meds. Encouraged heart healthy diet such as the DASH diet and exercise as tolerated.  °

## 2022-06-04 NOTE — Assessment & Plan Note (Signed)
Check labs 

## 2022-06-04 NOTE — Assessment & Plan Note (Signed)
Lab Results  Component Value Date   CHOL 222 (H) 05/10/2021   HDL 73.50 05/10/2021   LDLCALC 134 (H) 05/10/2021   LDLDIRECT 116.6 03/14/2011   TRIG 76.0 05/10/2021   CHOLHDL 3 05/10/2021

## 2022-06-04 NOTE — Patient Instructions (Signed)
Preventive Care 51-51 Years Old, Female Preventive care refers to lifestyle choices and visits with your health care provider that can promote health and wellness. Preventive care visits are also called wellness exams. What can I expect for my preventive care visit? Counseling Your health care provider may ask you questions about your: Medical history, including: Past medical problems. Family medical history. Pregnancy history. Current health, including: Menstrual cycle. Method of birth control. Emotional well-being. Home life and relationship well-being. Sexual activity and sexual health. Lifestyle, including: Alcohol, nicotine or tobacco, and drug use. Access to firearms. Diet, exercise, and sleep habits. Work and work environment. Sunscreen use. Safety issues such as seatbelt and bike helmet use. Physical exam Your health care provider will check your: Height and weight. These may be used to calculate your BMI (body mass index). BMI is a measurement that tells if you are at a healthy weight. Waist circumference. This measures the distance around your waistline. This measurement also tells if you are at a healthy weight and may help predict your risk of certain diseases, such as type 2 diabetes and high blood pressure. Heart rate and blood pressure. Body temperature. Skin for abnormal spots. What immunizations do I need?  Vaccines are usually given at various ages, according to a schedule. Your health care provider will recommend vaccines for you based on your age, medical history, and lifestyle or other factors, such as travel or where you work. What tests do I need? Screening Your health care provider may recommend screening tests for certain conditions. This may include: Lipid and cholesterol levels. Diabetes screening. This is done by checking your blood sugar (glucose) after you have not eaten for a while (fasting). Pelvic exam and Pap test. Hepatitis B test. Hepatitis C  test. HIV (human immunodeficiency virus) test. STI (sexually transmitted infection) testing, if you are at risk. Lung cancer screening. Colorectal cancer screening. Mammogram. Talk with your health care provider about when you should start having regular mammograms. This may depend on whether you have a family history of breast cancer. BRCA-related cancer screening. This may be done if you have a family history of breast, ovarian, tubal, or peritoneal cancers. Bone density scan. This is done to screen for osteoporosis. Talk with your health care provider about your test results, treatment options, and if necessary, the need for more tests. Follow these instructions at home: Eating and drinking  Eat a diet that includes fresh fruits and vegetables, whole grains, lean protein, and low-fat dairy products. Take vitamin and mineral supplements as recommended by your health care provider. Do not drink alcohol if: Your health care provider tells you not to drink. You are pregnant, may be pregnant, or are planning to become pregnant. If you drink alcohol: Limit how much you have to 0-1 drink a day. Know how much alcohol is in your drink. In the [place] one drink equals one 12 oz bottle of beer (355 mL), one 5 oz glass of wine (148 mL), or one 1 oz glass of hard liquor (44 mL). Lifestyle Brush your teeth every morning and night with fluoride toothpaste. Floss one time each day. Exercise for at least 30 minutes 5 or more days each week. Do not use any products that contain nicotine or tobacco. These products include cigarettes, chewing tobacco, and vaping devices, such as e-cigarettes. If you need help quitting, ask your health care provider. Do not use drugs. If you are sexually active, practice safe sex. Use a condom or other form of protection to   prevent STIs. If you do not wish to become pregnant, use a form of birth control. If you plan to become pregnant, see your health care provider for a  prepregnancy visit. Take aspirin only as told by your health care provider. Make sure that you understand how much to take and what form to take. Work with your health care provider to find out whether it is safe and beneficial for you to take aspirin daily. Find healthy ways to manage stress, such as: Meditation, yoga, or listening to music. Journaling. Talking to a trusted person. Spending time with friends and family. Minimize exposure to UV radiation to reduce your risk of skin cancer. Safety Always wear your seat belt while driving or riding in a vehicle. Do not drive: If you have been drinking alcohol. Do not ride with someone who has been drinking. When you are tired or distracted. While texting. If you have been using any mind-altering substances or drugs. Wear a helmet and other protective equipment during sports activities. If you have firearms in your house, make sure you follow all gun safety procedures. Seek help if you have been physically or sexually abused. What's next? Visit your health care provider once a year for an annual wellness visit. Ask your health care provider how often you should have your eyes and teeth checked. Stay up to date on all vaccines. This information is not intended to replace advice given to you by your health care provider. Make sure you discuss any questions you have with your health care provider. Document Revised: 08/16/2020 Document Reviewed: 08/16/2020 Elsevier Patient Education  2023 [employer]  

## 2022-06-04 NOTE — Assessment & Plan Note (Signed)
Check labs  Lab Results  Component Value Date   TSH 3.39 04/26/2022

## 2022-06-19 ENCOUNTER — Ambulatory Visit: Payer: No Typology Code available for payment source | Admitting: Internal Medicine

## 2022-07-22 ENCOUNTER — Telehealth: Payer: No Typology Code available for payment source | Admitting: Family Medicine

## 2022-07-22 DIAGNOSIS — R051 Acute cough: Secondary | ICD-10-CM

## 2022-07-22 MED ORDER — BENZONATATE 100 MG PO CAPS: 100.0000 mg | ORAL_CAPSULE | Freq: Three times a day (TID) | ORAL | 0 refills | Status: AC | PRN

## 2022-07-22 NOTE — Progress Notes (Signed)
E-Visit for Cough  We are sorry that you are not feeling well.  Here is how we plan to help!  Based on your presentation I believe you most likely have A cough due to a virus.  This is called viral bronchitis and is best treated by rest, plenty of fluids and control of the cough.  You may use Ibuprofen or Tylenol as directed to help your symptoms.     In addition you may use A non-prescription cough medication called Robitussin DAC. Take 2 teaspoons every 8 hours or Delsym: take 2 teaspoons every 12 hours. and A prescription cough medication called Tessalon Perles 100mg . You may take 1-2 capsules every 8 hours as needed for your cough.   From your responses in the eVisit questionnaire you describe inflammation in the upper respiratory tract which is causing a significant cough.  This is commonly called Bronchitis and has four common causes:   Allergies Viral Infections Acid Reflux Bacterial Infection Allergies, viruses and acid reflux are treated by controlling symptoms or eliminating the cause. An example might be a cough caused by taking certain blood pressure medications. You stop the cough by changing the medication. Another example might be a cough caused by acid reflux. Controlling the reflux helps control the cough.  USE OF BRONCHODILATOR ("RESCUE") INHALERS: There is a risk from using your bronchodilator too frequently.  The risk is that over-reliance on a medication which only relaxes the muscles surrounding the breathing tubes can reduce the effectiveness of medications prescribed to reduce swelling and congestion of the tubes themselves.  Although you feel brief relief from the bronchodilator inhaler, your asthma may actually be worsening with the tubes becoming more swollen and filled with mucus.  This can delay other crucial treatments, such as oral steroid medications. If you need to use a bronchodilator inhaler daily, several times per day, you should discuss this with your provider.   There are probably better treatments that could be used to keep your asthma under control.     HOME CARE Only take medications as instructed by your medical team. Complete the entire course of an antibiotic. Drink plenty of fluids and get plenty of rest. Avoid close contacts especially the very young and the elderly Cover your mouth if you cough or cough into your sleeve. Always remember to wash your hands A steam or ultrasonic humidifier can help congestion.   GET HELP RIGHT AWAY IF: You develop worsening fever. You become short of breath You cough up blood. Your symptoms persist after you have completed your treatment plan MAKE SURE YOU  Understand these instructions. Will watch your condition. Will get help right away if you are not doing well or get worse.    Thank you for choosing an e-visit.  Your e-visit answers were reviewed by a board certified advanced clinical practitioner to complete your personal care plan. Depending upon the condition, your plan could have included both over the counter or prescription medications.  Please review your pharmacy choice. Make sure the pharmacy is open so you can pick up prescription now. If there is a problem, you may contact your provider through [employer] and have the prescription routed to another pharmacy.  Your safety is important to [place]. If you have drug allergies check your prescription carefully.   For the next 24 hours you can use MyChart to ask questions about today's visit, request a non-urgent call back, or ask for a work or school excuse. You will get an email in  the next two days asking about your experience. I hope that your e-visit has been valuable and will speed your recovery.  I provided 5 minutes of non face-to-face time during this encounter for chart review, medication and order placement, as well as and documentation.

## 2022-07-22 NOTE — Addendum Note (Signed)
Addended by: Freddy Finner on: 07/22/2022 09:04 AM   Modules accepted: Orders

## 2022-09-02 ENCOUNTER — Other Ambulatory Visit: Payer: Self-pay | Admitting: Obstetrics and Gynecology

## 2022-09-02 DIAGNOSIS — Z0000 Encounter for general adult medical examination without abnormal findings: Secondary | ICD-10-CM

## 2022-09-03 ENCOUNTER — Encounter: Payer: Self-pay | Admitting: Family Medicine

## 2022-09-03 ENCOUNTER — Ambulatory Visit: Payer: No Typology Code available for payment source | Admitting: Family Medicine

## 2022-09-03 VITALS — BP 118/80 | HR 88 | Temp 98.4°F | Resp 18 | Ht 66.0 in | Wt 200.8 lb

## 2022-09-03 DIAGNOSIS — M1712 Unilateral primary osteoarthritis, left knee: Secondary | ICD-10-CM

## 2022-09-03 DIAGNOSIS — K219 Gastro-esophageal reflux disease without esophagitis: Secondary | ICD-10-CM | POA: Diagnosis not present

## 2022-09-03 MED ORDER — OMEPRAZOLE 20 MG PO CPDR: DELAYED_RELEASE_CAPSULE | ORAL | 3 refills | Status: DC

## 2022-09-03 NOTE — Patient Instructions (Signed)
Food Choices for Gastroesophageal Reflux Disease, Adult When you have gastroesophageal reflux disease (GERD), the foods you eat and your eating habits are very important. Choosing the right foods can help ease the discomfort of GERD. Consider working with a dietitian to help you make healthy food choices. What are tips for following this plan? Reading food labels Look for foods that are low in saturated fat. Foods that have less than 5% of daily value (DV) of fat and 0 g of trans fats may help with your symptoms. Cooking Cook foods using methods other than frying. This may include baking, steaming, grilling, or broiling. These are all methods that do not need a lot of fat for cooking. To add flavor, try to use herbs that are low in spice and acidity. Meal planning  Choose healthy foods that are low in fat, such as fruits, vegetables, whole grains, low-fat dairy products, lean meats, fish, and poultry. Eat frequent, small meals instead of three large meals each day. Eat your meals slowly, in a relaxed setting. Avoid bending over or lying down until 2-3 hours after eating. Limit high-fat foods such as fatty meats or fried foods. Limit your intake of fatty foods, such as oils, butter, and shortening. Avoid the following as told by your health care provider: Foods that cause symptoms. These may be different for different people. Keep a food diary to keep track of foods that cause symptoms. Alcohol. Drinking large amounts of liquid with meals. Eating meals during the 2-3 hours before bed. Lifestyle Maintain a healthy weight. Ask your health care provider what weight is healthy for you. If you need to lose weight, work with your health care provider to do so safely. Exercise for at least 30 minutes on 5 or more days each week, or as told by your health care provider. Avoid wearing clothes that fit tightly around your waist and chest. Do not use any products that contain nicotine or tobacco. These  products include cigarettes, chewing tobacco, and vaping devices, such as e-cigarettes. If you need help quitting, ask your health care provider. Sleep with the head of your bed raised. Use a wedge under the mattress or blocks under the bed frame to raise the head of the bed. Chew sugar-free gum after mealtimes. What foods should I eat?  Eat a healthy, well-balanced diet of fruits, vegetables, whole grains, low-fat dairy products, lean meats, fish, and poultry. Each person is different. Foods that may trigger symptoms in one person may not trigger any symptoms in another person. Work with your health care provider to identify foods that are safe for you. The items listed above may not be a complete list of recommended foods and beverages. Contact a dietitian for more information. What foods should I avoid? Limiting some of these foods may help manage the symptoms of GERD. Everyone is different. Consult a dietitian or your health care provider to help you identify the exact foods to avoid, if any. Fruits Any fruits prepared with added fat. Any fruits that cause symptoms. For some people this may include citrus fruits, such as oranges, grapefruit, pineapple, and lemons. Vegetables Deep-fried vegetables. French fries. Any vegetables prepared with added fat. Any vegetables that cause symptoms. For some people, this may include tomatoes and tomato products, chili peppers, onions and garlic, and horseradish. Grains Pastries or quick breads with added fat. Meats and other proteins High-fat meats, such as fatty beef or pork, hot dogs, ribs, ham, sausage, salami, and bacon. Fried meat or protein, including   fried fish and fried chicken. Nuts and nut butters, in large amounts. Dairy Whole milk and chocolate milk. Sour cream. Cream. Ice cream. Cream cheese. Milkshakes. Fats and oils Butter. Margarine. Shortening. Ghee. Beverages Coffee and tea, with or without caffeine. Carbonated beverages. Sodas. Energy  drinks. Fruit juice made with acidic fruits, such as orange or grapefruit. Tomato juice. Alcoholic drinks. Sweets and desserts Chocolate and cocoa. Donuts. Seasonings and condiments Pepper. Peppermint and spearmint. Added salt. Any condiments, herbs, or seasonings that cause symptoms. For some people, this may include curry, hot sauce, or vinegar-based salad dressings. The items listed above may not be a complete list of foods and beverages to avoid. Contact a dietitian for more information. Questions to ask your health care provider Diet and lifestyle changes are usually the first steps that are taken to manage symptoms of GERD. If diet and lifestyle changes do not improve your symptoms, talk with your health care provider about taking medicines. Where to find more information [employer]: aboutgerd.org Summary When you have gastroesophageal reflux disease (GERD), food and lifestyle choices may be very helpful in easing the discomfort of GERD. Eat frequent, small meals instead of three large meals each day. Eat your meals slowly, in a relaxed setting. Avoid bending over or lying down until 2-3 hours after eating. Limit high-fat foods such as fatty meats or fried foods. This information is not intended to replace advice given to you by your health care provider. Make sure you discuss any questions you have with your health care provider. Document Revised: 08/30/2019 Document Reviewed: 08/30/2019 Elsevier Patient Education  2024 [employer].  

## 2022-09-03 NOTE — Progress Notes (Signed)
Established Patient Office Visit  Subjective   Patient ID: [name], female    DOB: [birth year]  Age: 52 y.o. MRN: [id number]  Chief Complaint  Patient presents with  . Gastroesophageal Reflux    Pt states reflux is flaring up. Pt feels that omeprazole isn't working like it used too.   . Vaginitis          HPI Discussed the use of AI scribe software for clinical note transcription with the patient, who gave verbal consent to proceed.  History of Present Illness   The patient, with a history of lap band surgery, presents with worsening reflux symptoms despite long-term use of metoprazole. She reports that the relief she initially experienced with the medication has diminished over time. She acknowledges a daily sweet tea habit, which she is finding difficult to break despite understanding that the caffeine content may be exacerbating her reflux symptoms. She has attempted to reduce her intake but experienced headaches, likely due to caffeine withdrawal. She reports no significant intake of other common reflux triggers such as soda, alcohol, fatty foods, or oils.  In addition to her reflux symptoms, the patient reports a recent incident where her left knee buckled, causing her to fall. She has previously received a shot for knee pain, which was effective, but the recent buckling incidents are a new development. She has been performing strengthening exercises as advised by her orthopedic doctor and is hopeful that continued weight loss will improve her knee stability.      Patient Active Problem List   Diagnosis Date Noted  . Gastroesophageal reflux disease 06/04/2022  . Hypokalemia 06/04/2022  . Acute pain of right knee 06/04/2022  . Preventative health care 06/04/2022  . Need for tetanus booster 06/04/2022  . Family history of diabetes mellitus (DM) 03/10/2018  . Abnormal urine odor 11/23/2016  . Influenza with respiratory symptoms 04/29/2016  . Nausea with vomiting  10/13/2014  . Upper respiratory infection 12/30/2013  . 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 ACUTE PHARYNGITIS 05/31/2009  . COLD SORE 05/02/2009  . BLISTERS 05/02/2009  . ANGIOEDEMA 05/02/2009  . UTI 03/16/2009  . URI 08/12/2008  . Hyperlipidemia 08/24/2007  . SHOULDER STRAIN 04/02/2007  . Hypothyroidism 09/03/2006  . Morbid obesity (HCC) 09/03/2006  . ALLERGIC RHINITIS, HX OF 09/03/2006  . Essential hypertension 08/13/2006   Past Medical History:  Diagnosis Date  . Allergy    SEASONAL,POLLEN,GRASS  . Arthritis    KNEES,LEFT  . Contact lens/glasses fitting   . GERD (gastroesophageal reflux disease)   . Hypertension   . Thyroid disease    Past Surgical History:  Procedure Laterality Date  . ABDOMINAL HYSTERECTOMY  01/2010   [facility]  . BREAST BIOPSY Left 12/22/220   FIBROCYSTIC CHANGES INCLUDING APOCRINE METAPLASIA  . CESAREAN SECTION  12/2003  . LAPAROSCOPIC GASTRIC BANDING  10/12/2007  . myomectomy     2x's. Patient unsure of date of procedure based on history form dated 11/10/2009.   Social History   Tobacco Use  . Smoking status: Never    Passive exposure: Never  . Smokeless tobacco: Never  Vaping Use  . Vaping Use: Never used  Substance Use Topics  . Alcohol use: No  . Drug use: No   Social History   Socioeconomic History  . Marital status: Divorced    Spouse name: Not on file  . Number of children: Not on file  . Years of education: Not on file  . Highest education level: Not on  file  Occupational History  . Occupation: [employer]  Tobacco Use  . Smoking status: Never    Passive exposure: Never  . Smokeless tobacco: Never  Vaping Use  . Vaping Use: Never used  Substance and Sexual Activity  . Alcohol use: No  . Drug use: No  . Sexual activity: Not on file  Other Topics Concern  . Not on file  Social History Narrative   Exercising---treadmill 3-4 a week   Social Determinants of Health   Financial Resource Strain: Not on file  Food  Insecurity: Not on file  Transportation Needs: Not on file  Physical Activity: Not on file  Stress: Not on file  Social Connections: Not on file  Intimate Partner Violence: Not on file   Family Status  Relation Name Status  . Mother  Alive  . Father  Alive  . Sister  Alive  . Mat Aunt  Deceased  . Neg Hx  (Not Specified)   Family History  Problem Relation Age of Onset  . Hypertension Mother   . Thyroid disease Mother   . Breast cancer Mother 74  . Hypertension Father   . Hypertension Sister   . Colon cancer Maternal Aunt   . Cancer Maternal Aunt        colon  . Crohn's disease Neg Hx   . Esophageal cancer Neg Hx   . Rectal cancer Neg Hx   . Stomach cancer Neg Hx    Allergies  Allergen Reactions  . Diflucan [Fluconazole] Swelling    Blister and swelling in mouth  . Penicillins Rash      Review of Systems  Constitutional:  Negative for fever and malaise/fatigue.  HENT:  Negative for congestion.   Eyes:  Negative for blurred vision.  Respiratory:  Negative for cough and shortness of breath.   Cardiovascular:  Negative for chest pain, palpitations and leg swelling.  Gastrointestinal:  Positive for heartburn. Negative for abdominal pain, blood in stool, constipation, diarrhea, nausea and vomiting.  Genitourinary:  Negative for dysuria and frequency.  Musculoskeletal:  Positive for falls and joint pain. Negative for back pain.  Skin:  Negative for rash.  Neurological:  Negative for dizziness, loss of consciousness and headaches.  Endo/Heme/Allergies:  Negative for environmental allergies.  Psychiatric/Behavioral:  Negative for depression. The patient is not nervous/anxious.       Objective:     BP 118/80 (BP Location: Right Arm, Patient Position: Sitting, Cuff Size: Large)   Pulse 88   Temp 98.4 F (36.9 C) (Oral)   Resp 18   Ht 5\' 6"  (1.676 m)   Wt 200 lb 12.8 oz (91.1 kg)   SpO2 99%   BMI 32.41 kg/m  BP Readings from Last 3 Encounters:  09/03/22 118/80   06/04/22 108/80  12/19/21 120/80   Wt Readings from Last 3 Encounters:  09/03/22 200 lb 12.8 oz (91.1 kg)  06/04/22 202 lb 6.4 oz (91.8 kg)  12/19/21 217 lb (98.4 kg)   SpO2 Readings from Last 3 Encounters:  09/03/22 99%  06/04/22 97%  12/19/21 98%      Physical Exam Vitals and nursing note reviewed.  Constitutional:      General: She is not in acute distress.    Appearance: Normal appearance. She is well-developed.  HENT:     Head: Normocephalic and atraumatic.  Eyes:     General: No scleral icterus.       Right eye: No discharge.        Left eye:  No discharge.  Cardiovascular:     Rate and Rhythm: Normal rate and regular rhythm.     Heart sounds: No murmur heard. Pulmonary:     Effort: Pulmonary effort is normal. No respiratory distress.     Breath sounds: Normal breath sounds.  Musculoskeletal:        General: Normal range of motion.     Cervical back: Normal range of motion and neck supple.     Right lower leg: No edema.     Left lower leg: No edema.  Skin:    General: Skin is warm and dry.  Neurological:     Mental Status: She is alert and oriented to person, place, and time.  Psychiatric:        Mood and Affect: Mood normal.        Behavior: Behavior normal.        Thought Content: Thought content normal.        Judgment: Judgment normal.     No results found for any visits on 09/03/22.  Last CBC Lab Results  Component Value Date   WBC 4.8 06/04/2022   HGB 12.0 06/04/2022   HCT 37.5 06/04/2022   MCV 86.0 06/04/2022   MCH 27.9 12/27/2019   RDW 15.5 06/04/2022   PLT 290.0 06/04/2022   Last metabolic panel Lab Results  Component Value Date   GLUCOSE 87 06/04/2022   NA 140 06/04/2022   K 3.8 06/04/2022   CL 103 06/04/2022   CO2 30 06/04/2022   BUN 14 06/04/2022   CREATININE 0.79 06/04/2022   GFR 87.12 06/04/2022   CALCIUM 10.0 06/04/2022   PROT 7.2 06/04/2022   ALBUMIN 4.3 06/04/2022   BILITOT 0.5 06/04/2022   ALKPHOS 108 06/04/2022    AST 13 06/04/2022   ALT 10 06/04/2022   Last lipids Lab Results  Component Value Date   CHOL 266 (H) 06/04/2022   HDL 74.50 06/04/2022   LDLCALC 170 (H) 06/04/2022   LDLDIRECT 116.6 03/14/2011   TRIG 108.0 06/04/2022   CHOLHDL 4 06/04/2022   Last hemoglobin A1c Lab Results  Component Value Date   HGBA1C 5.9 12/19/2021   Last thyroid functions Lab Results  Component Value Date   TSH 5.93 (H) 06/04/2022   Last vitamin D No results found for: "25OHVITD2", "25OHVITD3", "VD25OH" Last vitamin B12 and Folate No results found for: "VITAMINB12", "FOLATE"    The 10-year ASCVD risk score (Arnett DK, et al., 2019) is: 2.1%    Assessment & Plan:   Problem List Items Addressed This Visit       Unprioritized   Gastroesophageal reflux disease   Relevant Medications   omeprazole (PRILOSEC) 20 MG capsule   Other Relevant Orders   H. pylori antibody, IgG   Other Visit Diagnoses     Primary osteoarthritis of left knee    -  Primary   Relevant Orders   Ambulatory referral to Orthopedic Surgery     Assessment and Plan    Gastroesophageal Reflux Disease (GERD): Persistent symptoms despite long-term use of Omeprazole 20mg  daily. Likely exacerbated by high caffeine intake from sweet tea. -Increase Omeprazole to 40mg  daily (20mg  in the morning and 20mg  at night). -Order labs to rule out development of an ulcer. -Encourage patient to try decaffeinated tea and continue dietary modifications.  Knee Instability: Left knee buckling, causing falls. No pain reported. Patient has a history of knee issues and has received injections in the past. -Refer back to Orthopedic specialist, Dr. Luiz Blare, for further  evaluation and management. -Provide application for handicap parking permit due to instability and risk of falls.  Follow-up: Monitor response to increased Omeprazole dose and results of labs for potential ulcer.        No follow-ups on file.    Donato Schultz, DO

## 2022-09-04 LAB — H. PYLORI ANTIBODY, IGG: H Pylori IgG: NEGATIVE

## 2022-09-16 ENCOUNTER — Ambulatory Visit
Admission: RE | Admit: 2022-09-16 | Discharge: 2022-09-16 | Disposition: A | Discharge status: Home / Self Care | Payer: No Typology Code available for payment source | Source: Ambulatory Visit | Attending: Obstetrics and Gynecology | Admitting: Obstetrics and Gynecology

## 2022-09-16 DIAGNOSIS — Z0000 Encounter for general adult medical examination without abnormal findings: Secondary | ICD-10-CM

## 2022-11-02 ENCOUNTER — Other Ambulatory Visit: Payer: Self-pay | Admitting: Internal Medicine

## 2022-11-02 DIAGNOSIS — E039 Hypothyroidism, unspecified: Secondary | ICD-10-CM

## 2022-12-30 ENCOUNTER — Encounter: Payer: Self-pay | Admitting: Family Medicine

## 2022-12-31 ENCOUNTER — Ambulatory Visit: Payer: No Typology Code available for payment source | Admitting: Internal Medicine

## 2022-12-31 ENCOUNTER — Encounter: Payer: Self-pay | Admitting: Family Medicine

## 2022-12-31 ENCOUNTER — Telehealth (INDEPENDENT_AMBULATORY_CARE_PROVIDER_SITE_OTHER): Payer: No Typology Code available for payment source | Admitting: Family Medicine

## 2022-12-31 VITALS — BP 119/76 | HR 96 | Temp 99.6°F

## 2022-12-31 DIAGNOSIS — U071 COVID-19: Secondary | ICD-10-CM | POA: Diagnosis not present

## 2022-12-31 MED ORDER — PROMETHAZINE-DM 6.25-15 MG/5ML PO SYRP: 5.0000 mL | ORAL_SOLUTION | Freq: Four times a day (QID) | ORAL | 0 refills | Status: AC | PRN

## 2022-12-31 MED ORDER — NIRMATRELVIR/RITONAVIR (PAXLOVID)TABLET: 3.0000 | ORAL_TABLET | Freq: Two times a day (BID) | ORAL | 0 refills | Status: AC

## 2022-12-31 NOTE — Progress Notes (Signed)
MyChart Video Visit    Virtual Visit via Video Note   This patient is at least at moderate risk for complications without adequate follow up. This format is felt to be most appropriate for this patient at this time. Physical exam was limited by quality of the video and audio technology used for the visit. Herbert Seta was able to get the patient set up on a video visit.  Patient location: home Patient and provider in visit Provider location: Office  I discussed the limitations of evaluation and management by telemedicine and the availability of in person appointments. The patient expressed understanding and agreed to proceed.  Visit Date: 12/31/2022  Today's healthcare provider: Donato Schultz, DO     Subjective:    Patient ID: [name], female    DOB: [birth year], 52 y.o.   MRN: [id number]  Chief Complaint  Patient presents with   Covid Positive    HPI Patient is in today for tx covid. Discussed the use of AI scribe software for clinical note transcription with the patient, who gave verbal consent to proceed.  History of Present Illness   The patient, with a history of COVID-19, presents with a recent positive test result. She reports initial symptoms of body aches and fatigue, with a peak fever of 102.52F on Saturday. As of the time of the encounter, the patient's symptoms have evolved to include nasal congestion, a cough, and rhinorrhea. She also reports a decreased appetite and altered taste. The patient's current temperature is 99.79F, blood pressure is 119/76, and pulse is 96. She has been managing her symptoms with Tylenol.       Past Medical History:  Diagnosis Date   Allergy    SEASONAL,POLLEN,GRASS   Arthritis    KNEES,LEFT   Contact lens/glasses fitting    GERD (gastroesophageal reflux disease)    Hypertension    Thyroid disease     Past Surgical History:  Procedure Laterality Date   ABDOMINAL HYSTERECTOMY  01/2010   [facility]   BREAST BIOPSY Left  12/22/220   FIBROCYSTIC CHANGES INCLUDING APOCRINE METAPLASIA   CESAREAN SECTION  12/2003   LAPAROSCOPIC GASTRIC BANDING  10/12/2007   myomectomy     2x's. Patient unsure of date of procedure based on history form dated 11/10/2009.    Family History  Problem Relation Age of Onset   Hypertension Mother    Thyroid disease Mother    Breast cancer Mother 52   Hypertension Father    Hypertension Sister    Colon cancer Maternal Aunt    Cancer Maternal Aunt        colon   Crohn's disease Neg Hx    Esophageal cancer Neg Hx    Rectal cancer Neg Hx    Stomach cancer Neg Hx     Social History   Socioeconomic History   Marital status: Divorced    Spouse name: Not on file   Number of children: Not on file   Years of education: Not on file   Highest education level: Not on file  Occupational History   Occupation: [employer]  Tobacco Use   Smoking status: Never    Passive exposure: Never   Smokeless tobacco: Never  Vaping Use   Vaping status: Never Used  Substance and Sexual Activity   Alcohol use: No   Drug use: No   Sexual activity: Not on file  Other Topics Concern   Not on file  Social History Narrative   Exercising---treadmill 3-4 a  week   Social Determinants of Health   Financial Resource Strain: Not on file  Food Insecurity: Not on file  Transportation Needs: Not on file  Physical Activity: Not on file  Stress: Not on file  Social Connections: Not on file  Intimate Partner Violence: Not on file    Outpatient Medications Prior to Visit  Medication Sig Dispense Refill   amLODipine (NORVASC) 10 MG tablet Take 1 tablet (10 mg total) by mouth daily. 90 tablet 1   hydrochlorothiazide (HYDRODIURIL) 25 MG tablet Take 1 tablet (25 mg total) by mouth daily. 90 tablet 3   levothyroxine (SYNTHROID) 175 MCG tablet TAKE 1 TABLET BY MOUTH EVERY DAY 30 tablet 1   omeprazole (PRILOSEC) 20 MG capsule TAKE 1 CAPSULE BY MOUTH bid 180 capsule 3   potassium chloride SA (KLOR-CON M20)  20 MEQ tablet Take 1 tablet (20 mEq total) by mouth daily. 90 tablet 3   promethazine (PHENERGAN) 25 MG tablet TAKE 1 TABLET BY MOUTH EVERY 6 HOURS AS NEEDED FOR NAUSEA 30 tablet 1   traMADol (ULTRAM) 50 MG tablet Take 1 tablet (50 mg total) by mouth every 8 (eight) hours as needed. 28 tablet 2   albuterol (VENTOLIN HFA) 108 (90 Base) MCG/ACT inhaler Inhale 2 puffs into the lungs every 6 (six) hours as needed for wheezing or shortness of breath. (Patient not taking: Reported on 06/19/2021) 8 g 5   benzonatate (TESSALON) 100 MG capsule Take 1 capsule (100 mg total) by mouth 3 (three) times daily as needed for cough. (Patient not taking: Reported on 09/03/2022) 30 capsule 0   No facility-administered medications prior to visit.    Allergies  Allergen Reactions   Diflucan [Fluconazole] Swelling    Blister and swelling in mouth   Penicillins Rash    Review of Systems  Constitutional:  Positive for chills, fever and malaise/fatigue.  HENT:  Positive for congestion, sinus pain and sore throat.   Respiratory:  Positive for cough and sputum production.   Psychiatric/Behavioral: Negative.         Objective:    Physical Exam Vitals and nursing note reviewed.  Constitutional:      Appearance: She is ill-appearing.  Psychiatric:        Mood and Affect: Mood normal.        Behavior: Behavior normal.     BP 119/76   Pulse 96   Temp 99.6 F (37.6 C)  Wt Readings from Last 3 Encounters:  09/03/22 200 lb 12.8 oz (91.1 kg)  06/04/22 202 lb 6.4 oz (91.8 kg)  12/19/21 217 lb (98.4 kg)       Assessment & Plan:  COVID-19 -     nirmatrelvir/ritonavir; Take 3 tablets by mouth 2 (two) times daily for 5 days. (Take nirmatrelvir 150 mg two tablets twice daily for 5 days and ritonavir 100 mg one tablet twice daily for 5 days) Patient GFR is 87  Dispense: 30 tablet; Refill: 0 -     Promethazine-DM; Take 5 mLs by mouth 4 (four) times daily as needed.  Dispense: 118 mL; Refill: 0    Assessment and  Plan    COVID-19 Positive test with symptoms starting on Friday. Initial symptoms of body aches and fever, now with nasal congestion and cough. No severe symptoms reported. -Start Paxlovid as outpatient treatment for COVID-19. -Use Flonase or Nasacort for nasal congestion. -Use Mucinex or Delsym for cough during the day. -Use prescribed cough syrup at night, may cause drowsiness. -Quarantine for 5 days  from symptom onset, then can leave house with mask for additional 5 days. -Call if symptoms worsen or appear to be turning into an infection.  Loss of appetite Likely due to altered taste and smell from COVID-19 infection. -Encouraged to stay hydrated and eat as tolerated.       I discussed the assessment and treatment plan with the patient. The patient was provided an opportunity to ask questions and all were answered. The patient agreed with the plan and demonstrated an understanding of the instructions.   The patient was advised to call back or seek an in-person evaluation if the symptoms worsen or if the condition fails to improve as anticipated.  Donato Schultz, DO [facility] [phone] (phone) [phone] (fax)  [facility]

## 2023-01-04 ENCOUNTER — Other Ambulatory Visit: Payer: Self-pay | Admitting: Internal Medicine

## 2023-01-04 DIAGNOSIS — E039 Hypothyroidism, unspecified: Secondary | ICD-10-CM

## 2023-01-13 ENCOUNTER — Other Ambulatory Visit: Payer: Self-pay | Admitting: Internal Medicine

## 2023-01-13 DIAGNOSIS — E039 Hypothyroidism, unspecified: Secondary | ICD-10-CM

## 2023-01-16 ENCOUNTER — Telehealth: Payer: Self-pay | Admitting: Internal Medicine

## 2023-01-16 DIAGNOSIS — E039 Hypothyroidism, unspecified: Secondary | ICD-10-CM

## 2023-01-16 MED ORDER — LEVOTHYROXINE SODIUM 175 MCG PO TABS: 175.0000 ug | ORAL_TABLET | Freq: Every day | ORAL | 0 refills | Status: DC

## 2023-01-16 NOTE — Telephone Encounter (Signed)
Patient advising she has been with out medication X4 days please call Rx into [facility] [place] on [address].Marland KitchenMarland KitchenLevothyroxine need 90 day supply. Insurance will only pay for 90 day supply. Please advise when done.Marland Kitchen

## 2023-01-16 NOTE — Telephone Encounter (Signed)
Rx sent for 90 day supple but we have not seen her in 1 year. She needs to make an appr for more refills.

## 2023-01-28 ENCOUNTER — Ambulatory Visit (INDEPENDENT_AMBULATORY_CARE_PROVIDER_SITE_OTHER): Payer: No Typology Code available for payment source | Admitting: Internal Medicine

## 2023-01-28 ENCOUNTER — Encounter: Payer: Self-pay | Admitting: Internal Medicine

## 2023-01-28 VITALS — BP 118/72 | HR 95 | Ht 66.0 in | Wt 193.4 lb

## 2023-01-28 DIAGNOSIS — E66811 Obesity, class 1: Secondary | ICD-10-CM

## 2023-01-28 DIAGNOSIS — R7309 Other abnormal glucose: Secondary | ICD-10-CM | POA: Diagnosis not present

## 2023-01-28 DIAGNOSIS — E039 Hypothyroidism, unspecified: Secondary | ICD-10-CM | POA: Diagnosis not present

## 2023-01-28 NOTE — Patient Instructions (Signed)
Please continue Levothyroxine 175 mcg daily.  Take the thyroid hormone every day, with water, at least 30 minutes before breakfast, separated by at least 4 hours from: - acid reflux medications - calcium - iron - multivitamins  Please stop at the lab.  Please come back for a follow-up appointment in 6 months.

## 2023-01-28 NOTE — Progress Notes (Signed)
Patient ID: [name], female   DOB: 1970-09-14, 52 y.o.   MRN: [id number]  HPI  [name] is a 52 y.o.-year-old female, returning for f/u for hypothyroidism and history of elevated HbA1c. Last visit 1 year ago.  Interim hx: No increased urination, blurry vision, nausea, chest pain. She had a little more anxiety since last visit. She lost approximately 23 pounds in the last year.  She is eating less meat and cut out almost entirely sweet tea.  Reviewed history: Pt. has been dx with hypothyroidism in ~2005 >> started on Levothyroxine 112 mcg for "years", then increased up to 200 mcg.  She has a history of noncompliance with her levothyroxine, now taking it every day.  She was then on 150 mcg daily of LT4 but the TSH was very high when checked by PCP last summer and the LT4 dose was increased to 200 mcg daily at that time, however, patient recalls that at that time she had day vomiting 2/2 lap band too tight >> had it stretched since.  A TSH returned suppressed in 01/2019, after which the dose of levothyroxine was decreased.  However, we then need to increase the dose again as TSH increased.  Pt is on levothyroxine 175 mcg daily, taken: - missed 1 dose (refill pb) - first thing in am - fasting - no b'fast - at least 30 min from b'fast or has a brunch - no Ca, Fe, MVI, + PPIs more than 4 hours after levothyroxine (lunchtime) - not on Biotin now  Reviewed her TFTs: Lab Results  Component Value Date   TSH 5.93 (H) 06/04/2022   TSH 3.39 04/26/2022   TSH 17.09 (H) 12/19/2021   TSH 0.79 05/10/2021   TSH 3.38 10/31/2020   TSH 4.90 (H) 08/14/2020   TSH 11.46 (H) 06/22/2020   TSH 26.17 (H) 05/31/2020   TSH 20.09 (H) 04/04/2020   TSH 0.64 07/02/2019   FREET4 0.86 04/26/2022   FREET4 0.69 12/19/2021   FREET4 0.83 10/31/2020   FREET4 1.04 08/14/2020   FREET4 0.91 06/22/2020   FREET4 0.77 05/31/2020   FREET4 0.80 04/04/2020   FREET4 1.09 07/02/2019   FREET4 1.33 05/13/2019    FREET4 1.40 03/11/2019   Reviewed the report of her thyroid ultrasound: Thyroid U/S (02/20/2006): Heterogeneous thyroid, no nodules  Pt denies: - feeling nodules in neck - hoarseness - dysphagia - choking  She has + FH of thyroid disorders in: mother (nodule). No FH of thyroid cancer. No h/o radiation tx to head or neck. No herbal supplements. No Biotin use. No recent steroids use.   She has a history of lap band in 2009.  She lost approximately 100 pounds (from 300 pounds).  She gained some back afterwards.  Reviewed HbA1c levels: Lab Results  Component Value Date   HGBA1C 5.9 12/19/2021   HGBA1C 5.6 06/04/2021   HGBA1C 5.3 10/31/2020   HGBA1C 5.4 10/29/2019   HGBA1C 5.7 03/11/2019   HGBA1C 5.7 03/10/2018   Lab Results  Component Value Date   BUN 14 06/04/2022   Lab Results  Component Value Date   CREATININE 0.79 06/04/2022  No results found for: "MICRALBCREAT"  + HL: Lab Results  Component Value Date   CHOL 266 (H) 06/04/2022   HDL 74.50 06/04/2022   LDLCALC 170 (H) 06/04/2022   LDLDIRECT 116.6 03/14/2011   TRIG 108.0 06/04/2022   CHOLHDL 4 06/04/2022   Last eye exam: 12/2021: No DR reportedly.  She stopped beef and pork in 07/2019 -  restarted eating beef since.  ROS:  + see HPI  I reviewed pt's medications, allergies, PMH, social hx, family hx, and changes were documented in the history of present illness. Otherwise, unchanged from my initial visit note.   Past Medical History:  Diagnosis Date   Allergy    SEASONAL,POLLEN,GRASS   Arthritis    KNEES,LEFT   Contact lens/glasses fitting    GERD (gastroesophageal reflux disease)    Hypertension    Thyroid disease    Past Surgical History:  Procedure Laterality Date   ABDOMINAL HYSTERECTOMY  01/2010   Tah   BREAST BIOPSY Left 12/22/220   FIBROCYSTIC CHANGES INCLUDING APOCRINE METAPLASIA   CESAREAN SECTION  12/2003   LAPAROSCOPIC GASTRIC BANDING  10/12/2007   myomectomy     2x's. Patient  unsure of date of procedure based on history form dated 11/10/2009.   Social History   Social History   Marital Status: single    Spouse Name: N/A   Number of Children: 1   Occupational History   Customer svc supervisor   Social History Main Topics   Smoking status: Never Smoker    Smokeless tobacco: Never Used   Alcohol Use: No   Drug Use: No   Sexual Activity:    Partners: Male   Social History Narrative   Exercising---treadmill 4x a week   Current Outpatient Medications on File Prior to Visit  Medication Sig Dispense Refill   albuterol (VENTOLIN HFA) 108 (90 Base) MCG/ACT inhaler Inhale 2 puffs into the lungs every 6 (six) hours as needed for wheezing or shortness of breath. (Patient not taking: Reported on 06/19/2021) 8 g 5   amLODipine (NORVASC) 10 MG tablet Take 1 tablet (10 mg total) by mouth daily. 90 tablet 1   benzonatate (TESSALON) 100 MG capsule Take 1 capsule (100 mg total) by mouth 3 (three) times daily as needed for cough. (Patient not taking: Reported on 09/03/2022) 30 capsule 0   hydrochlorothiazide (HYDRODIURIL) 25 MG tablet Take 1 tablet (25 mg total) by mouth daily. 90 tablet 3   levothyroxine (SYNTHROID) 175 MCG tablet Take 1 tablet (175 mcg total) by mouth daily. 90 tablet 0   omeprazole (PRILOSEC) 20 MG capsule TAKE 1 CAPSULE BY MOUTH bid 180 capsule 3   potassium chloride SA (KLOR-CON M20) 20 MEQ tablet Take 1 tablet (20 mEq total) by mouth daily. 90 tablet 3   promethazine (PHENERGAN) 25 MG tablet TAKE 1 TABLET BY MOUTH EVERY 6 HOURS AS NEEDED FOR NAUSEA 30 tablet 1   promethazine-dextromethorphan (PROMETHAZINE-DM) 6.25-15 MG/5ML syrup Take 5 mLs by mouth 4 (four) times daily as needed. 118 mL 0   traMADol (ULTRAM) 50 MG tablet Take 1 tablet (50 mg total) by mouth every 8 (eight) hours as needed. 28 tablet 2   No current facility-administered medications on file prior to visit.   Allergies  Allergen Reactions   Diflucan [Fluconazole] Swelling    Blister  and swelling in mouth   Penicillins Rash   Family History  Problem Relation Age of Onset   Hypertension Mother    Thyroid disease Mother    Breast cancer Mother 48   Hypertension Father    Hypertension Sister    Colon cancer Maternal Aunt    Cancer Maternal Aunt        colon   Crohn's disease Neg Hx    Esophageal cancer Neg Hx    Rectal cancer Neg Hx    Stomach cancer Neg Hx    PE:  BP 118/72   Pulse 95   Ht 5\' 6"  (1.676 m)   Wt 193 lb 6.4 oz (87.7 kg)   SpO2 99%   BMI 31.22 kg/m   Wt Readings from Last 3 Encounters:  01/28/23 193 lb 6.4 oz (87.7 kg)  09/03/22 200 lb 12.8 oz (91.1 kg)  06/04/22 202 lb 6.4 oz (91.8 kg)   Constitutional: overweight, in NAD Eyes:  EOMI, no exophthalmos ENT: no neck masses, no cervical lymphadenopathy Cardiovascular: RRR, No MRG Respiratory: CTA B Musculoskeletal: no deformities Skin:no rashes Neurological: no tremor with outstretched hands  ASSESSMENT: 1. Hypothyroidism - poorly controlled - h/o noncompliance with LT4  2.  Prediabetes  3.  Obesity class I  PLAN:  1. Patient with low standing uncontrolled hypothyroidism, with history of noncompliance with levothyroxine - A TSH obtained by PCP in 09/2018 was very high, at 52.6.  At that time, she was on 150 mcg of levothyroxine daily.  The dose was increased to 200 mcg daily by PCP but subsequent TSH was suppressed so her dose was decreased to 137 mcg daily in 01/2019 and 125 mcg daily 05/2019.  After this, TSH was still elevated but she was taking the levothyroxine later in the morning and we moved it first thing in the morning.  However, TSH returned higher afterwards so we have been increasing the dose of levothyroxine gradually.  - latest thyroid labs reviewed with pt. >> TSH was elevated, but improved: Lab Results  Component Value Date   TSH 5.93 (H) 06/04/2022  - she continues on LT4 175 mcg daily.  At last visit, her TSH was 17.  I advised her to take LT4 every day, but did  not change the dose.  Subsequent TSH was normal, however, latest TSH was again slightly high (see above) - pt feels good on this dose. She lost 23 lbs since last OV. Minimum red meat. Stopped sweet tea. - we discussed about taking the thyroid hormone every day, with water, >30 minutes before breakfast, separated by >4 hours from acid reflux medications, calcium, iron, multivitamins. Pt. is taking it correctly. - will check thyroid tests today: TSH and fT4 - If labs are abnormal, she will need to return for repeat TFTs in 1.5 months -Will see her back in a year.  2.  Prediabetes -Latest HbA1c was reviewed from a year ago and this was higher, at 5.9%, in the prediabetic range -Will recheck this today -No increased thirst, urination, blurry vision -Discussed at previous visit about improving diet  3.  Obesity class I -She was previously on a modified plant-based diet, which included seafood and chicken.  However, she later reintroduced meat -She gained 2 pounds before last visit, previously gained 59  -she lost 23 pounds since last visit by adjusting diet  Orders Placed This Encounter  Procedures   TSH   T4, free   Hemoglobin A1c   Carlus Pavlov, MD PhD [facility] Endocr[facility]

## 2023-01-29 LAB — T4, FREE: Free T4: 1.3 ng/dL (ref 0.8–1.8)

## 2023-01-29 LAB — HEMOGLOBIN A1C
Hgb A1c MFr Bld: 5.7 %{Hb} — ABNORMAL HIGH (ref <5.7)
Mean Plasma Glucose: 117 mg/dL
eAG (mmol/L): 6.5 mmol/L

## 2023-01-29 LAB — TSH: TSH: 5.79 m[IU]/L — ABNORMAL HIGH

## 2023-02-01 ENCOUNTER — Other Ambulatory Visit: Payer: Self-pay | Admitting: Family Medicine

## 2023-02-01 DIAGNOSIS — I10 Essential (primary) hypertension: Secondary | ICD-10-CM

## 2023-02-11 LAB — HM PAP SMEAR: HM Pap smear: NEGATIVE

## 2023-02-12 ENCOUNTER — Encounter: Payer: Self-pay | Admitting: Family Medicine

## 2023-02-13 ENCOUNTER — Other Ambulatory Visit: Payer: Self-pay | Admitting: Family Medicine

## 2023-02-13 DIAGNOSIS — M25561 Pain in right knee: Secondary | ICD-10-CM

## 2023-02-13 MED ORDER — PROMETHAZINE HCL 25 MG PO TABS: 25.0000 mg | ORAL_TABLET | Freq: Four times a day (QID) | ORAL | 1 refills | Status: AC | PRN

## 2023-02-13 NOTE — Telephone Encounter (Signed)
Okay to refill? Last refill was 2021

## 2023-04-12 ENCOUNTER — Other Ambulatory Visit: Payer: Self-pay | Admitting: Internal Medicine

## 2023-04-12 DIAGNOSIS — E039 Hypothyroidism, unspecified: Secondary | ICD-10-CM

## 2023-06-06 ENCOUNTER — Other Ambulatory Visit: Payer: Self-pay | Admitting: Family Medicine

## 2023-06-06 DIAGNOSIS — I10 Essential (primary) hypertension: Secondary | ICD-10-CM

## 2023-07-12 ENCOUNTER — Other Ambulatory Visit: Payer: Self-pay | Admitting: Internal Medicine

## 2023-07-12 DIAGNOSIS — E039 Hypothyroidism, unspecified: Secondary | ICD-10-CM

## 2023-08-02 ENCOUNTER — Other Ambulatory Visit: Payer: Self-pay | Admitting: Family Medicine

## 2023-08-02 DIAGNOSIS — E876 Hypokalemia: Secondary | ICD-10-CM

## 2023-08-02 DIAGNOSIS — I10 Essential (primary) hypertension: Secondary | ICD-10-CM

## 2023-08-06 ENCOUNTER — Ambulatory Visit (INDEPENDENT_AMBULATORY_CARE_PROVIDER_SITE_OTHER): Payer: No Typology Code available for payment source | Admitting: Internal Medicine

## 2023-08-06 ENCOUNTER — Encounter: Payer: Self-pay | Admitting: Internal Medicine

## 2023-08-06 VITALS — BP 122/68 | HR 78 | Ht 66.0 in | Wt 195.6 lb

## 2023-08-06 DIAGNOSIS — E039 Hypothyroidism, unspecified: Secondary | ICD-10-CM

## 2023-08-06 DIAGNOSIS — R7303 Prediabetes: Secondary | ICD-10-CM | POA: Diagnosis not present

## 2023-08-06 DIAGNOSIS — E66811 Obesity, class 1: Secondary | ICD-10-CM | POA: Diagnosis not present

## 2023-08-06 NOTE — Patient Instructions (Signed)
 Please continue Levothyroxine 175 mcg daily.  Take the thyroid hormone every day, with water, at least 30 minutes before breakfast, separated by at least 4 hours from: - acid reflux medications - calcium - iron - multivitamins  Please stop at the lab.  Please come back for a follow-up appointment in 6 months.

## 2023-08-06 NOTE — Progress Notes (Signed)
 Patient ID: [name], female   DOB: [birth year], 53 y.o.   MRN: [id number]  HPI  [name] is a 53 y.o.-year-old female, returning for f/u for hypothyroidism and prediabetes. Last visit 6 months ago.  Interim hx: No weight gain or loss, constipation, increased urination, blurry vision, nausea, chest pain.  She does have slight fatigue, but this is only occasional.  Reviewed history: Pt. has been dx with hypothyroidism in ~2005 >> started on Levothyroxine  112 mcg for "years", then increased up to 200 mcg.  She has a history of noncompliance with her levothyroxine , now taking it every day.  She was then on 150 mcg daily of LT4 but the TSH was very high when checked by PCP last summer and the LT4 dose was increased to 200 mcg daily at that time, however, patient recalls that at that time she had day vomiting 2/2 lap band too tight >> had it stretched since.  A TSH returned suppressed in 01/2019, after which the dose of levothyroxine  was decreased.  However, we then need to increase the dose again as TSH increased.  Pt is on levothyroxine  175 mcg daily, taken: - no missed doses - first thing in am - fasting - no b'fast - at least 30 min from b'fast or has a brunch - no Ca, Fe, MVI, + PPIs more than 4 hours after levothyroxine  (lunchtime) - not on Biotin now  Reviewed her TFTs: Lab Results  Component Value Date   TSH 5.79 (H) 01/28/2023   TSH 5.93 (H) 06/04/2022   TSH 3.39 04/26/2022   TSH 17.09 (H) 12/19/2021   TSH 0.79 05/10/2021   TSH 3.38 10/31/2020   TSH 4.90 (H) 08/14/2020   TSH 11.46 (H) 06/22/2020   TSH 26.17 (H) 05/31/2020   TSH 20.09 (H) 04/04/2020   FREET4 1.3 01/28/2023   FREET4 0.86 04/26/2022   FREET4 0.69 12/19/2021   FREET4 0.83 10/31/2020   FREET4 1.04 08/14/2020   FREET4 0.91 06/22/2020   FREET4 0.77 05/31/2020   FREET4 0.80 04/04/2020   FREET4 1.09 07/02/2019   FREET4 1.33 05/13/2019   Reviewed the report of her thyroid  ultrasound: Thyroid  U/S  (02/20/2006): Heterogeneous thyroid , no nodules  Pt denies: - feeling nodules in neck - hoarseness - dysphagia - choking  She has + FH of thyroid  disorders in: mother (nodule). No FH of thyroid  cancer. No h/o radiation tx to head or neck. No herbal supplements. No Biotin use. No recent steroids use.   She has a history of lap band in 2009.  She lost approximately 100 pounds (from 300 pounds) initially.  She then gained some weight back but before our visit from 01/2023, she lost 23 pounds.  Reviewed HbA1c levels: Lab Results  Component Value Date   HGBA1C 5.7 (H) 01/28/2023   HGBA1C 5.9 12/19/2021   HGBA1C 5.6 06/04/2021   HGBA1C 5.3 10/31/2020   HGBA1C 5.4 10/29/2019   HGBA1C 5.7 03/11/2019   HGBA1C 5.7 03/10/2018   Lab Results  Component Value Date   BUN 14 06/04/2022   Lab Results  Component Value Date   CREATININE 0.79 06/04/2022  No results found for: "MICRALBCREAT"  + HL: Lab Results  Component Value Date   CHOL 266 (H) 06/04/2022   HDL 74.50 06/04/2022   LDLCALC 170 (H) 06/04/2022   LDLDIRECT 116.6 03/14/2011   TRIG 108.0 06/04/2022   CHOLHDL 4 06/04/2022   Last eye exam: 12/2021: No DR reportedly.  She stopped beef and pork in 07/2019 -  restarted eating beef since, but not a lot.  ROS:  + see HPI  I reviewed pt's medications, allergies, PMH, social hx, family hx, and changes were documented in the history of present illness. Otherwise, unchanged from my initial visit note.   Past Medical History:  Diagnosis Date   Allergy    SEASONAL,POLLEN,GRASS   Arthritis    KNEES,LEFT   Contact lens/glasses fitting    GERD (gastroesophageal reflux disease)    Hypertension    Thyroid  disease    Past Surgical History:  Procedure Laterality Date   ABDOMINAL HYSTERECTOMY  01/2010   Tah   BREAST BIOPSY Left 12/22/220   FIBROCYSTIC CHANGES INCLUDING APOCRINE METAPLASIA   CESAREAN SECTION  12/2003   LAPAROSCOPIC GASTRIC BANDING  10/12/2007   myomectomy      2x's. Patient unsure of date of procedure based on history form dated 11/10/2009.   Social History   Social History   Marital Status: single    Spouse Name: N/A   Number of Children: 1   Occupational History   Customer svc supervisor   Social History Main Topics   Smoking status: Never Smoker    Smokeless tobacco: Never Used   Alcohol Use: No   Drug Use: No   Sexual Activity:    Partners: Male   Social History Narrative   Exercising---treadmill 4x a week   Current Outpatient Medications on File Prior to Visit  Medication Sig Dispense Refill   albuterol  (VENTOLIN  HFA) 108 (90 Base) MCG/ACT inhaler Inhale 2 puffs into the lungs every 6 (six) hours as needed for wheezing or shortness of breath. (Patient not taking: Reported on 01/28/2023) 8 g 5   amLODipine  (NORVASC ) 10 MG tablet Take 1 tablet (10 mg total) by mouth daily. 30 tablet 0   benzonatate  (TESSALON ) 100 MG capsule Take 1 capsule (100 mg total) by mouth 3 (three) times daily as needed for cough. (Patient not taking: Reported on 01/28/2023) 30 capsule 0   hydrochlorothiazide  (HYDRODIURIL ) 25 MG tablet Take 1 tablet (25 mg total) by mouth daily. 90 tablet 0   levothyroxine  (SYNTHROID ) 175 MCG tablet TAKE 1 TABLET BY MOUTH EVERY DAY 90 tablet 0   omeprazole  (PRILOSEC) 20 MG capsule TAKE 1 CAPSULE BY MOUTH bid 180 capsule 3   potassium chloride  SA (KLOR-CON  M) 20 MEQ tablet Take 1 tablet (20 mEq total) by mouth daily. 30 tablet 0   promethazine  (PHENERGAN ) 25 MG tablet Take 1 tablet (25 mg total) by mouth every 6 (six) hours as needed. for nausea 30 tablet 1   promethazine -dextromethorphan (PROMETHAZINE -DM) 6.25-15 MG/5ML syrup Take 5 mLs by mouth 4 (four) times daily as needed. (Patient not taking: Reported on 01/28/2023) 118 mL 0   traMADol  (ULTRAM ) 50 MG tablet Take 1 tablet (50 mg total) by mouth every 8 (eight) hours as needed. 28 tablet 2   No current facility-administered medications on file prior to visit.   Allergies   Allergen Reactions   Diflucan  [Fluconazole ] Swelling    Blister and swelling in mouth   Penicillins Rash   Family History  Problem Relation Age of Onset   Hypertension Mother    Thyroid  disease Mother    Breast cancer Mother 70   Hypertension Father    Hypertension Sister    Colon cancer Maternal Aunt    Cancer Maternal Aunt        colon   Crohn's disease Neg Hx    Esophageal cancer Neg Hx    Rectal cancer Neg Hx  Stomach cancer Neg Hx    PE:  BP 122/68   Pulse 78   Ht 5\' 6"  (1.676 m)   Wt 195 lb 9.6 oz (88.7 kg)   SpO2 98%   BMI 31.57 kg/m   Wt Readings from Last 3 Encounters:  08/06/23 195 lb 9.6 oz (88.7 kg)  01/28/23 193 lb 6.4 oz (87.7 kg)  09/03/22 200 lb 12.8 oz (91.1 kg)   Constitutional: overweight, in NAD Eyes:  EOMI, no exophthalmos ENT: no neck masses, no cervical lymphadenopathy Cardiovascular: RRR, No MRG Respiratory: CTA B Musculoskeletal: no deformities Skin:no rashes Neurological: no tremor with outstretched hands  ASSESSMENT: 1. Hypothyroidism - poorly controlled - h/o noncompliance with LT4  2.  Prediabetes  3.  Obesity class I  PLAN:  1. Patient with longstanding uncontrolled hypothyroidism, with history of noncompliance with levothyroxine . - A TSH obtained by PCP in 09/2018 was very high, at 52.6.  At that time, she was on 150 mcg of levothyroxine  daily.  The dose was increased to 200 mcg daily by PCP but subsequent TSH was suppressed so her dose was decreased to 137 mcg daily in 01/2019 and 125 mcg daily 05/2019.  After this, TSH was still elevated but she was taking the levothyroxine  later in the morning and we moved it first thing in the morning.  However, TSH returned higher afterwards so we have been increasing the dose of levothyroxine  gradually.  At last visit TSH was slightly high, but we did not change the LT4 dose as she was actively losing weight: Lab Results  Component Value Date   TSH 5.79 (H) 01/28/2023  - she continues  on LT4 175 mcg daily - pt feels good on this dose. - we discussed about taking the thyroid  hormone every day, with water, >30 minutes before breakfast, separated by >4 hours from acid reflux medications, calcium, iron, multivitamins. Pt. is taking it correctly. - will check thyroid  tests today: TSH and fT4 - If labs are abnormal, she will need to return for repeat TFTs in 1.5 months - OTW, I will see her back in 6 months  2.  Prediabetes - Reviewed latest HbA1c and this was improved from 5.9% to 5.7% 6 months ago. - Will recheck this at next visit - She denies increased thirst, urination, blurry vision - We previously discussed about improving diet  3.  Obesity class I -She was previously on a modified plant-based diet, which included seafood and chicken.  However, she later reintroduced meat - She lost 23 pounds before last visit by adjusting diet.  She stopped sweet tea and was eating minimal red meat at last visit.  Since last visit weight is approximately stable.  Needs refills.  Orders Placed This Encounter  Procedures   TSH   T4, free   Emilie Harden, MD PhD [facility] Endocrin[facility]

## 2023-08-07 ENCOUNTER — Ambulatory Visit: Payer: Self-pay | Admitting: Internal Medicine

## 2023-08-07 DIAGNOSIS — E039 Hypothyroidism, unspecified: Secondary | ICD-10-CM

## 2023-08-07 LAB — TSH: TSH: 7.89 m[IU]/L — ABNORMAL HIGH

## 2023-08-07 LAB — T4, FREE: Free T4: 1.2 ng/dL (ref 0.8–1.8)

## 2023-08-07 MED ORDER — LEVOTHYROXINE SODIUM 200 MCG PO TABS: 200.0000 ug | ORAL_TABLET | ORAL | 3 refills | Status: DC

## 2023-08-07 MED ORDER — LEVOTHYROXINE SODIUM 175 MCG PO TABS
175.0000 ug | ORAL_TABLET | ORAL | 3 refills | Status: DC
Start: 2023-08-07 — End: 2023-10-21

## 2023-08-07 NOTE — Addendum Note (Signed)
 Addended by: Emilie Harden on: 08/07/2023 02:22 PM   Modules accepted: Orders

## 2023-09-02 ENCOUNTER — Other Ambulatory Visit: Payer: Self-pay | Admitting: Family Medicine

## 2023-09-02 DIAGNOSIS — I10 Essential (primary) hypertension: Secondary | ICD-10-CM

## 2023-09-02 DIAGNOSIS — E876 Hypokalemia: Secondary | ICD-10-CM

## 2023-09-05 ENCOUNTER — Other Ambulatory Visit: Payer: Self-pay | Admitting: Family Medicine

## 2023-09-05 DIAGNOSIS — I10 Essential (primary) hypertension: Secondary | ICD-10-CM

## 2023-09-05 DIAGNOSIS — K219 Gastro-esophageal reflux disease without esophagitis: Secondary | ICD-10-CM

## 2023-09-16 ENCOUNTER — Other Ambulatory Visit

## 2023-09-16 LAB — T4, FREE: Free T4: 2 ng/dL — ABNORMAL HIGH (ref 0.8–1.8)

## 2023-09-16 LAB — TSH: TSH: 0.3 m[IU]/L — ABNORMAL LOW

## 2023-10-03 ENCOUNTER — Other Ambulatory Visit: Payer: Self-pay | Admitting: Family Medicine

## 2023-10-03 DIAGNOSIS — I10 Essential (primary) hypertension: Secondary | ICD-10-CM

## 2023-10-03 DIAGNOSIS — E876 Hypokalemia: Secondary | ICD-10-CM

## 2023-10-03 MED ORDER — POTASSIUM CHLORIDE CRYS ER 20 MEQ PO TBCR
20.0000 meq | EXTENDED_RELEASE_TABLET | Freq: Every day | ORAL | 0 refills | Status: DC
Start: 2023-10-03 — End: 2023-11-04

## 2023-10-03 MED ORDER — AMLODIPINE BESYLATE 10 MG PO TABS: 10.0000 mg | ORAL_TABLET | Freq: Every day | ORAL | 0 refills | Status: DC

## 2023-10-03 NOTE — Telephone Encounter (Signed)
 Copied from CRM [id number]. Topic: Clinical - Medication Refill >> Oct 03, 2023 11:42 AM Rea C wrote: Medication: amLODipine  (NORVASC ) 10 MG tablet; potassium chloride  SA (KLOR-CON  M) 20 MEQ tablet  Has the patient contacted their pharmacy? Yes and the prescriptions were refused because patient needed an appointment.  (Agent: If no, request that the patient contact the pharmacy for the refill. If patient does not wish to contact the pharmacy document the reason why and proceed with request.) (Agent: If yes, when and what did the pharmacy advise?)  This is the patient's preferred pharmacy:  [facility] [phone] - [place], [place] - [address] [place] [address] Phone: [phone] Fax: [phone]  Is this the correct pharmacy for this prescription? Yes If no, delete pharmacy and type the correct one.   Has the prescription been filled recently? no  Is the patient out of the medication? Yes and is scheduled for an appointment about two weeks out. Patient will be without BP meds for two weeks.   Has the patient been seen for an appointment in the last year OR does the patient have an upcoming appointment? Oct 20 2023 09:40 AM - Physical Visit [facility] - Scott R Antonio Meth, DO   Can we respond through MyChart? Yes   Agent: Please be advised that Rx refills may take up to 3 business days. We ask that you follow-up with your pharmacy.

## 2023-10-04 ENCOUNTER — Other Ambulatory Visit: Payer: Self-pay | Admitting: Internal Medicine

## 2023-10-10 ENCOUNTER — Other Ambulatory Visit: Payer: Self-pay | Admitting: Family Medicine

## 2023-10-10 DIAGNOSIS — K219 Gastro-esophageal reflux disease without esophagitis: Secondary | ICD-10-CM

## 2023-10-10 DIAGNOSIS — I10 Essential (primary) hypertension: Secondary | ICD-10-CM

## 2023-10-11 ENCOUNTER — Other Ambulatory Visit: Payer: Self-pay | Admitting: Family Medicine

## 2023-10-11 DIAGNOSIS — I10 Essential (primary) hypertension: Secondary | ICD-10-CM

## 2023-10-20 ENCOUNTER — Other Ambulatory Visit: Payer: Self-pay | Admitting: Obstetrics and Gynecology

## 2023-10-20 ENCOUNTER — Ambulatory Visit (INDEPENDENT_AMBULATORY_CARE_PROVIDER_SITE_OTHER): Admitting: Family Medicine

## 2023-10-20 ENCOUNTER — Encounter: Payer: Self-pay | Admitting: Family Medicine

## 2023-10-20 VITALS — BP 118/82 | HR 82 | Temp 98.3°F | Resp 16 | Ht 66.0 in | Wt 194.2 lb

## 2023-10-20 DIAGNOSIS — E785 Hyperlipidemia, unspecified: Secondary | ICD-10-CM

## 2023-10-20 DIAGNOSIS — E039 Hypothyroidism, unspecified: Secondary | ICD-10-CM

## 2023-10-20 DIAGNOSIS — K219 Gastro-esophageal reflux disease without esophagitis: Secondary | ICD-10-CM | POA: Diagnosis not present

## 2023-10-20 DIAGNOSIS — E876 Hypokalemia: Secondary | ICD-10-CM

## 2023-10-20 DIAGNOSIS — Z0000 Encounter for general adult medical examination without abnormal findings: Secondary | ICD-10-CM

## 2023-10-20 DIAGNOSIS — Z9229 Personal history of other drug therapy: Secondary | ICD-10-CM

## 2023-10-20 DIAGNOSIS — I10 Essential (primary) hypertension: Secondary | ICD-10-CM

## 2023-10-20 DIAGNOSIS — Z1231 Encounter for screening mammogram for malignant neoplasm of breast: Secondary | ICD-10-CM

## 2023-10-20 LAB — COMPREHENSIVE METABOLIC PANEL WITH GFR
ALT: 10 U/L (ref 0–35)
AST: 14 U/L (ref 0–37)
Albumin: 4.2 g/dL (ref 3.5–5.2)
Alkaline Phosphatase: 122 U/L — ABNORMAL HIGH (ref 39–117)
BUN: 15 mg/dL (ref 6–23)
CO2: 31 meq/L (ref 19–32)
Calcium: 9.9 mg/dL (ref 8.4–10.5)
Chloride: 102 meq/L (ref 96–112)
Creatinine, Ser: 0.73 mg/dL (ref 0.40–1.20)
GFR: 94.86 mL/min (ref >60.00)
Glucose, Bld: 89 mg/dL (ref 70–99)
Potassium: 4 meq/L (ref 3.5–5.1)
Sodium: 142 meq/L (ref 135–145)
Total Bilirubin: 0.4 mg/dL (ref 0.2–1.2)
Total Protein: 7.2 g/dL (ref 6.0–8.3)

## 2023-10-20 LAB — LIPID PANEL
Cholesterol: 233 mg/dL — ABNORMAL HIGH (ref 0–200)
HDL: 70.1 mg/dL (ref >39.00)
LDL Cholesterol: 142 mg/dL — ABNORMAL HIGH (ref 0–99)
NonHDL: 163.39
Total CHOL/HDL Ratio: 3
Triglycerides: 106 mg/dL (ref 0.0–149.0)
VLDL: 21.2 mg/dL (ref 0.0–40.0)

## 2023-10-20 LAB — CBC WITH DIFFERENTIAL/PLATELET
Basophils Absolute: 0 K/uL (ref 0.0–0.1)
Basophils Relative: 0.5 % (ref 0.0–3.0)
Eosinophils Absolute: 0.1 K/uL (ref 0.0–0.7)
Eosinophils Relative: 3.6 % (ref 0.0–5.0)
HCT: 36.8 % (ref 36.0–46.0)
Hemoglobin: 11.6 g/dL — ABNORMAL LOW (ref 12.0–15.0)
Lymphocytes Relative: 42.6 % (ref 12.0–46.0)
Lymphs Abs: 1.6 K/uL (ref 0.7–4.0)
MCHC: 31.5 g/dL (ref 30.0–36.0)
MCV: 83.1 fl (ref 78.0–100.0)
Monocytes Absolute: 0.3 K/uL (ref 0.1–1.0)
Monocytes Relative: 8.2 % (ref 3.0–12.0)
Neutro Abs: 1.7 K/uL (ref 1.4–7.7)
Neutrophils Relative %: 45.1 % (ref 43.0–77.0)
Platelets: 313 K/uL (ref 150.0–400.0)
RBC: 4.43 Mil/uL (ref 3.87–5.11)
RDW: 15.3 % (ref 11.5–15.5)
WBC: 3.8 K/uL — ABNORMAL LOW (ref 4.0–10.5)

## 2023-10-20 LAB — TSH: TSH: 0.11 u[IU]/mL — ABNORMAL LOW (ref 0.35–5.50)

## 2023-10-20 NOTE — Progress Notes (Signed)
 Subjective:    Patient ID: [name], female    DOB: 1971/02/25, 53 y.o.   MRN: [id number]  Chief Complaint  Patient presents with   Annual Exam    Pt states fasting     HPI Patient is in today for cpe Discussed the use of AI scribe software for clinical note transcription with the patient, who gave verbal consent to proceed.  History of Present Illness [name] is a 53 year old female who presents for an annual physical exam.  She has no complaints and feels well overall. No issues with her stomach, joints, or skin, and no concerns about moles.  She has had a mammogram at the [facility], which she believes was done in April or May of this year, although the records show the last one was in July 2024.  She inquires about the shingles vaccine, noting she is at the age where it is recommended. She confirms she has received the hepatitis B vaccine in the past.  She exercises two to three times a week and regularly visits the eye doctor and dentist. 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 She describes her job as not stressful and does not plan to retire soon.  No changes in her family history.   Past Medical History:  Diagnosis Date   Allergy    SEASONAL,POLLEN,GRASS   Arthritis    KNEES,LEFT   Contact lens/glasses fitting    GERD (gastroesophageal reflux disease)    Hypertension    Thyroid  disease     Past Surgical History:  Procedure Laterality Date   ABDOMINAL HYSTERECTOMY  01/2010   [facility]   BREAST BIOPSY Left 12/22/220   FIBROCYSTIC CHANGES INCLUDING APOCRINE METAPLASIA   CESAREAN SECTION  12/2003   LAPAROSCOPIC GASTRIC BANDING  10/12/2007   myomectomy     2x's. Patient unsure of date of procedure based on history form dated 11/10/2009.    Family History  Problem Relation Age of Onset   Hypertension Mother    Thyroid  disease Mother    Breast cancer Mother 93   Hypertension Father    Hypertension  Sister    Colon cancer Maternal Aunt    Cancer Maternal Aunt        colon   Crohn's disease Neg Hx    Esophageal cancer Neg Hx    Rectal cancer Neg Hx    Stomach cancer Neg Hx     Social History   Socioeconomic History   Marital status: Divorced    Spouse name: Not on file   Number of children: Not on file   Years of education: Not on file   Highest education level: Not on file  Occupational History   Occupation: [employer]  Tobacco Use   Smoking status: Never    Passive exposure: Never   Smokeless tobacco: Never  Vaping Use   Vaping status: Never Used  Substance and Sexual Activity   Alcohol use: No   Drug use: No   Sexual activity: Not on file  Other Topics Concern   Not on file  Social History Narrative   Exercising---treadmill 3-4 a week   Social Drivers of Corporate investment banker Strain: Not on file  Food Insecurity: Not on file  Transportation Needs: Not on file  Physical Activity: Not on file  Stress: Not on file  Social Connections: Not on file  Intimate Partner Violence: Not on file  Outpatient Medications Prior to Visit  Medication Sig Dispense Refill   albuterol  (VENTOLIN  HFA) 108 (90 Base) MCG/ACT inhaler Inhale 2 puffs into the lungs every 6 (six) hours as needed for wheezing or shortness of breath. 8 g 5   amLODipine  (NORVASC ) 10 MG tablet Take 1 tablet (10 mg total) by mouth daily. Needs appt 30 tablet 0   benzonatate  (TESSALON ) 100 MG capsule Take 1 capsule (100 mg total) by mouth 3 (three) times daily as needed for cough. 30 capsule 0   hydrochlorothiazide  (HYDRODIURIL ) 25 MG tablet TAKE 1 TABLET (25 MG TOTAL) BY MOUTH DAILY. PT NEEDS OFFICE VISIT FOR FURTHER REFILLS 14 tablet 0   levothyroxine  (SYNTHROID ) 175 MCG tablet Take 1 tablet (175 mcg total) by mouth every other day. 30 tablet 3   levothyroxine  (SYNTHROID ) 200 MCG tablet TAKE 1 TABLET (200 MCG TOTAL) BY MOUTH EVERY OTHER DAY. 45 tablet 2   omeprazole  (PRILOSEC) 20 MG capsule TAKE 1  CAPSULE (20 MG TOTAL) BY MOUTH 2 (TWO) TIMES DAILY. PT NEEDS OFFICE VISIT FOR FURTHER REFILLS 14 capsule 0   potassium chloride  SA (KLOR-CON  M) 20 MEQ tablet Take 1 tablet (20 mEq total) by mouth daily. Needs appt 30 tablet 0   promethazine  (PHENERGAN ) 25 MG tablet Take 1 tablet (25 mg total) by mouth every 6 (six) hours as needed. for nausea 30 tablet 1   promethazine -dextromethorphan (PROMETHAZINE -DM) 6.25-15 MG/5ML syrup Take 5 mLs by mouth 4 (four) times daily as needed. 118 mL 0   traMADol  (ULTRAM ) 50 MG tablet Take 1 tablet (50 mg total) by mouth every 8 (eight) hours as needed. 28 tablet 2   No facility-administered medications prior to visit.    Allergies  Allergen Reactions   Diflucan  [Fluconazole ] Swelling    Blister and swelling in mouth   Penicillins Rash    Review of Systems  Constitutional:  Negative for chills, fever and malaise/fatigue.  HENT:  Negative for congestion and hearing loss.   Eyes:  Negative for discharge.  Respiratory:  Negative for cough, sputum production and shortness of breath.   Cardiovascular:  Negative for chest pain, palpitations and leg swelling.  Gastrointestinal:  Negative for abdominal pain, blood in stool, constipation, diarrhea, heartburn, nausea and vomiting.  Genitourinary:  Negative for dysuria, frequency, hematuria and urgency.  Musculoskeletal:  Negative for back pain, falls and myalgias.  Skin:  Negative for rash.  Neurological:  Negative for dizziness, sensory change, loss of consciousness, weakness and headaches.  Endo/Heme/Allergies:  Negative for environmental allergies. Does not bruise/bleed easily.  Psychiatric/Behavioral:  Negative for depression and suicidal ideas. The patient is not nervous/anxious and does not have insomnia.        Objective:    Physical Exam Vitals and nursing note reviewed.  Constitutional:      General: She is not in acute distress.    Appearance: Normal appearance. She is well-developed.  HENT:      Head: Normocephalic and atraumatic.     Right Ear: Tympanic membrane, ear canal and external ear normal. There is no impacted cerumen.     Left Ear: Tympanic membrane, ear canal and external ear normal. There is no impacted cerumen.     Nose: Nose normal.     Mouth/Throat:     Mouth: Mucous membranes are moist.     Pharynx: Oropharynx is clear. No oropharyngeal exudate or posterior oropharyngeal erythema.  Eyes:     General: No scleral icterus.       Right eye: No discharge.  Left eye: No discharge.     Conjunctiva/sclera: Conjunctivae normal.     Pupils: Pupils are equal, round, and reactive to light.  Neck:     Thyroid : No thyromegaly or thyroid  tenderness.     Vascular: No JVD.  Cardiovascular:     Rate and Rhythm: Normal rate and regular rhythm.     Heart sounds: Normal heart sounds. No murmur heard. Pulmonary:     Effort: Pulmonary effort is normal. No respiratory distress.     Breath sounds: Normal breath sounds.  Abdominal:     General: Bowel sounds are normal. There is no distension.     Palpations: Abdomen is soft. There is no mass.     Tenderness: There is no abdominal tenderness. There is no guarding or rebound.  Musculoskeletal:        General: Normal range of motion.     Cervical back: Normal range of motion and neck supple.     Right lower leg: No edema.     Left lower leg: No edema.  Lymphadenopathy:     Cervical: No cervical adenopathy.  Skin:    General: Skin is warm and dry.     Findings: No erythema or rash.  Neurological:     Mental Status: She is alert and oriented to person, place, and time.     Cranial Nerves: No cranial nerve deficit.     Deep Tendon Reflexes: Reflexes are normal and symmetric.  Psychiatric:        Mood and Affect: Mood normal.        Behavior: Behavior normal.        Thought Content: Thought content normal.        Judgment: Judgment normal.     BP 118/82 (BP Location: Left Arm, Patient Position: Sitting, Cuff Size: Large)    Pulse 82   Temp 98.3 F (36.8 C) (Oral)   Resp 16   Ht 5' 6 (1.676 m)   Wt 194 lb 3.2 oz (88.1 kg)   SpO2 99%   BMI 31.34 kg/m  Wt Readings from Last 3 Encounters:  10/20/23 194 lb 3.2 oz (88.1 kg)  08/06/23 195 lb 9.6 oz (88.7 kg)  01/28/23 193 lb 6.4 oz (87.7 kg)    Diabetic Foot Exam - Simple   No data filed    Lab Results  Component Value Date   WBC 3.8 (L) 10/20/2023   HGB 11.6 (L) 10/20/2023   HCT 36.8 10/20/2023   PLT 313.0 10/20/2023   GLUCOSE 89 10/20/2023   CHOL 233 (H) 10/20/2023   TRIG 106.0 10/20/2023   HDL 70.10 10/20/2023   LDLDIRECT 116.6 03/14/2011   LDLCALC 142 (H) 10/20/2023   ALT 10 10/20/2023   AST 14 10/20/2023   NA 142 10/20/2023   K 4.0 10/20/2023   CL 102 10/20/2023   CREATININE 0.73 10/20/2023   BUN 15 10/20/2023   CO2 31 10/20/2023   TSH 0.11 (L) 10/20/2023   HGBA1C 5.7 (H) 01/28/2023    Lab Results  Component Value Date   TSH 0.11 (L) 10/20/2023   Lab Results  Component Value Date   WBC 3.8 (L) 10/20/2023   HGB 11.6 (L) 10/20/2023   HCT 36.8 10/20/2023   MCV 83.1 10/20/2023   PLT 313.0 10/20/2023   Lab Results  Component Value Date   NA 142 10/20/2023   K 4.0 10/20/2023   CO2 31 10/20/2023   GLUCOSE 89 10/20/2023   BUN 15 10/20/2023   CREATININE 0.73 10/20/2023  BILITOT 0.4 10/20/2023   ALKPHOS 122 (H) 10/20/2023   AST 14 10/20/2023   ALT 10 10/20/2023   PROT 7.2 10/20/2023   ALBUMIN 4.2 10/20/2023   CALCIUM 9.9 10/20/2023   GFR 94.86 10/20/2023   Lab Results  Component Value Date   CHOL 233 (H) 10/20/2023   Lab Results  Component Value Date   HDL 70.10 10/20/2023   Lab Results  Component Value Date   LDLCALC 142 (H) 10/20/2023   Lab Results  Component Value Date   TRIG 106.0 10/20/2023   Lab Results  Component Value Date   CHOLHDL 3 10/20/2023   Lab Results  Component Value Date   HGBA1C 5.7 (H) 01/28/2023       Assessment & Plan:  Preventative health care Assessment & Plan: Ghm  utd Check labs  See AVS  Health Maintenance  Topic Date Due   HIV Screening  Never done   Hepatitis B Vaccines 19-59 Average Risk (1 of 3 - 19+ 3-dose series) Never done   Pneumococcal Vaccine: 50+ Years (1 of 1 - PCV) Never done   Zoster Vaccines- Shingrix (1 of 2) Never done   MAMMOGRAM  09/16/2023   INFLUENZA VACCINE  10/03/2023   COVID-19 Vaccine (4 - 2024-25 season) 11/05/2023 (Originally 11/03/2022)   Cervical Cancer Screening (HPV/Pap Cotest)  02/10/2026   Colonoscopy  07/04/2031   DTaP/Tdap/Td (8 - Td or Tdap) 06/03/2032   Hepatitis C Screening  Completed   HPV VACCINES  Aged Out   Meningococcal B Vaccine  Aged Out     Orders: -     CBC with Differential/Platelet -     Comprehensive metabolic panel with GFR -     Lipid panel -     TSH  Gastroesophageal reflux disease, unspecified whether esophagitis present  Essential hypertension -     CBC with Differential/Platelet -     Comprehensive metabolic panel with GFR -     Lipid panel  Hypokalemia -     Comprehensive metabolic panel with GFR  Hypothyroidism, unspecified type -     TSH  Hyperlipidemia, unspecified hyperlipidemia type -     Comprehensive metabolic panel with GFR -     Lipid panel  Up to date with hepatitis B immunization -     Hepatitis B surface antibody,quantitative   Assessment and Plan   Assessment & Plan Adult Wellness Visit   Routine adult wellness visit with no complaints or concerns. She exercises regularly, 2-3 times a week, and maintains regular visits to her ophthalmologist and dentist. There are no changes in her family history and no gastrointestinal, joint, or dermatological issues. Order labs for cholesterol, thyroid , liver, kidney, and glucose. Follow up on mammogram status and schedule if not done this year. Discussed the shingles vaccine, recommending it due to increased risk of myocardial infarction and cerebrovascular accident if shingles is contracted. She opted to defer  vaccination today. The vaccine is available at this office, the downstairs pharmacy, or her preferred pharmacy.   Jadesola Poynter R Lowne Chase, DO

## 2023-10-20 NOTE — Patient Instructions (Signed)
 Preventive Care 58-52 Years Old, Female  Preventive care refers to lifestyle choices and visits with your health care provider that can promote health and wellness. Preventive care visits are also called wellness exams.  What can I expect for my preventive care visit?  Counseling  Your health care provider may ask you questions about your:  Medical history, including:  Past medical problems.  Family medical history.  Pregnancy history.  Current health, including:  Menstrual cycle.  Method of birth control.  Emotional well-being.  Home life and relationship well-being.  Sexual activity and sexual health.  Lifestyle, including:  Alcohol, nicotine or tobacco, and drug use.  Access to firearms.  Diet, exercise, and sleep habits.  Work and work Astronomer.  Sunscreen use.  Safety issues such as seatbelt and bike helmet use.  Physical exam  Your health care provider will check your:  Height and weight. These may be used to calculate your BMI (body mass index). BMI is a measurement that tells if you are at a healthy weight.  Waist circumference. This measures the distance around your waistline. This measurement also tells if you are at a healthy weight and may help predict your risk of certain diseases, such as type 2 diabetes and high blood pressure.  Heart rate and blood pressure.  Body temperature.  Skin for abnormal spots.  What immunizations do I need?    Vaccines are usually given at various ages, according to a schedule. Your health care provider will recommend vaccines for you based on your age, medical history, and lifestyle or other factors, such as travel or where you work.  What tests do I need?  Screening  Your health care provider may recommend screening tests for certain conditions. This may include:  Lipid and cholesterol levels.  Diabetes screening. This is done by checking your blood sugar (glucose) after you have not eaten for a while (fasting).  Pelvic exam and Pap test.  Hepatitis B test.  Hepatitis C  test.  HIV (human immunodeficiency virus) test.  STI (sexually transmitted infection) testing, if you are at risk.  Lung cancer screening.  Colorectal cancer screening.  Mammogram. Talk with your health care provider about when you should start having regular mammograms. This may depend on whether you have a family history of breast cancer.  BRCA-related cancer screening. This may be done if you have a family history of breast, ovarian, tubal, or peritoneal cancers.  Bone density scan. This is done to screen for osteoporosis.  Talk with your health care provider about your test results, treatment options, and if necessary, the need for more tests.  Follow these instructions at home:  Eating and drinking    Eat a diet that includes fresh fruits and vegetables, whole grains, lean protein, and low-fat dairy products.  Take vitamin and mineral supplements as recommended by your health care provider.  Do not drink alcohol if:  Your health care provider tells you not to drink.  You are pregnant, may be pregnant, or are planning to become pregnant.  If you drink alcohol:  Limit how much you have to 0-1 drink a day.  Know how much alcohol is in your drink. In the [place] one drink equals one 12 oz bottle of beer (355 mL), one 5 oz glass of wine (148 mL), or one 1 oz glass of hard liquor (44 mL).  Lifestyle  Brush your teeth every morning and night with fluoride toothpaste. Floss one time each day.  Exercise for at least  30 minutes 5 or more days each week.  Do not use any products that contain nicotine or tobacco. These products include cigarettes, chewing tobacco, and vaping devices, such as e-cigarettes. If you need help quitting, ask your health care provider.  Do not use drugs.  If you are sexually active, practice safe sex. Use a condom or other form of protection to prevent STIs.  If you do not wish to become pregnant, use a form of birth control. If you plan to become pregnant, see your health care provider for a  prepregnancy visit.  Take aspirin only as told by your health care provider. Make sure that you understand how much to take and what form to take. Work with your health care provider to find out whether it is safe and beneficial for you to take aspirin daily.  Find healthy ways to manage stress, such as:  Meditation, yoga, or listening to music.  Journaling.  Talking to a trusted person.  Spending time with friends and family.  Minimize exposure to UV radiation to reduce your risk of skin cancer.  Safety  Always wear your seat belt while driving or riding in a vehicle.  Do not drive:  If you have been drinking alcohol. Do not ride with someone who has been drinking.  When you are tired or distracted.  While texting.  If you have been using any mind-altering substances or drugs.  Wear a helmet and other protective equipment during sports activities.  If you have firearms in your house, make sure you follow all gun safety procedures.  Seek help if you have been physically or sexually abused.  What's next?  Visit your health care provider once a year for an annual wellness visit.  Ask your health care provider how often you should have your eyes and teeth checked.  Stay up to date on all vaccines.  This information is not intended to replace advice given to you by your health care provider. Make sure you discuss any questions you have with your health care provider.  Document Revised: 08/16/2020 Document Reviewed: 08/16/2020  Elsevier Patient Education  2024 [employer].

## 2023-10-20 NOTE — Assessment & Plan Note (Signed)
 Ghm utd Check labs  See AVS  Health Maintenance  Topic Date Due   HIV Screening  Never done   Hepatitis B Vaccines 19-59 Average Risk (1 of 3 - 19+ 3-dose series) Never done   Pneumococcal Vaccine: 50+ Years (1 of 1 - PCV) Never done   Zoster Vaccines- Shingrix (1 of 2) Never done   MAMMOGRAM  09/16/2023   INFLUENZA VACCINE  10/03/2023   COVID-19 Vaccine (4 - 2024-25 season) 11/05/2023 (Originally 11/03/2022)   Cervical Cancer Screening (HPV/Pap Cotest)  02/10/2026   Colonoscopy  07/04/2031   DTaP/Tdap/Td (8 - Td or Tdap) 06/03/2032   Hepatitis C Screening  Completed   HPV VACCINES  Aged Out   Meningococcal B Vaccine  Aged Out

## 2023-10-21 ENCOUNTER — Other Ambulatory Visit: Payer: Self-pay | Admitting: Internal Medicine

## 2023-10-21 ENCOUNTER — Encounter: Payer: Self-pay | Admitting: Internal Medicine

## 2023-10-21 DIAGNOSIS — E039 Hypothyroidism, unspecified: Secondary | ICD-10-CM

## 2023-10-21 LAB — HEPATITIS B SURFACE ANTIBODY, QUANTITATIVE: Hep B S AB Quant (Post): <5 m[IU]/mL — ABNORMAL LOW (ref >=10)

## 2023-10-21 MED ORDER — LEVOTHYROXINE SODIUM 175 MCG PO TABS: 175.0000 ug | ORAL_TABLET | Freq: Every day | ORAL | Status: DC

## 2023-10-29 ENCOUNTER — Other Ambulatory Visit

## 2023-11-02 ENCOUNTER — Ambulatory Visit: Payer: Self-pay | Admitting: Family Medicine

## 2023-11-03 ENCOUNTER — Other Ambulatory Visit: Payer: Self-pay | Admitting: Family Medicine

## 2023-11-03 DIAGNOSIS — I10 Essential (primary) hypertension: Secondary | ICD-10-CM

## 2023-11-03 DIAGNOSIS — E876 Hypokalemia: Secondary | ICD-10-CM

## 2023-11-28 ENCOUNTER — Other Ambulatory Visit

## 2023-11-28 LAB — TSH: TSH: 0.59 m[IU]/L

## 2023-11-28 LAB — T4, FREE: Free T4: 1.4 ng/dL (ref 0.8–1.8)

## 2023-12-01 ENCOUNTER — Ambulatory Visit
Admission: RE | Admit: 2023-12-01 | Discharge: 2023-12-01 | Disposition: A | Source: Ambulatory Visit | Attending: Obstetrics and Gynecology | Admitting: Obstetrics and Gynecology

## 2023-12-01 DIAGNOSIS — Z1231 Encounter for screening mammogram for malignant neoplasm of breast: Secondary | ICD-10-CM

## 2024-02-03 ENCOUNTER — Other Ambulatory Visit: Payer: Self-pay | Admitting: Family Medicine

## 2024-02-03 DIAGNOSIS — I10 Essential (primary) hypertension: Secondary | ICD-10-CM

## 2024-02-05 ENCOUNTER — Ambulatory Visit: Admitting: Internal Medicine

## 2024-03-09 DIAGNOSIS — E039 Hypothyroidism, unspecified: Secondary | ICD-10-CM

## 2024-03-09 MED ORDER — LEVOTHYROXINE SODIUM 175 MCG PO TABS: 175.0000 ug | ORAL_TABLET | Freq: Every day | ORAL | 2 refills | Status: DC

## 2024-03-27 ENCOUNTER — Other Ambulatory Visit: Payer: Self-pay | Admitting: Family Medicine

## 2024-03-27 DIAGNOSIS — K219 Gastro-esophageal reflux disease without esophagitis: Secondary | ICD-10-CM

## 2024-04-02 ENCOUNTER — Other Ambulatory Visit: Payer: Self-pay | Admitting: Internal Medicine

## 2024-04-02 ENCOUNTER — Encounter: Payer: Self-pay | Admitting: Family Medicine

## 2024-04-02 DIAGNOSIS — I10 Essential (primary) hypertension: Secondary | ICD-10-CM

## 2024-04-02 DIAGNOSIS — E039 Hypothyroidism, unspecified: Secondary | ICD-10-CM

## 2024-04-02 DIAGNOSIS — K219 Gastro-esophageal reflux disease without esophagitis: Secondary | ICD-10-CM

## 2024-04-02 MED ORDER — AMLODIPINE BESYLATE 10 MG PO TABS: 10.0000 mg | ORAL_TABLET | Freq: Every day | ORAL | 0 refills | Status: AC

## 2024-04-02 MED ORDER — HYDROCHLOROTHIAZIDE 25 MG PO TABS: 25.0000 mg | ORAL_TABLET | Freq: Every day | ORAL | 0 refills | Status: AC

## 2024-04-02 MED ORDER — OMEPRAZOLE 20 MG PO CPDR: 20.0000 mg | DELAYED_RELEASE_CAPSULE | Freq: Two times a day (BID) | ORAL | 0 refills | Status: AC

## 2024-05-07 ENCOUNTER — Ambulatory Visit: Admitting: Family Medicine
